# Patient Record
Sex: Female | Born: 1970 | Race: White | Hispanic: No
Health system: Midwestern US, Community
[De-identification: ages and names within clinical notes are randomized; demographics above are authoritative.]

## PROBLEM LIST (undated history)

## (undated) DIAGNOSIS — R51 Headache: Secondary | ICD-10-CM

## (undated) DIAGNOSIS — S335XXA Sprain of ligaments of lumbar spine, initial encounter: Secondary | ICD-10-CM

## (undated) DIAGNOSIS — G894 Chronic pain syndrome: Secondary | ICD-10-CM

## (undated) DIAGNOSIS — I10 Essential (primary) hypertension: Secondary | ICD-10-CM

## (undated) DIAGNOSIS — G43909 Migraine, unspecified, not intractable, without status migrainosus: Secondary | ICD-10-CM

## (undated) DIAGNOSIS — M7918 Myalgia, other site: Secondary | ICD-10-CM

## (undated) DIAGNOSIS — G8929 Other chronic pain: Secondary | ICD-10-CM

## (undated) DIAGNOSIS — M26629 Arthralgia of temporomandibular joint, unspecified side: Secondary | ICD-10-CM

## (undated) DIAGNOSIS — R519 Headache, unspecified: Secondary | ICD-10-CM

## (undated) DIAGNOSIS — M542 Cervicalgia: Secondary | ICD-10-CM

## (undated) DIAGNOSIS — M549 Dorsalgia, unspecified: Secondary | ICD-10-CM

## (undated) DIAGNOSIS — E785 Hyperlipidemia, unspecified: Secondary | ICD-10-CM

## (undated) DIAGNOSIS — M47816 Spondylosis without myelopathy or radiculopathy, lumbar region: Secondary | ICD-10-CM

## (undated) HISTORY — PX: KNEE ARTHROSCOPY: SUR90

## (undated) HISTORY — DX: Hyperlipidemia, unspecified: E78.5

## (undated) HISTORY — PX: ULNAR COLLATERAL LIGAMENT RECONSTRUCTION: SHX2593

## (undated) HISTORY — DX: Headache: R51

## (undated) HISTORY — DX: Chronic pain syndrome: G89.4

## (undated) HISTORY — DX: Arthralgia of temporomandibular joint, unspecified side: M26.629

## (undated) HISTORY — DX: Cervicalgia: M54.2

## (undated) HISTORY — DX: Essential (primary) hypertension: I10

## (undated) HISTORY — DX: Other chronic pain: G89.29

## (undated) HISTORY — PX: OTHER SURGICAL HISTORY: SHX169

## (undated) HISTORY — DX: Spondylosis without myelopathy or radiculopathy, lumbar region: M47.816

## (undated) HISTORY — DX: Dorsalgia, unspecified: M54.9

## (undated) HISTORY — DX: Myalgia, other site: M79.18

## (undated) HISTORY — DX: Headache, unspecified: R51.9

## (undated) HISTORY — DX: Sprain of ligaments of lumbar spine, initial encounter: S33.5XXA

## (undated) HISTORY — DX: Migraine, unspecified, not intractable, without status migrainosus: G43.909

---

## 1997-06-01 ENCOUNTER — Emergency Department (HOSPITAL_COMMUNITY): Admission: EM | Admit: 1997-06-01 | Discharge: 1997-06-01 | Payer: Self-pay | Admitting: Emergency Medicine

## 1997-06-02 ENCOUNTER — Emergency Department (HOSPITAL_COMMUNITY): Admission: EM | Admit: 1997-06-02 | Discharge: 1997-06-02 | Payer: Self-pay | Admitting: Emergency Medicine

## 1997-06-16 ENCOUNTER — Other Ambulatory Visit: Admission: RE | Admit: 1997-06-16 | Discharge: 1997-06-16 | Payer: Self-pay | Admitting: Obstetrics and Gynecology

## 1997-07-21 ENCOUNTER — Emergency Department (HOSPITAL_COMMUNITY): Admission: EM | Admit: 1997-07-21 | Discharge: 1997-07-21 | Payer: Self-pay | Admitting: Emergency Medicine

## 1997-09-11 ENCOUNTER — Emergency Department (HOSPITAL_COMMUNITY): Admission: EM | Admit: 1997-09-11 | Discharge: 1997-09-11 | Payer: Self-pay | Admitting: Internal Medicine

## 1997-10-15 ENCOUNTER — Emergency Department (HOSPITAL_COMMUNITY): Admission: EM | Admit: 1997-10-15 | Discharge: 1997-10-15 | Payer: Self-pay | Admitting: Emergency Medicine

## 1997-12-09 ENCOUNTER — Emergency Department (HOSPITAL_COMMUNITY): Admission: EM | Admit: 1997-12-09 | Discharge: 1997-12-09 | Payer: Self-pay | Admitting: Family Medicine

## 1998-01-04 ENCOUNTER — Emergency Department (HOSPITAL_COMMUNITY): Admission: EM | Admit: 1998-01-04 | Discharge: 1998-01-04 | Payer: Self-pay | Admitting: Emergency Medicine

## 1998-05-26 ENCOUNTER — Emergency Department (HOSPITAL_COMMUNITY): Admission: EM | Admit: 1998-05-26 | Discharge: 1998-05-26 | Payer: Self-pay | Admitting: Emergency Medicine

## 1999-06-04 ENCOUNTER — Ambulatory Visit (HOSPITAL_COMMUNITY): Admission: RE | Admit: 1999-06-04 | Discharge: 1999-06-04 | Payer: Self-pay | Admitting: Obstetrics and Gynecology

## 1999-06-14 ENCOUNTER — Encounter: Admission: RE | Admit: 1999-06-14 | Discharge: 1999-09-12 | Payer: Self-pay | Admitting: Anesthesiology

## 1999-08-27 ENCOUNTER — Emergency Department (HOSPITAL_COMMUNITY): Admission: EM | Admit: 1999-08-27 | Discharge: 1999-08-27 | Payer: Self-pay | Admitting: Emergency Medicine

## 1999-08-31 ENCOUNTER — Encounter: Payer: Self-pay | Admitting: Internal Medicine

## 1999-08-31 ENCOUNTER — Encounter: Payer: Self-pay | Admitting: Orthopedic Surgery

## 1999-08-31 ENCOUNTER — Emergency Department (HOSPITAL_COMMUNITY): Admission: EM | Admit: 1999-08-31 | Discharge: 1999-08-31 | Payer: Self-pay | Admitting: Internal Medicine

## 1999-09-04 ENCOUNTER — Emergency Department (HOSPITAL_COMMUNITY): Admission: EM | Admit: 1999-09-04 | Discharge: 1999-09-04 | Payer: Self-pay | Admitting: Emergency Medicine

## 1999-11-24 ENCOUNTER — Emergency Department (HOSPITAL_COMMUNITY): Admission: EM | Admit: 1999-11-24 | Discharge: 1999-11-24 | Payer: Self-pay

## 1999-12-04 ENCOUNTER — Emergency Department (HOSPITAL_COMMUNITY): Admission: EM | Admit: 1999-12-04 | Discharge: 1999-12-04 | Payer: Self-pay | Admitting: Emergency Medicine

## 1999-12-23 ENCOUNTER — Emergency Department (HOSPITAL_COMMUNITY): Admission: EM | Admit: 1999-12-23 | Discharge: 1999-12-23 | Payer: Self-pay | Admitting: Emergency Medicine

## 2000-01-10 ENCOUNTER — Emergency Department (HOSPITAL_COMMUNITY): Admission: EM | Admit: 2000-01-10 | Discharge: 2000-01-10 | Payer: Self-pay | Admitting: Emergency Medicine

## 2000-01-12 ENCOUNTER — Emergency Department (HOSPITAL_COMMUNITY): Admission: EM | Admit: 2000-01-12 | Discharge: 2000-01-12 | Payer: Self-pay | Admitting: Emergency Medicine

## 2000-01-12 ENCOUNTER — Encounter: Payer: Self-pay | Admitting: Emergency Medicine

## 2000-01-17 ENCOUNTER — Emergency Department (HOSPITAL_COMMUNITY): Admission: EM | Admit: 2000-01-17 | Discharge: 2000-01-17 | Payer: Self-pay | Admitting: Emergency Medicine

## 2000-01-22 ENCOUNTER — Observation Stay (HOSPITAL_COMMUNITY): Admission: AD | Admit: 2000-01-22 | Discharge: 2000-01-23 | Payer: Self-pay | Admitting: Obstetrics and Gynecology

## 2000-01-30 ENCOUNTER — Emergency Department (HOSPITAL_COMMUNITY): Admission: EM | Admit: 2000-01-30 | Discharge: 2000-01-30 | Payer: Self-pay | Admitting: Emergency Medicine

## 2000-02-24 ENCOUNTER — Emergency Department (HOSPITAL_COMMUNITY): Admission: EM | Admit: 2000-02-24 | Discharge: 2000-02-24 | Payer: Self-pay | Admitting: *Deleted

## 2000-03-06 ENCOUNTER — Emergency Department (HOSPITAL_COMMUNITY): Admission: EM | Admit: 2000-03-06 | Discharge: 2000-03-06 | Payer: Self-pay

## 2000-03-15 ENCOUNTER — Emergency Department (HOSPITAL_COMMUNITY): Admission: EM | Admit: 2000-03-15 | Discharge: 2000-03-15 | Payer: Self-pay | Admitting: Emergency Medicine

## 2000-03-16 ENCOUNTER — Emergency Department (HOSPITAL_COMMUNITY): Admission: EM | Admit: 2000-03-16 | Discharge: 2000-03-16 | Payer: Self-pay | Admitting: *Deleted

## 2000-04-27 ENCOUNTER — Emergency Department (HOSPITAL_COMMUNITY): Admission: EM | Admit: 2000-04-27 | Discharge: 2000-04-27 | Payer: Self-pay | Admitting: Emergency Medicine

## 2000-05-09 ENCOUNTER — Emergency Department (HOSPITAL_COMMUNITY): Admission: EM | Admit: 2000-05-09 | Discharge: 2000-05-09 | Payer: Self-pay | Admitting: Emergency Medicine

## 2000-05-28 ENCOUNTER — Emergency Department (HOSPITAL_COMMUNITY): Admission: EM | Admit: 2000-05-28 | Discharge: 2000-05-28 | Payer: Self-pay | Admitting: Emergency Medicine

## 2000-06-14 ENCOUNTER — Emergency Department (HOSPITAL_COMMUNITY): Admission: EM | Admit: 2000-06-14 | Discharge: 2000-06-15 | Payer: Self-pay | Admitting: Emergency Medicine

## 2000-12-05 ENCOUNTER — Emergency Department (HOSPITAL_COMMUNITY): Admission: EM | Admit: 2000-12-05 | Discharge: 2000-12-05 | Payer: Self-pay | Admitting: Emergency Medicine

## 2001-01-08 ENCOUNTER — Emergency Department (HOSPITAL_COMMUNITY): Admission: EM | Admit: 2001-01-08 | Discharge: 2001-01-08 | Payer: Self-pay | Admitting: Emergency Medicine

## 2001-01-18 ENCOUNTER — Other Ambulatory Visit: Admission: RE | Admit: 2001-01-18 | Discharge: 2001-01-18 | Payer: Self-pay | Admitting: Obstetrics and Gynecology

## 2002-04-15 ENCOUNTER — Other Ambulatory Visit: Admission: RE | Admit: 2002-04-15 | Discharge: 2002-04-15 | Payer: Self-pay | Admitting: Obstetrics and Gynecology

## 2002-07-28 ENCOUNTER — Emergency Department (HOSPITAL_COMMUNITY): Admission: EM | Admit: 2002-07-28 | Discharge: 2002-07-28 | Payer: Self-pay | Admitting: *Deleted

## 2002-08-27 ENCOUNTER — Emergency Department (HOSPITAL_COMMUNITY): Admission: EM | Admit: 2002-08-27 | Discharge: 2002-08-27 | Payer: Self-pay | Admitting: Emergency Medicine

## 2002-09-18 ENCOUNTER — Emergency Department (HOSPITAL_COMMUNITY): Admission: EM | Admit: 2002-09-18 | Discharge: 2002-09-18 | Payer: Self-pay | Admitting: Emergency Medicine

## 2002-10-31 ENCOUNTER — Encounter
Admission: RE | Admit: 2002-10-31 | Discharge: 2003-01-29 | Payer: Self-pay | Admitting: Physical Medicine & Rehabilitation

## 2002-11-06 ENCOUNTER — Encounter
Admission: RE | Admit: 2002-11-06 | Discharge: 2002-12-05 | Payer: Self-pay | Admitting: Physical Medicine & Rehabilitation

## 2002-12-16 ENCOUNTER — Encounter: Admission: RE | Admit: 2002-12-16 | Discharge: 2003-03-16 | Payer: Self-pay | Admitting: Orthopedic Surgery

## 2003-03-05 ENCOUNTER — Encounter
Admission: RE | Admit: 2003-03-05 | Discharge: 2003-06-03 | Payer: Self-pay | Admitting: Physical Medicine & Rehabilitation

## 2003-06-11 ENCOUNTER — Encounter
Admission: RE | Admit: 2003-06-11 | Discharge: 2003-09-09 | Payer: Self-pay | Admitting: Physical Medicine & Rehabilitation

## 2003-06-30 ENCOUNTER — Encounter
Admission: RE | Admit: 2003-06-30 | Discharge: 2003-09-28 | Payer: Self-pay | Admitting: Physical Medicine & Rehabilitation

## 2003-08-31 ENCOUNTER — Emergency Department (HOSPITAL_COMMUNITY): Admission: EM | Admit: 2003-08-31 | Discharge: 2003-08-31 | Payer: Self-pay | Admitting: Emergency Medicine

## 2003-09-17 ENCOUNTER — Other Ambulatory Visit: Admission: RE | Admit: 2003-09-17 | Discharge: 2003-09-17 | Payer: Self-pay | Admitting: Obstetrics and Gynecology

## 2003-09-29 ENCOUNTER — Encounter
Admission: RE | Admit: 2003-09-29 | Discharge: 2003-12-28 | Payer: Self-pay | Admitting: Physical Medicine & Rehabilitation

## 2003-10-08 ENCOUNTER — Encounter
Admission: RE | Admit: 2003-10-08 | Discharge: 2004-01-06 | Payer: Self-pay | Admitting: Physical Medicine & Rehabilitation

## 2003-10-12 ENCOUNTER — Ambulatory Visit: Payer: Self-pay | Admitting: Physical Medicine & Rehabilitation

## 2003-11-13 ENCOUNTER — Encounter: Admission: RE | Admit: 2003-11-13 | Discharge: 2004-02-11 | Payer: Self-pay | Admitting: Psychology

## 2003-12-20 ENCOUNTER — Emergency Department (HOSPITAL_COMMUNITY): Admission: EM | Admit: 2003-12-20 | Discharge: 2003-12-20 | Payer: Self-pay | Admitting: Emergency Medicine

## 2003-12-22 ENCOUNTER — Emergency Department (HOSPITAL_COMMUNITY): Admission: EM | Admit: 2003-12-22 | Discharge: 2003-12-23 | Payer: Self-pay | Admitting: Emergency Medicine

## 2004-01-12 ENCOUNTER — Encounter
Admission: RE | Admit: 2004-01-12 | Discharge: 2004-04-11 | Payer: Self-pay | Admitting: Physical Medicine & Rehabilitation

## 2004-01-13 ENCOUNTER — Ambulatory Visit: Payer: Self-pay | Admitting: Physical Medicine & Rehabilitation

## 2004-02-12 ENCOUNTER — Emergency Department (HOSPITAL_COMMUNITY): Admission: EM | Admit: 2004-02-12 | Discharge: 2004-02-12 | Payer: Self-pay | Admitting: Emergency Medicine

## 2004-02-23 ENCOUNTER — Emergency Department (HOSPITAL_COMMUNITY): Admission: EM | Admit: 2004-02-23 | Discharge: 2004-02-24 | Payer: Self-pay | Admitting: Emergency Medicine

## 2004-03-08 ENCOUNTER — Ambulatory Visit: Payer: Self-pay | Admitting: Physical Medicine & Rehabilitation

## 2004-04-10 ENCOUNTER — Emergency Department (HOSPITAL_COMMUNITY): Admission: EM | Admit: 2004-04-10 | Discharge: 2004-04-10 | Payer: Self-pay | Admitting: *Deleted

## 2004-05-05 ENCOUNTER — Encounter
Admission: RE | Admit: 2004-05-05 | Discharge: 2004-08-03 | Payer: Self-pay | Admitting: Physical Medicine & Rehabilitation

## 2004-05-10 ENCOUNTER — Ambulatory Visit: Payer: Self-pay | Admitting: Physical Medicine & Rehabilitation

## 2004-05-12 ENCOUNTER — Emergency Department (HOSPITAL_COMMUNITY): Admission: EM | Admit: 2004-05-12 | Discharge: 2004-05-12 | Payer: Self-pay | Admitting: Emergency Medicine

## 2004-05-16 ENCOUNTER — Emergency Department (HOSPITAL_COMMUNITY): Admission: EM | Admit: 2004-05-16 | Discharge: 2004-05-16 | Payer: Self-pay | Admitting: Emergency Medicine

## 2004-06-28 ENCOUNTER — Emergency Department (HOSPITAL_COMMUNITY): Admission: EM | Admit: 2004-06-28 | Discharge: 2004-06-28 | Payer: Self-pay | Admitting: Emergency Medicine

## 2004-07-14 ENCOUNTER — Emergency Department (HOSPITAL_COMMUNITY): Admission: EM | Admit: 2004-07-14 | Discharge: 2004-07-14 | Payer: Self-pay | Admitting: Emergency Medicine

## 2004-07-18 ENCOUNTER — Emergency Department (HOSPITAL_COMMUNITY): Admission: EM | Admit: 2004-07-18 | Discharge: 2004-07-18 | Payer: Self-pay | Admitting: Emergency Medicine

## 2004-08-11 ENCOUNTER — Encounter
Admission: RE | Admit: 2004-08-11 | Discharge: 2004-11-09 | Payer: Self-pay | Admitting: Physical Medicine & Rehabilitation

## 2004-08-11 ENCOUNTER — Ambulatory Visit: Payer: Self-pay | Admitting: Physical Medicine & Rehabilitation

## 2004-08-29 ENCOUNTER — Emergency Department (HOSPITAL_COMMUNITY): Admission: EM | Admit: 2004-08-29 | Discharge: 2004-08-29 | Payer: Self-pay | Admitting: Emergency Medicine

## 2004-09-15 ENCOUNTER — Emergency Department (HOSPITAL_COMMUNITY): Admission: EM | Admit: 2004-09-15 | Discharge: 2004-09-15 | Payer: Self-pay | Admitting: Emergency Medicine

## 2004-09-29 ENCOUNTER — Emergency Department (HOSPITAL_COMMUNITY): Admission: EM | Admit: 2004-09-29 | Discharge: 2004-09-30 | Payer: Self-pay | Admitting: Emergency Medicine

## 2004-10-08 ENCOUNTER — Emergency Department (HOSPITAL_COMMUNITY): Admission: EM | Admit: 2004-10-08 | Discharge: 2004-10-09 | Payer: Self-pay | Admitting: Emergency Medicine

## 2004-10-11 ENCOUNTER — Ambulatory Visit: Payer: Self-pay | Admitting: Physical Medicine & Rehabilitation

## 2005-01-13 ENCOUNTER — Encounter
Admission: RE | Admit: 2005-01-13 | Discharge: 2005-04-13 | Payer: Self-pay | Admitting: Physical Medicine & Rehabilitation

## 2005-01-13 ENCOUNTER — Ambulatory Visit: Payer: Self-pay | Admitting: Physical Medicine & Rehabilitation

## 2005-02-03 ENCOUNTER — Emergency Department (HOSPITAL_COMMUNITY): Admission: EM | Admit: 2005-02-03 | Discharge: 2005-02-04 | Payer: Self-pay | Admitting: Emergency Medicine

## 2005-03-02 ENCOUNTER — Ambulatory Visit: Payer: Self-pay | Admitting: Physical Medicine & Rehabilitation

## 2005-04-20 ENCOUNTER — Ambulatory Visit: Payer: Self-pay | Admitting: Physical Medicine & Rehabilitation

## 2005-04-20 ENCOUNTER — Encounter
Admission: RE | Admit: 2005-04-20 | Discharge: 2005-07-19 | Payer: Self-pay | Admitting: Physical Medicine & Rehabilitation

## 2005-05-23 ENCOUNTER — Ambulatory Visit: Payer: Self-pay | Admitting: Physical Medicine & Rehabilitation

## 2005-05-24 ENCOUNTER — Ambulatory Visit (HOSPITAL_COMMUNITY)
Admission: RE | Admit: 2005-05-24 | Discharge: 2005-05-24 | Payer: Self-pay | Admitting: Physical Medicine & Rehabilitation

## 2005-06-15 ENCOUNTER — Ambulatory Visit: Payer: Self-pay | Admitting: Physical Medicine & Rehabilitation

## 2005-07-18 ENCOUNTER — Ambulatory Visit: Payer: Self-pay | Admitting: Physical Medicine & Rehabilitation

## 2005-08-16 ENCOUNTER — Encounter
Admission: RE | Admit: 2005-08-16 | Discharge: 2005-11-14 | Payer: Self-pay | Admitting: Physical Medicine & Rehabilitation

## 2005-08-16 ENCOUNTER — Ambulatory Visit: Payer: Self-pay | Admitting: Physical Medicine & Rehabilitation

## 2005-08-24 ENCOUNTER — Ambulatory Visit (HOSPITAL_COMMUNITY)
Admission: RE | Admit: 2005-08-24 | Discharge: 2005-08-24 | Payer: Self-pay | Admitting: Physical Medicine & Rehabilitation

## 2005-10-03 ENCOUNTER — Ambulatory Visit: Payer: Self-pay | Admitting: Physical Medicine & Rehabilitation

## 2005-11-30 ENCOUNTER — Encounter
Admission: RE | Admit: 2005-11-30 | Discharge: 2006-02-28 | Payer: Self-pay | Admitting: Physical Medicine & Rehabilitation

## 2005-11-30 ENCOUNTER — Ambulatory Visit: Payer: Self-pay | Admitting: Physical Medicine & Rehabilitation

## 2005-12-09 ENCOUNTER — Emergency Department (HOSPITAL_COMMUNITY): Admission: EM | Admit: 2005-12-09 | Discharge: 2005-12-09 | Payer: Self-pay | Admitting: Emergency Medicine

## 2006-01-24 ENCOUNTER — Ambulatory Visit: Payer: Self-pay | Admitting: Physical Medicine & Rehabilitation

## 2006-02-23 ENCOUNTER — Encounter
Admission: RE | Admit: 2006-02-23 | Discharge: 2006-05-24 | Payer: Self-pay | Admitting: Physical Medicine & Rehabilitation

## 2006-03-27 ENCOUNTER — Ambulatory Visit: Payer: Self-pay | Admitting: Physical Medicine & Rehabilitation

## 2006-05-24 ENCOUNTER — Encounter
Admission: RE | Admit: 2006-05-24 | Discharge: 2006-08-22 | Payer: Self-pay | Admitting: Physical Medicine & Rehabilitation

## 2006-05-24 ENCOUNTER — Ambulatory Visit: Payer: Self-pay | Admitting: Physical Medicine & Rehabilitation

## 2006-07-12 ENCOUNTER — Ambulatory Visit: Payer: Self-pay | Admitting: Physical Medicine & Rehabilitation

## 2006-08-16 ENCOUNTER — Ambulatory Visit: Payer: Self-pay | Admitting: Physical Medicine & Rehabilitation

## 2006-09-12 ENCOUNTER — Encounter
Admission: RE | Admit: 2006-09-12 | Discharge: 2006-12-11 | Payer: Self-pay | Admitting: Physical Medicine & Rehabilitation

## 2006-09-12 ENCOUNTER — Ambulatory Visit: Payer: Self-pay | Admitting: Physical Medicine & Rehabilitation

## 2006-10-29 ENCOUNTER — Ambulatory Visit: Payer: Self-pay | Admitting: Physical Medicine & Rehabilitation

## 2006-12-06 ENCOUNTER — Ambulatory Visit: Payer: Self-pay | Admitting: Physical Medicine & Rehabilitation

## 2006-12-06 ENCOUNTER — Encounter
Admission: RE | Admit: 2006-12-06 | Discharge: 2007-03-06 | Payer: Self-pay | Admitting: Physical Medicine & Rehabilitation

## 2006-12-18 ENCOUNTER — Encounter: Admission: RE | Admit: 2006-12-18 | Discharge: 2006-12-18 | Payer: Self-pay | Admitting: Cardiology

## 2006-12-27 ENCOUNTER — Ambulatory Visit: Payer: Self-pay | Admitting: Physical Medicine & Rehabilitation

## 2007-02-14 ENCOUNTER — Ambulatory Visit: Payer: Self-pay | Admitting: Physical Medicine & Rehabilitation

## 2007-02-14 ENCOUNTER — Encounter
Admission: RE | Admit: 2007-02-14 | Discharge: 2007-05-15 | Payer: Self-pay | Admitting: Physical Medicine & Rehabilitation

## 2007-04-12 ENCOUNTER — Encounter
Admission: RE | Admit: 2007-04-12 | Discharge: 2007-07-11 | Payer: Self-pay | Admitting: Physical Medicine & Rehabilitation

## 2007-04-12 ENCOUNTER — Ambulatory Visit: Payer: Self-pay | Admitting: Physical Medicine & Rehabilitation

## 2007-05-17 ENCOUNTER — Ambulatory Visit: Payer: Self-pay | Admitting: Physical Medicine & Rehabilitation

## 2007-06-12 ENCOUNTER — Ambulatory Visit: Payer: Self-pay | Admitting: Physical Medicine & Rehabilitation

## 2007-07-10 ENCOUNTER — Ambulatory Visit: Payer: Self-pay | Admitting: Physical Medicine & Rehabilitation

## 2007-08-08 ENCOUNTER — Encounter
Admission: RE | Admit: 2007-08-08 | Discharge: 2007-09-27 | Payer: Self-pay | Admitting: Physical Medicine & Rehabilitation

## 2007-08-09 ENCOUNTER — Ambulatory Visit: Payer: Self-pay | Admitting: Physical Medicine & Rehabilitation

## 2007-09-04 ENCOUNTER — Ambulatory Visit: Payer: Self-pay | Admitting: Physical Medicine & Rehabilitation

## 2007-09-27 ENCOUNTER — Ambulatory Visit: Payer: Self-pay | Admitting: Physical Medicine & Rehabilitation

## 2007-10-22 ENCOUNTER — Ambulatory Visit: Payer: Self-pay | Admitting: Physical Medicine & Rehabilitation

## 2007-10-22 ENCOUNTER — Encounter
Admission: RE | Admit: 2007-10-22 | Discharge: 2008-01-20 | Payer: Self-pay | Admitting: Physical Medicine & Rehabilitation

## 2007-11-06 ENCOUNTER — Encounter
Admission: RE | Admit: 2007-11-06 | Discharge: 2007-11-06 | Payer: Self-pay | Admitting: Physical Medicine & Rehabilitation

## 2007-11-12 ENCOUNTER — Ambulatory Visit: Payer: Self-pay | Admitting: Physical Medicine & Rehabilitation

## 2007-12-10 ENCOUNTER — Ambulatory Visit: Payer: Self-pay | Admitting: Physical Medicine & Rehabilitation

## 2008-01-20 ENCOUNTER — Encounter
Admission: RE | Admit: 2008-01-20 | Discharge: 2008-04-19 | Payer: Self-pay | Admitting: Physical Medicine & Rehabilitation

## 2008-01-21 ENCOUNTER — Ambulatory Visit: Payer: Self-pay | Admitting: Physical Medicine & Rehabilitation

## 2008-02-14 ENCOUNTER — Ambulatory Visit: Payer: Self-pay | Admitting: Physical Medicine & Rehabilitation

## 2008-03-13 ENCOUNTER — Ambulatory Visit: Payer: Self-pay | Admitting: Physical Medicine & Rehabilitation

## 2008-04-14 ENCOUNTER — Ambulatory Visit: Payer: Self-pay | Admitting: Physical Medicine & Rehabilitation

## 2008-05-12 ENCOUNTER — Encounter
Admission: RE | Admit: 2008-05-12 | Discharge: 2008-08-10 | Payer: Self-pay | Admitting: Physical Medicine & Rehabilitation

## 2008-05-19 ENCOUNTER — Ambulatory Visit: Payer: Self-pay | Admitting: Physical Medicine & Rehabilitation

## 2008-06-16 ENCOUNTER — Ambulatory Visit: Payer: Self-pay | Admitting: Physical Medicine & Rehabilitation

## 2008-07-15 ENCOUNTER — Ambulatory Visit: Payer: Self-pay | Admitting: Physical Medicine & Rehabilitation

## 2008-08-06 ENCOUNTER — Encounter
Admission: RE | Admit: 2008-08-06 | Discharge: 2008-11-04 | Payer: Self-pay | Admitting: Physical Medicine & Rehabilitation

## 2008-08-10 ENCOUNTER — Ambulatory Visit: Payer: Self-pay | Admitting: Physical Medicine & Rehabilitation

## 2008-09-04 ENCOUNTER — Ambulatory Visit: Payer: Self-pay | Admitting: Physical Medicine & Rehabilitation

## 2008-10-02 ENCOUNTER — Ambulatory Visit: Payer: Self-pay | Admitting: Physical Medicine & Rehabilitation

## 2008-10-27 ENCOUNTER — Ambulatory Visit: Payer: Self-pay | Admitting: Physical Medicine & Rehabilitation

## 2008-12-23 ENCOUNTER — Encounter
Admission: RE | Admit: 2008-12-23 | Discharge: 2009-01-20 | Payer: Self-pay | Admitting: Physical Medicine & Rehabilitation

## 2008-12-23 ENCOUNTER — Ambulatory Visit: Payer: Self-pay | Admitting: Physical Medicine & Rehabilitation

## 2009-01-20 ENCOUNTER — Ambulatory Visit: Payer: Self-pay | Admitting: Physical Medicine & Rehabilitation

## 2009-02-18 ENCOUNTER — Encounter
Admission: RE | Admit: 2009-02-18 | Discharge: 2009-05-13 | Payer: Self-pay | Admitting: Physical Medicine & Rehabilitation

## 2009-02-19 ENCOUNTER — Ambulatory Visit: Payer: Self-pay | Admitting: Physical Medicine & Rehabilitation

## 2009-03-11 ENCOUNTER — Ambulatory Visit: Payer: Self-pay | Admitting: Physical Medicine & Rehabilitation

## 2009-04-21 ENCOUNTER — Ambulatory Visit: Payer: Self-pay | Admitting: Physical Medicine & Rehabilitation

## 2009-05-13 ENCOUNTER — Encounter
Admission: RE | Admit: 2009-05-13 | Discharge: 2009-05-20 | Payer: Self-pay | Source: Home / Self Care | Admitting: Physical Medicine & Rehabilitation

## 2009-05-20 ENCOUNTER — Ambulatory Visit: Payer: Self-pay | Admitting: Physical Medicine & Rehabilitation

## 2009-08-10 ENCOUNTER — Encounter
Admission: RE | Admit: 2009-08-10 | Discharge: 2009-11-08 | Payer: Self-pay | Admitting: Physical Medicine & Rehabilitation

## 2009-08-19 ENCOUNTER — Ambulatory Visit: Payer: Self-pay | Admitting: Physical Medicine & Rehabilitation

## 2009-09-29 ENCOUNTER — Ambulatory Visit: Payer: Self-pay | Admitting: Physical Medicine & Rehabilitation

## 2009-10-27 ENCOUNTER — Ambulatory Visit: Payer: Self-pay | Admitting: Physical Medicine & Rehabilitation

## 2009-12-06 ENCOUNTER — Encounter
Admission: RE | Admit: 2009-12-06 | Discharge: 2009-12-09 | Payer: Self-pay | Source: Home / Self Care | Attending: Physical Medicine & Rehabilitation | Admitting: Physical Medicine & Rehabilitation

## 2009-12-09 ENCOUNTER — Ambulatory Visit: Payer: Self-pay | Admitting: Physical Medicine & Rehabilitation

## 2010-02-11 ENCOUNTER — Encounter
Admission: RE | Admit: 2010-02-11 | Discharge: 2010-02-22 | Payer: Self-pay | Source: Home / Self Care | Attending: Physical Medicine & Rehabilitation | Admitting: Physical Medicine & Rehabilitation

## 2010-02-18 ENCOUNTER — Ambulatory Visit: Admit: 2010-02-18 | Payer: Self-pay | Admitting: Physical Medicine & Rehabilitation

## 2010-03-07 ENCOUNTER — Ambulatory Visit (HOSPITAL_BASED_OUTPATIENT_CLINIC_OR_DEPARTMENT_OTHER): Payer: 59 | Admitting: Physical Medicine & Rehabilitation

## 2010-03-07 ENCOUNTER — Encounter: Payer: 59 | Attending: Physical Medicine & Rehabilitation

## 2010-03-07 DIAGNOSIS — M51379 Other intervertebral disc degeneration, lumbosacral region without mention of lumbar back pain or lower extremity pain: Secondary | ICD-10-CM | POA: Insufficient documentation

## 2010-03-07 DIAGNOSIS — F329 Major depressive disorder, single episode, unspecified: Secondary | ICD-10-CM | POA: Insufficient documentation

## 2010-03-07 DIAGNOSIS — M549 Dorsalgia, unspecified: Secondary | ICD-10-CM | POA: Insufficient documentation

## 2010-03-07 DIAGNOSIS — M5380 Other specified dorsopathies, site unspecified: Secondary | ICD-10-CM

## 2010-03-07 DIAGNOSIS — M5137 Other intervertebral disc degeneration, lumbosacral region: Secondary | ICD-10-CM | POA: Insufficient documentation

## 2010-03-07 DIAGNOSIS — IMO0001 Reserved for inherently not codable concepts without codable children: Secondary | ICD-10-CM

## 2010-03-07 DIAGNOSIS — M26609 Unspecified temporomandibular joint disorder, unspecified side: Secondary | ICD-10-CM | POA: Insufficient documentation

## 2010-03-07 DIAGNOSIS — M25569 Pain in unspecified knee: Secondary | ICD-10-CM | POA: Insufficient documentation

## 2010-03-07 DIAGNOSIS — IMO0002 Reserved for concepts with insufficient information to code with codable children: Secondary | ICD-10-CM

## 2010-03-07 DIAGNOSIS — G43909 Migraine, unspecified, not intractable, without status migrainosus: Secondary | ICD-10-CM | POA: Insufficient documentation

## 2010-03-07 DIAGNOSIS — M1710 Unilateral primary osteoarthritis, unspecified knee: Secondary | ICD-10-CM

## 2010-03-07 DIAGNOSIS — M542 Cervicalgia: Secondary | ICD-10-CM | POA: Insufficient documentation

## 2010-03-07 DIAGNOSIS — F3289 Other specified depressive episodes: Secondary | ICD-10-CM | POA: Insufficient documentation

## 2010-04-15 NOTE — Assessment & Plan Note (Signed)
Shannon Meadows is back regarding her multiple pain complaints.  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  She is having pain in her left knee and pain in the back radiating into the left leg.  We had done some medial branch blocks in the past as well as an epidural injection and these have been helpful for her.  Left knee is bothering her when she walks as does the back.  Sleep has been a problem.  Her TMJs are bothering her as well.  REVIEW OF SYSTEMS:  Notable for multiple issues including spasms and some dizziness.  She has worked on some weight loss.  She reports nausea, some diarrhea, constipation, and abdominal pain.  Full 12-point review is in the written health history section and chart.  SOCIAL HISTORY:  As noted above.  She is single and living alone, currently working in [place].  PHYSICAL EXAMINATION:  VITAL SIGNS:  Blood pressure is 142/68, pulse 62, and respiratory rate 18.  She is saturating 98% on room air. GENERAL:  The patient is pleasant, alert, and oriented x3.  She has some pain in the low back with pressure along both PSIS areas.  She has more pain with extension and flexion.  Lateral bending and twisting cause some discomfort.  Straight leg testing was positive today on the left, negative on the right.  She does have some hamstring tightness. Patrick's test and Gaenslen's test were negative.  Left knee is painful with passive range of motion with some crepitus noted.  Difficult to assess the swelling today due to her obesity. HEART:  Regular. CHEST:  Clear. ABDOMEN:  Soft and nontender. NEURO:  The patient is alert and appropriate.  ASSESSMENT: 1. Chronic migraine headaches with cervicogenic disk component and     dystonia. 2. Temporomandibular joint dysfunction. 3. Cervical myofascial pain. 4. 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 5. Osteoarthritis of the knee, left greater than right. 6. Morbid obesity. 7. Depression.  PLAN: 1.  Refilled medications today including fentanyl, oxycodone, and     Dilaudid #10, 90, and 6 respectively.  She will be allowed to have     her father pick up prescriptions next month and I will see her back     in about 2 month's for refills. 2. We will find most recent MRI of her back.  We will look at     performing followup injections either medial branch blocks or     epidural steroid injection. 3. Continue weight loss efforts and exercise intolerance. 4. After informed consent, we injected the left knee via anterolateral     approach with 40 mg of Kenalog and 3 mL of 1% lidocaine.     Initially, we injected 2 trigger points in the cervical trapezius     region on the right.     Ranelle Oyster, M.D. Electronically Signed   ZTS/MedQ D:  03/07/2010 11:59:44  T:  03/07/2010 20:21:01  Job #:  [id number]

## 2010-05-05 ENCOUNTER — Encounter: Payer: 59 | Attending: Physical Medicine & Rehabilitation

## 2010-05-05 DIAGNOSIS — G43909 Migraine, unspecified, not intractable, without status migrainosus: Secondary | ICD-10-CM | POA: Insufficient documentation

## 2010-05-05 DIAGNOSIS — M5380 Other specified dorsopathies, site unspecified: Secondary | ICD-10-CM

## 2010-05-05 DIAGNOSIS — G43019 Migraine without aura, intractable, without status migrainosus: Secondary | ICD-10-CM

## 2010-05-05 DIAGNOSIS — F3289 Other specified depressive episodes: Secondary | ICD-10-CM | POA: Insufficient documentation

## 2010-05-05 DIAGNOSIS — M51379 Other intervertebral disc degeneration, lumbosacral region without mention of lumbar back pain or lower extremity pain: Secondary | ICD-10-CM | POA: Insufficient documentation

## 2010-05-05 DIAGNOSIS — M26609 Unspecified temporomandibular joint disorder, unspecified side: Secondary | ICD-10-CM | POA: Insufficient documentation

## 2010-05-05 DIAGNOSIS — M545 Low back pain, unspecified: Secondary | ICD-10-CM

## 2010-05-05 DIAGNOSIS — Q789 Osteochondrodysplasia, unspecified: Secondary | ICD-10-CM

## 2010-05-05 DIAGNOSIS — F329 Major depressive disorder, single episode, unspecified: Secondary | ICD-10-CM | POA: Insufficient documentation

## 2010-05-05 DIAGNOSIS — IMO0001 Reserved for inherently not codable concepts without codable children: Secondary | ICD-10-CM | POA: Insufficient documentation

## 2010-05-05 DIAGNOSIS — M5137 Other intervertebral disc degeneration, lumbosacral region: Secondary | ICD-10-CM | POA: Insufficient documentation

## 2010-05-05 DIAGNOSIS — M1710 Unilateral primary osteoarthritis, unspecified knee: Secondary | ICD-10-CM | POA: Insufficient documentation

## 2010-05-05 DIAGNOSIS — IMO0002 Reserved for concepts with insufficient information to code with codable children: Secondary | ICD-10-CM | POA: Insufficient documentation

## 2010-06-07 NOTE — Assessment & Plan Note (Signed)
Shannon Meadows is back regarding her multiple pain issues.  She has seen Dr.  Brynda Rim in [place] recently after she fell and dislocated her elbow.  She may have had some slight humeral fractures as well.  The care has  been conservative at this point and she feels as if she is improving.  Her left knee has done better since we injected it.  She was using the  brace for a while until she hurt her elbow.  Apparently, she fell after  getting up from the bed one morning.  Pain is an 8/10.  She described  the pain as sharp, stabbing, intermittent, and tingling.  Pain  interferes with general activity, in relations with others, and  enjoyment of life on a moderate-to-severe level.  Sleep is poor, but  varies.  She complains of increased headache recently, particularly  emanating from the right neck.   REVIEW OF SYSTEMS:  Notable for spasm, heavy bleeding, nausea, diarrhea,  constipation, poor appetite, and limb swelling.  Other pertinent  positives are above and full review is in the written health and history  section of the chart.   SOCIAL HISTORY:  The patient is single.  Living alone.  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   PHYSICAL EXAMINATION:  VITAL SIGNS:  Blood pressure 144/82, pulse 81,  respiratory rate 26, and she is sating 96% on room air.  GENERAL:  The patient is pleasant, alert, oriented x3.  She is less  antalgic to left knee, but seems to be walking better today.  Weight is  unchanged.  Left elbow is notable for multiple ecchymoses and bruises.  She has some flexion, but much less than full extension of her elbow  today.  I did not examine this in detail.  Sensory exam is grossly  intact.  Knee is still with mild effusion.  The patient has tightness  along the upper cervical paraspinal/trapezius muscles on the right side  when press reproduced her pain.  It seemed to be worse with flexion and  extension.  HEART:  Regular rate.  CHEST:  Clear.  ABDOMEN:  Soft and  nontender.  NEURO:  Cognitively, she is intact with good insight and awareness.   ASSESSMENT:  1. Chronic migraine headaches with cervicogenic component.  2. Chronic temporomandibular joint pain.  3. Cervical myofascial pain.  4. Lumbar facet disease with radiculopathy.  5. Left knee osteoarthritis and medial meniscal injury.  6. Plantar fasciitis with fibroma.  7. History of depression.  8. Bradycardia.   PLAN:  1. Follow up with Orthopedics regarding recent fall and also about      left knee issues.  I would expect that they will choose a      conservative course at this point until symptoms recur.  Encouraged      weight loss, knee brace wear as able.  2. Injected right cervical paraspinals and upper trapezius x2 with 2      mL of 1% lidocaine trigger point injection.  The patient tolerated      it well.  3. Refilled the patient's pain medications including Fentora lozenges,      #10, 200 mcg; oxycodone 5 mg 1 q.6 h. p.r.n., #90; and fentanyl      patch 100 mcg q.72 h., #10.  4. I released the patient to go back to work on December 27, 2007, if      okay by Orthopedic Surgery as well.  5. We will see her  back in 4-6 weeks in the Nursing Clinic and in 8-10      weeks with me.  UDS was collected today.      Ranelle Oyster, M.D.  Electronically Signed     ZTS/MedQ  D:  12/10/2007 12:50:26  T:  12/11/2007 03:35:32  Job #:  [id number]

## 2010-06-07 NOTE — Assessment & Plan Note (Signed)
HISTORY OF PRESENT ILLNESS:  Shannon Meadows is back regarding her multiple pain  complaints.  I last saw her in December.  Since I last saw her, her  cardiologist diagnosed her with hypoventilation syndrome after she  finally agreed to go for a sleep study.  She is using oxygen at  nighttime now.  She claims that oxygen is making her room form and she  has problems sleeping as a result.  But, in general, she feels that she  has a bit more energy.  Her left knee is giving her problems as well as  her right foot.  She lost her walking boot that she had been given last  year.  She remains on her other medications for pain including Fentanyl,  oxycodone, Fentora.  She is still having work done on her teeth and  plans to see a dentist for follow up in this regard over the next  several months.   REVIEW OF SYSTEMS:  Notable for tremor, tingling, spasms, pain in the  left knee, potential bowel and bladder symptoms, poor appetite,  coughing, breathing and sleeping issues as noted above.   SOCIAL HISTORY:  The patient is single, working full-time as a Engineer, civil (consulting) in  [employer].   PHYSICAL EXAMINATION:  VITAL SIGNS:  Blood pressure 120/75, pulse 80,  respiratory rate 15, saturating 97% on room air.  GENERAL:  The patient's color was noticeably better today.  She is much  pinker than in the past.  Affect is bright and appropriate.  She remains  overweight.  EXTREMITIES:  She is antalgic on the left knee.  There is a mild  effusion noted at the left knee, but nothing traumatic.  She has some  crepitus with some extension, flexion and meniscal maneuvers also.  Positive for medial aspect more than the lateral.  Right foot is notable  for some tenderness along with the metatarsals.  Once again, no swelling  or edema is noted.  NECK/SHOULDERS:  Notable for trigger points in the right upper cervical  paraspinal as well as the trapezius areas right and left.  Motor exam is  stable.  Sensory exam is intact.  Low back  is still somewhat painful  with flexion and extension.  HEART:  Regular.  CHEST:  Clear.   ASSESSMENT:  1. Chronic migraine headaches with cervicogenic component.  2. Chronic TMJ pain.  3. Cervical myofascial pain.  4. Lumbar facet disease with disk disease and radiculopathy as well.  5. History of third and fourth metatarsal stress fractures on the      right.  6. History of depression.  7. History of osteoarthritis bilaterally of the knees, left greater      than right.  8. Obesity.  9. Plantar fasciitis.  10.Bradycardia and hypertension.   PLAN:  1. Through informed consent, we injected the left knee with 40 mg of      Kenalog and 3 cc of 1% lidocaine.  The patient tolerated well.  2. I also injected the left trapezius and right splenius capitus with      2 cc of 1% lidocaine.  The patient tolerated well.  3. Continue oxygen and improved sleep hygiene as above.  4. Refill OxyContin, Fentora and Duragesic patches today.  5. Send patient back to [facility] for another walking boot which she      needs to use when her foot flares up.  6. Improve diet and exercise.  The patient did have a sugared soft  drink with her today which she needs to try to      avoid.  7. I will see her back in three months time with nurse clinic follow      up in one month.      Ranelle Oyster, M.D.  Electronically Signed     ZTS/MedQ  D:  05/17/2007 12:40:44  T:  05/17/2007 13:09:31  Job #:  [id number]   cc:   Dr. Idamae Schuller, M.D.  Fax: [phone]  Email: [email or web address]

## 2010-06-07 NOTE — Assessment & Plan Note (Signed)
Shannon Meadows is back regarding her multiple pain complaints.  She did well with  the medial branch blocks at L3, L4, and L5 on the right.  She is  complaining of some myofascial pain again in her familiar areas along  the neck and shoulder, right more than left.  She has been struggling  with a head cold recently.  She saw Dr. Donnie Aho regarding her bradycardia  and Holter monitor was placed, apparently, although she did not tolerate  the adhesives very well.  Still complains of poor sleep.  She asks why  she is getting sick and tired all the time.   The patient remains on her same pain medications, including:  1. The Fentanyl patch 100 mcg q.72h.  2. Oxycodone 5 mg 1 q.6h p.r.n.  3. Fentora 200 mcg 1 to 2 q. week p.r.n. breakthrough pain.  4. Lyrica twice daily.   REVIEW OF SYSTEMS:  Notable for numbness, tremor, tingling, trouble  walking, spasms.  She has some easy bleeding, diarrhea, vomiting,  nausea, poor appetite, coughing, respiratory symptoms as noted above.   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   PHYSICAL EXAM:  Blood pressure 133/65, pulse 87, respiratory rate 18,  she is satting 96% on room air.  The patient is pleasant, alert and oriented x3.  Affect is generally  bright and appropriate.  She remains overweight.  Coordination is fair.  She has familiar trigger points along the upper  trapezius at the occiput, right more than left.  She also has some pain  along the medial sternocleidomastoid and the medial lateral trapezius on  the right.  Motor sensory exam is normal in the upper extremities today.  Low back exam reveals some pain with flexion and extension, but better  movement overall today.  HEART:  Regular rate.  CHEST:  Clear.   ASSESSMENT:  1. Chronic migraine headaches with cervicogenic component.  2. Chronic temporomandibular joint pain.  3. Cervical myofascial pain.  4. Lumbar facet disease with a history of lumbar  disk disease and      radiculopathy.  5. History of third and fourth metatarsal stress fractures.  6. History of depression.  7. History of osteoarthritis of the left knee and possibly the right.  8. Morbid obesity.  9. Plantar fasciitis.  10.Bradycardia and hypertension.   PLAN:  1. After informed consent, we injected the right trapezius, right      sternocleidomastoid, and right suboccipital areas using 2 mL 1%      lidocaine.  2. I urged the patient to follow through with her sleep study.  I will      not see her back again unless she does so.  3. Refilled OxyContin, Fentora, and Duragesic patches today.  4. Encouraged her to exercise and strengthening, as well as      appropriate diet.  5. I will see her back in about 3 months with nurse clinic followup in      1 month's time.      Ranelle Oyster, M.D.  Electronically Signed     ZTS/MedQ  D:  01/22/2007 12:53:23  T:  01/22/2007 13:11:20  Job #:  [id number]   cc:   Derrill Center, M.D.

## 2010-06-07 NOTE — Assessment & Plan Note (Signed)
Shannon Meadows is back regarding her multiple pain complaints.  She notes her  knee has been acting up a bit recently.  Her main complaints are  occasional headaches and pain in the shoulders.  She has been diagnosed  as a diabetic and has lost weight down to help accommodate for that.  She has also been exercising more.  She is working on [employer].  She has missed her pool since she has been in [place] and looking  for local place to swim as she is taking this up now.  The patient rates  her pain 8-9/10 and described as sharp, dull, stabbing, and aching at  times.  Pain interferes with general activity, relations with others,  enjoyment of life on a moderate level.  Sleep is fair.   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  She is single, but living with her cat at home.   REVIEW OF SYSTEMS:  Notable for weakness, tingling, dizziness, spasms,  depression, high sugars, nausea, vomiting, diarrhea, abdominal pain, and  limb swelling.  Other pertinent positives are above.  Full 14-point  review is in the written health and history section of the chart.   PHYSICAL EXAMINATION:  VITAL SIGNS:  Blood pressure 135/52, pulse is 87,  and respiratory rate 18.  She is sating 95% on room air.  GENERAL:  Weight appears stable.  She is minimally antalgic on the left  knee with gait today.  She continues to have pain in the neck  particularly with flexion, extension, and notable trigger points in  bilateral mid trapezius as well as the right upper trapezius and  splenius capitis region.  She has clicking in bilateral jaws and TMJs.  NEUROLOGIC:  Cognition was good as well as insight awareness, etc.  HEART:  Regular.  CHEST:  Clear.  ABDOMEN:  Soft, nontender.   ASSESSMENT:  1. Chronic migraine headaches with cervicogenic component.  2. Temporomandibular joint dysfunction.  3. Cervical myofascial pain.  4. Lumbar facet disease with radiculopathy.  5. Osteoarthritis of bilateral knees.  6. Plantar fasciitis with fibroma on the feet.  7. History of depression.   PLAN:  1. After informed consent, we injected 3 trigger points in the right      splenius capitis and bilateral trapezius muscles each with 2 mL of      1% lidocaine.  The patient tolerated well.  2. It was too really to repeat any injections on her left knee.  I      really would refrain from another 3-6 months before doing any      further steroid injection.  She needs to work on weight loss,      strengthening, and exercise activity.  I tried to impress that upon      her today.  3. I refilled Fentora patches, #10, Oxycodone 5, #90, and Fentanyl      patch 100 mcg, #10.      Ranelle Oyster, M.D.  Electronically Signed     ZTS/MedQ  D:  08/10/2008 13:42:47  T:  08/11/2008 03:10:04  Job #:  [id number]

## 2010-06-07 NOTE — Assessment & Plan Note (Signed)
Shannon Meadows is back regarding her knee pain in particular.  She rates it 9/10.  She has had no relief with rest.  She was waiting for her brace and  apparently it is not ready.  She has been out of work.  She showed no  improvement, therefore we ordered an MRI of her left knee, which showed  moderate medial compartment degenerative changes with tear of the  posterior horn of the medial meniscus at the meniscal root, minor  meniscal exclusion, and MCL degeneration.  Other knee ligaments were  intact.  She has small joint effusion.  A referral to Dr. Darrelyn Meadows was  made per the patient's request.  The patient states that she is sleeping  poor.  She has had return of her neck symptoms, and she has been sitting  a lot and not up on her feet.  She describes the patient is  intermittent, dull, stabbing, aching, and constant.  Pain interferes  with general activity, in relationship with others, and enjoyment of  life on a moderate level.   REVIEW OF SYSTEMS:  Notable for the above as well as numbness, tingling,  dizziness, spasms, trouble walking, fever, easy bleeding, gout,  diarrhea, constipation, poor appetite, respiratory infection, and limb  swelling.  Other pertinent positives are above.  Full review is in the  written health and history section of the chart.   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   PHYSICAL EXAMINATION:  VITAL SIGNS:  Blood pressure is 124/82, pulse is  88, respiratory rate 16, and she is sating 98% on room air.  GENERAL:  The patient is pleasant, alert, and oriented x3.  She remains  antalgic on the left knee.  She seems to be morbidly obese.  NECK:  Tender, particularly in the cervical paraspinals and mid-  trapezius regions.  Strength is stable except for inhibition to left  knee.  She had positive meniscal sign to left knee and pain with valgus  stress to the left knee.  Sensory exam is intact.  She has mild joint  effusion on the left  knee.  HEART:  Regular.  CHEST:  Clear.  ABDOMEN:  Soft and nontender.   ASSESSMENT:  1. Chronic migraine headaches with cervicogenic component.  2. Chronic temporomandibular joint pain.  3. Cervical myofascial pain.  4. Lumbar facet disease with radiculopathy.  5. Left knee osteoarthritis and medial meniscal injury.  6. Plantar fasciitis and fibroma.  7. History of depression.  8. History of bradycardia.   PLAN:  1. Continue Mobic for now.  2. After informed consent, we injected via medial approach 40 mg of      Kenalog and 3 cc of 1% lidocaine into the knee.  The patient      tolerated it well.  3. Await fitting of brace and Orthopedic followup.  4. Refilled oxycodone, fentanyl patch, and Fentora, post dated November 23, 2007.  5. I will see her back in about a month's time.  Check UDS at that      visit.      Ranelle Oyster, M.D.  Electronically Signed     ZTS/MedQ  D:  11/12/2007 12:39:01  T:  11/13/2007 01:13:41  Job #:  [id number]   cc:   Windy Fast A. Shannon Meadows, M.D.  Fax: [phone]

## 2010-06-07 NOTE — Assessment & Plan Note (Signed)
FOLLOWUP OFFICE VISIT   Kimble is back for follow-up of her pain.  She has had low back pain  recently after sleeping poorly on a soft bed about a week to a week and  a half ago.  She rates her pain an 8-9/10.  She describes it as sharp,  intermittent, stabbing at times.  Pain interferes with general activity,  relationships with others and enjoyment of life on a moderate level.  Feet have been stable.  Left knee has been bit better since we injected  it.  She has had some more TMJ pain on the left as well as some shoulder  discomfort.   SOME MEDICATIONS:  1. Duragesic 100 mcg q 72 hours,  #10.  2. Oxycodone 5 mg 1 q6 hours, #90.  3. Fentora 200 mg 1-2 q week p.r.n.  Patient states that she likes Lyrica as it has helped her migraine  substantially.   SOCIAL HISTORY:  Patient is single, living alone.  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   REVIEW OF SYSTEMS:  Notable for trouble walking, spasms, easy bleeding,  no appetite, nausea, fever.   PHYSICAL EXAMINATION:  VITAL SIGNS:  Blood pressure 123/67, pulse 75,  respirations 18.  She is saturating 96% on room air.  GENERAL:  Patient is pleasant, alert and oriented x3.  Affect is bright.  Gait is generally stable.  Left knee is minimally tender and swollen  today.  She had some pain over the left TMJ which was notable for  clicking as well.  She had notable tender points over the right  trapezius in multiple spots.  Cognition was appropriate.  Weight stable.  HEART:  Regular.  CHEST:  Clear.  ABDOMEN:  Soft, nontender.  LOW BACK:  Tender, somewhat, along the lumbar paraspinals particularly  at the L5-S1 segment.   ASSESSMENT:  1. Chronic migraines with cervicogenic component.  2. TMJ pain.  3. Cervical myofascial pain.  4. Lumbar facets disease.  5. History of third and fourth metatarsal fractures due to stress      reaction.  6. Depression.  7. Left knee osteoarthritis.  8. Plantar  fasciitis bilaterally.   PLAN:  1. Inject the left TMJ with 30 mg of Kenalog and 2cc 1% Lidocaine.  2. Inject multiple trigger points along the right trapezius using 1%      Lidocaine in 1-2cc increments.  3. Refilled Fentora, Duragesic, and Oxycodone today.  4. Patient needs to have a 3D boot fitted for p.r.n. use on the left      leg.  5. Encourage appropriate weight, shoe wear and diet measures.  6. I will see her back in 3 months with nurse clinic follow-up in 1      month.      Ranelle Oyster, M.D.  Electronically Signed     ZTS/MedQ  D:  08/17/2006 16:32:07  T:  08/18/2006 13:51:54  Job #:  [id number]

## 2010-06-07 NOTE — Procedures (Signed)
Shannon Meadows, Shannon Meadows                  ACCOUNT NO.:  [id number]   MEDICAL RECORD NO.:  [id number]          PATIENT TYPE:  REC   LOCATION:  [facility]                          FACILITY:  [facility]   PHYSICIAN:  Erick Colace, M.D.DATE OF BIRTH:  Jul 05, 1970   DATE OF PROCEDURE:  12/07/2006  DATE OF DISCHARGE:                               OPERATIVE REPORT   A 40 year old female with lumbar facet mediated pain.  She has had  prolonged relief from prior lumbar facet medial branch blocks in 2005.  She has had recurrence of right sided low back pain rated at 8/10  interfering with walking and bending and prolonged standing.  She  continues to work 40 hours a week.   PROCEDURE:  Right L5 dorsal ramus injection.  Right L4 medial branch  block.  Right L3 medial branch block.  Right L2 medial branch block  under fluoroscopic guidance.   INDICATIONS:  See above.  Informed consent was obtained after describing  the risks and benefits of the procedure to the patient include bleeding,  bruising, infection, temporary permanent paralysis.  She elects to  proceed and has given written consent.  The patient was placed prone on  the fluoroscopy table.  Betadine prepped and sterile draped.  A 25-gauge  inch and half needle was used to anesthetize skin and subcutaneous  tissue with 1% lidocaine x2 ccs and then a 22 gauge 5 inch spinal needle  was inserted under fluoroscopic guidance starting the right S1 SAP  sacral ala junction.  Bone contact made confirmed with lateral imaging.  Omnipaque 180 x 0.5 cc demonstrated no intravascular uptake of 0.75 cc  of solution containing 1 cc of 4 mg/cc dexamethasone and 2 cc of 2% MPF  lidocaine were injected.  The right L5 SAP transverse process junction  targeted.  Bone contact made confirmed with lateral imaging.  Omnipaque  180 x 0.5 cc demonstrated no intravascular uptake and 0.75 cc of  dexamethasone lidocaine solution injected.  Then the right L4 SAP  transverse  process junction targeted.  Bone contact made confirmed with  lateral imaging.  Omnipaque 180 x 0.5 cc demonstrated no intravascular  uptake and 0.75 cc of dexamethasone lidocaine solution injected.  Then  the right L3 SAP transverse process junction targeted.  Bone contact  made confirmed with lateral imaging.  Omnipaque 180 x 0.5 cc  demonstrated no intravascular uptake and 0.75 cc of dexamethasone  lidocaine solution was injected.  The patient tolerated the procedure  well.  Pre and post injection vitals stable.  Post injection  instructions given.  She will follow up with Dr. Riley Kill.      Erick Colace, M.D.  Electronically Signed     AEK/MEDQ  D:  12/07/2006 14:51:36  T:  12/08/2006 19:59:40  Job:  [id number]

## 2010-06-07 NOTE — Assessment & Plan Note (Signed)
Shannon Meadows is back regarding her pain.  She saw her PCP ultimately regarding  her bradycardia and hypertension and was referred to cardiology.  She  was not happy with her workup there.  Apparently she wore a halter  monitor for a period of time.  She was placed on lisinopril and HCTZ for  her blood pressure and no other plan was given except for recommendation  of sleep apnea study.  Her right knee also began to swell.  She saw  orthopedic surgery locally and x-rays performed which showed some  arthritis left greater than right.  MRIs were ordered and done recently  with results pending.  Apparently some fluid was drained from her right  knee.  She continues to have pain with both knees.  She has familiar  myofascial neck pain as well.  Headaches have been up and down.  Back  continues to both her in particular standing and walking.  She has some  pain radiating to the legs but the back has the most prominent symptom.  We had injected her back 2 or 3 years ago here at the office.  I believe  she had facet blocks at that time.  She remains on Duragesic patch as  well as oxycodone and Fentora for breakthrough pain.  She has some  dizziness still while she is up and moving at work.   The patient rates her pain 8-9 out of 10 and describes as intermittent,  constant, dull, stabbing, sharp.  Pain interferes with her general  activity, relationship with others, enjoyment of life on moderate level.  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   REVIEW OF SYSTEMS:  Notable for confusion, some depression and spasms,  numbness, pain in her knee, right greater than left.  Poor appetite,  diarrhea, nausea, vomiting, easy bleeding.   SOCIAL HISTORY:  The patient is single and working as noted above.  She  does not smoke.   PHYSICAL EXAMINATION:  VITALS:  Blood pressure is 173/94, pulse is 73,  respiratory rate 18. She is satting 100% on room air.  GENERAL:  The patient is generally pleasant,  no acute distress.  EXTREMITIES:  She walks with antalgic gait favoring her right side.  There is some mild peripatellar swelling in the right knee.  Less  swelling is seen on the left side.  She has trigger points in the left  trapezius and right trapezius muscles as well as the right splenius  capitus areas.  Back painful for facet maneuvers and forced extension.  She had less pain with flexion.  Strength was generally 5/5 in both legs  with intact reflexes and century function.  CHEST:  Clear.  HEART:  Regular.  ABDOMEN:  Soft, nontender.  She remains overweight.   ASSESSMENT:  1. Chronic migraine headaches with cervicogenic component.  2. Chronic TMJ pain.  3. Cervical myofascial pain.  4. Lumbar facet disease with history of lumbar disk disease and      radiculopathy.  I believe her lumbar facet disease is more of a      cause of her acute back pain at this point.  5. History of third and fourth metatarsal stress fractures.  6. History of depression.  7. History of osteoarthritis of left knee as well as potentially right      knee.  8. Morbid obesity.  9. Plantar fasciatus.  10.Bradycardia and hypertension.   PLAN:  1. After informed consent we injected bilateral trapezius  muscles in      the right splenius capitus using 2 cc's 1% lidocaine.  The patient      tolerated well.  2. Will make a referral to Dr. Viann Fish who the patient is      familiar with for an evaluation of the patient's bradycardia and      hypertension.  She very well may need a sleep study although apnea      wouldn't account for bradycardia she has experienced during the      day.  I did recommend that she go back on lisinopril and HCTZ for      now as these will not effect her heart rate.  She can discuss      further medication with Dr. Donnie Aho at which point she sees him.  3. Continue Fentora, oxycodone, Duragesic for pain control.  4. Orthopedic follow-up per local team in [place].  5. I will  see her back in 1-2 months time.  Will first send her to Dr.      Wynn Banker for medial branch blocks at L3/L4, L4/L5, L5/S1 on the      right side.      Ranelle Oyster, M.D.  Electronically Signed     ZTS/MedQ  D:  11/27/2006 13:39:32  T:  11/28/2006 07:57:47  Job #:  [id number]   cc:   Derrill Center, MD

## 2010-06-07 NOTE — Assessment & Plan Note (Signed)
Shannon Meadows is back regarding her multiple pain issues.  After I saw her in  July, she has had problems with the left knee with it giving out.  She  has seen an orthopedist locally several times, who states that she has  arthritis in her knee.  They recommended anti-inflammatory.  She has had  a couple of injections apparently without any benefit.  Surgical  treatment was recommended.  She has been out of work for a few weeks now  for rest and has not noticed appreciable difference.  She is not using a  crutch or brace for the leg.  Pain seems to be worse when she bears  weight and is active when she bends the leg.  Pain is diffuse around the  knee, both inferior, medial and laterally and even in the popliteal  fossa.  She continues to have some problems with headache and neck pain.  Headaches are under the fair control, but the neck seems to be flaring  again in her usual areas.  She rates her pain 9/10.  She described as  sharp, stabbing, constant, and aching.  Sleep is poor.   REVIEW OF SYSTEMS:  Notable for numbness, trouble walking, tremor,  tingling, depression, anxiety, easy bleeding, poor appetite, diarrhea,  nausea, and limb swelling.  Full review is in the written health and  history section.   SOCIAL HISTORY:  The patient has been on FMLA recently due to the knee  pain, which does not allow her to tolerate working.   PHYSICAL EXAMINATION:  VITAL SIGNS:  Blood pressure is 153/99, pulse is  101, respiratory rate 18, and she is sating 97% on room air.  GENERAL:  The patient is pleasant, alert, and oriented x3.  Her mood is  good.  She is very antalgic in the left knee and has significant pain  with range of motion and pressure.  She had pain with medial and lateral  stresses on the knee.  No frank instability was seen.  She had some mild  edema medially in appearance.  Motor exam was stable except for pain  inhibition at the knee and shoulders perhaps.  The sensory exam is  normal.   She had familiar trigger points in the cervical paraspinals as  well as the trapezius regions bilaterally.  HEART:  Regular.  CHEST:  Clear.  ABDOMEN:  Soft and nontender.  She remains morbidly overweight.   ASSESSMENT:  1. Chronic migraine headaches with cervicogenic component.  2. Chronic temporomandibular joint pain.  3. Cervical myofascial pain.  4. Lumbar facet disease radiculopathy.  5. History of right third and fourth metatarsal fractures.  6. Depression.  7. Bilateral osteoarthritis of the knees.  8. Morbid obesity.  9. Plantar fasciitis and fibroma.  10.Bradycardia.   PLAN:  1. We will begin the patient on Mobic 15 mg daily for knee pain.  We      will put her in a knee brace.  Observe after 2 weeks of rest and      then if she has no significant improvement, we will schedule an      MRI.  2. Continue fentanyl patch 100 mcg as well as the Fentora for more      severe breakthrough migraine headaches and oxycodone 5 mg every 6      hours for musculoskeletal pain.  3. After informed consent, we injected the trapezius and splenius      capitus muscles each with 2 mL of 1%  lidocaine.  The patient tolerated it well.  4. I will see her back in about 3 weeks' time.      Ranelle Oyster, M.D.  Electronically Signed     ZTS/MedQ  D:  10/22/2007 13:14:04  T:  10/23/2007 02:47:55  Job #:  [id number]

## 2010-06-07 NOTE — Assessment & Plan Note (Signed)
Shannon Meadows is back regarding her multiple pain complaints.  Her knee has been  acting up again over the last few weeks.  Both are bothering her  generally equally.  We injected the right knee in December and the left  knee in October.  He is having some shoulder pain once again as well.  She is working full-time now as an Nutritional therapist and trying to adjust to the  demands of her job.  She is buying her Donzetta Sprung on her own as insurance  will not cover it, and she feels that it is the only thing that rescues  her from migraine-related headaches and keeps her out of the ER.  The  patient rates her pain 8/10, described as sharp, intermittent, stabbing,  constant, aching.  Pain interferes with general activity, relations with  others, and enjoyment of life on a moderate-to-severe level.   REVIEW OF SYSTEMS:  Notable for dizziness, nausea, poor appetite,  otherwise she still gains weight.  I think her overall dietary habits  are poor.  Her Oswestry score is 28% today.   SOCIAL HISTORY:  The patient lives alone, is working as stated above.   PHYSICAL EXAMINATION:  VITAL SIGNS:  Blood pressure is 153/81, pulse 77,  respiratory rate 18.  She is sating 95% on room air.  GENERAL:  The patient is generally pleasant.  She appears to have gained  more weight since I saw her last.  Her color is fair.  MUSCULOSKELETAL:  She has antalgia both legs with range of motion and  crepitus with flexion and extension.  She has notable trigger points in  both trapezius muscles, right more than left, with palpation today over  the mid belly.  Sensory exam is fair.  Posture was fair.  NEUROLOGIC:  Cognitively she is intact.  Normal cranial nerve exam.  HEART:  Regular.  CHEST:  Clear.  ABDOMEN:  Soft, nontender.   ASSESSMENT:  1. Chronic migraines with cervicogenic component.  2. Temporomandibular joint dysfunction.  3. Cervical myofascial pain.  4. Lumbar facet disease with radiculopathy.  5. Bilateral osteoarthritis  of the knees.  6. Plantar fasciitis with a fibroma.  7. History of depression.   PLAN:  1. Discuss appropriate diet and exercise.  We want her to make a      dietary referral.  We keep on talking about the same issues over      and over.  Her knee pain will continue to be a problem unless she      loses pertinent weight in my opinion.  She is really quite young to      be having the amount of injections and interventional treatment at      this point that she has had.  2. I gave her bilateral knee injections via lateral approach today      using 40 mg of Kenalog and 3 mL of 1% lidocaine.  She may not have      another injection in her knees for at least 6 months.  3. I refilled Fentora patches #10, oxycodone 5 mg #90, fentanyl patch      100 mcg #10.  4. After informed consent, I injected the left and right trapezius      muscles with 2 mL of 1% lidocaine.  The patient      tolerated well.  5. We will see her back in 1 month with nursing and in 3 months with      me.  Ranelle Oyster, M.D.  Electronically Signed     ZTS/MedQ  D:  04/14/2008 12:02:53  T:  04/15/2008 16:10:96  Job #:  [id number]

## 2010-06-07 NOTE — Assessment & Plan Note (Signed)
Shannon Meadows is here regarding her multiple pain issues.  She complains most  specifically today of foot pain over her left arch.  She also complains  of some numbness in her right hand on the medial fingers and wrist,  which is transient.  She has noted this for the last week or two.  She  also complaints of her usual tightness and pain in her neck area.  She  is overall liking the Lyrica for her generalized  fibromyalgia/centralized pain.  Remains on Fentora and fentanyl patch  for headaches and generalized pain control as well.   The patient rates her pain as 7-8/10, described as sharp stabbing,  intermittent, constant, and aching.  Pain appears with her general  activity, relations with others, and enjoyment of life on a moderate-to-  severe level.  Sleep is fair.   REVIEW OF SYSTEMS:  Notable for weakness, numbness, tingling, trouble  walking, spasms, fever, nausea, diarrhea, occasional constipation, poor  appetite.  Other pertinent positives are above and full review is in her  14-point review of systems section in the chart.   SOCIAL HISTORY:  The patient is single and working as a Engineer, civil (consulting).   PHYSICAL EXAMINATION:  VITAL SIGNS:  Blood pressure is 162/94, pulse is  78, and respiratory rate 22.  She is saturating 97% on room air.  GENERAL:  The patient is pleasant, alert, and oriented x3.  Color was  good.  She seemed to be more energetic as a whole.  She is antalgic in  the left foot.  She has a nodule over the left plantar arch along the  medial aspect.  The nodule was about 1 cm in diameter.  Area was painful  to touch.  No changes in the skin color or temperature were noted.  She  has some crepitus still in the knees, more so on the left side.  Neck  and shoulder area was notable for myofascial trigger points in the upper  trapezius and mid trapezius areas left more than right today.  NEUROLOGIC:  Motor and sensory exam were within normal limits for both  upper and lower extremities  today.  BACK:  Much painful and tight with lumbar flexion worse so than  extension.  HEART:  Regular.  CHEST:  Clear.  She remains morbidly overweight.   ASSESSMENT:  1. Chronic migraine headaches with cervicogenic.  2. Chronic temporomandibular joint pain.  3. Cervical myofascial pain.  4. Lumbar facet disease and radiculopathy.  5. History of right third and fourth metatarsal fractures.  6. History of depression.  7. History of bilateral osteoarthritis of the knees.  8. Obesity.  9. History of plantar fasciitis, now with plantar fibroma on the left      side.  10.Bradycardia with hypertension.   PLAN:  1. After informed consent, we injected 3 trigger points in the      trapezius muscles, each with 2 mL, 1% lidocaine.  The patient      tolerated well.  2. Regarding her fibroma, we recommended ice and antiinflammatory for      now.  Can consider injections depending on course.  This should      improve somewhat on its own with time and rest.  3. Refilled oxycodone, Duragesic, and fentanyl today #90, #10, and #10      respectively.  4. Regarding her right arm, I think this is likely due to transient      ulnar nerve compression at the elbow.  She needs to  work on better      positioning, straightening of the arm, and then elbow padded.  She      is persistently on the right      elbow at work.  She will call me if symptoms progress.  5. I will see her back in about 3 months with nurse clinic followup in      1 month's time.      Ranelle Oyster, M.D.  Electronically Signed     ZTS/MedQ  D:  08/09/2007 13:46:58  T:  08/10/2007 06:46:31  Job #:  [id number]   cc:   Dr. Derrill Center

## 2010-06-07 NOTE — Assessment & Plan Note (Signed)
Shannon Meadows is back regarding her pain issues.  Dr. Brynda Rim  allowed her to  return to work.  He stated that she had some meniscal issues which they  will follow conservatively for now.  She is complaining of right knee  pain over the last week which has been responsive with conservative  measures including rest.  She rates her pain as 6/10.  Left knee is  actually doing better at this point.  Foot is feeling better as well.  She uses her boot on a p.r.n. basis.  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  She  rates her pain as 6/10.  Describes as sharp and stabbing, sometimes  constant, intermittent, and aching.  Pain interferes with her general  activity, relations with others, enjoyment of life on a moderate-to-  severe level.  Sleep is fair to good.   REVIEW OF SYSTEMS:  Notable for tingling, spasm, thrush, depression,  anxiety, fever, nausea, diarrhea, reflux, and poor appetite.  Other  pertinent positives are above and full review is in the written health  and history section.   SOCIAL HISTORY:  As noted above.  The patient lives alone.   PHYSICAL EXAMINATION:  VITAL SIGNS:  Blood pressure is 149/71, pulse 87,  respiratory rate 18, and she is sating 100% on room air.  GENERAL:  The patient is pleasant, alert, oriented x3.  I thought she  looked fairly bright and appropriate.  She is actually walking much  better than did back in November, but slightly favoring the right knee.  She had some mild pain with flexion and extension and crepitus in either  knee, left more than right felt today.  Sensory exam is grossly intact.  Posture was fair.  HEART:  Rate is regular.  CHEST:  Clear.  ABDOMEN:  Soft and nontender.  EXTREMITIES:  Cognitively, she is intact.   ASSESSMENT:  1. Chronic migraines with cervicogenic component.  2. Chronic temporomandibular joint pain.  3. Cervical myofascial pain.  4. Lumbar facet disease with  radiculopathy.  5. Left knee osteoarthritis, medial meniscal injury.  The patient now      with right knee pain, likely similar nature.  6. Plantar fascitis with fibroma.  7. History of depression.  8. History of bradycardia and hypertension.   PLAN:  1. After informed consent, we injected the right knee via medial      approach with 40 mg of Kenalog and 3 mL of 1% lidocaine.  The area      was prepped with Betadine prior to injection.  The patient      tolerated well.  2. I refilled the patient's Fentora lozenges, #10, 200 mcg; her      oxycodone 5 mg 1 q. 6h., # 90; and fentanyl patch 100 mcg q. 74h.,      #10.  3. Continue working as she is doing.  We again discussed weight loss      with appropriate diet, et Karie Soda.  4. I will see her back in 3 months with nurse clinic followup in 1      month.      Ranelle Oyster, M.D.  Electronically Signed    ZTS/MedQ  D:  01/21/2008 10:09:03  T:  01/22/2008 01:18:19  Job #:  [id number]

## 2010-06-10 NOTE — Op Note (Signed)
[facility]  Patient:    Shannon Meadows, Shannon Meadows                         MRN: [id number] Proc. Date: 06/04/99 Adm. Date:  [id number] Disc. Date: [id number] Attending:  Cordelia Pen Ii CC:         Candy Sledge, M.D.                           Operative Report  PREOPERATIVE DIAGNOSIS:       Endometriosis.  Pelvic pain.  POSTOPERATIVE DIAGNOSIS:      Endometriosis.  Pelvic pain. Pelvic abdominal adhesions.  OPERATION:                    Laparoscopy with ablation of endometriosis and lysis of adhesions.  SURGEON:                      Guy Sandifer. Arleta Creek, M.D.  ASSISTANT:  ANESTHESIA:                   General with endotracheal intubation by Raul Del, M.D.  ESTIMATED BLOOD LOSS:         Drops.  INDICATIONS:                  This patient is a 40 year old single white female, gravida 1, para 0, abortus 1, on birth control pill, and multiple pain medications with a long history of endometriosis and migraine headache who has increasing pelvic pain and dysmenorrhea.  Details have been dictated in the history and physical.  Laparoscopy is discussed.  Possible risks and complications are discussed including, but not limited to, infection, bowel, bladder, or ureteral  damage, bleeding requiring transfusion of blood products with possible transfusion reaction, HIV, and hepatitis acquisition, DVT, PE, and pneumonia, and possible laparotomy.  All questions are answered and consent is signed and on the chart.   FINDINGS:                     Upper abdomen appears grossly normal.  The uterus is normal in size and contour.  Anterior cul-de-sac is clean.  Tubes and ovaries are normal bilaterally.  The right pelvic side wall contains scarification from ablation of endometriosis in the past, but no evidence of active disease.  The patient is status post LUNA and there is a pseudowindow formation lateral to the right uterosacral ligament which  could represent scarification, status post LUNA or endometriosis.  At the medial apex of this finding is some light red punctate lesions possibly consistent with endometriosis.  This well clear of the course f the ureter which is clearly identified.  The left pelvic side wall is clean. The distal cecum is adherent to the right abdominal side wall with filmy adhesions.  The epiploica is adherent to the ileocecal junction and the appendix is presumably retrocecal in that it cannot be visualized under careful inspection.  There is  0.5 cm dark blue implant of presumably endometriosis on the proximal portion of the cecum.  Careful inspection, this extends below the level of the serosa and the bowel.  It is possible this could represent a small hematoma after manipulation of the bowel as well.  DESCRIPTION OF PROCEDURE:     The patient is taken to the operating room and placed in the dorsal supine position.  Her arms are carefully padded with the patient awake to avoid any strain on the arms.  All pressure points are padded.  She then has general anesthesia placed under endotracheal intubation.  She is placed in he dorsal lithotomy position with care being taken to avoid strain on the lower back and the lower extremities.  She is then prepped abdominally and vaginally and straight catheterized.  Hulka tenaculum was placed in the uterus as a manipulator and she was draped in a sterile fashion.  A small infraumbilical incision is made. Towel clips are then used on either side of the umbilicus for elevation and a disposable Veress needle is placed in the peritoneal cavity on the first attempt. The syringe and drop test are both consistent with proper placement.  The pneumoperitoneum under low flow is then used to insufflate 3 liters of gas. The Veress needle is removed and the 12 mm disposable long trocar is then placed on the first attempt without difficulty.  Placement is verified  with the laparoscope and no damage to the surrounding structures is noted.  A small suprapubic and later a right lower quadrant incision is made and the 5 mm nondisposable trocar sleeves are placed under direct visualization without difficulty.  The above findings are noted.  It should be noted that the findings of the appendix are difficult due o the large amount of adipose tissue, but nevertheless proper exposure was obtained to ascertain the above described adhesions.  The questionable area of endometriosis lateral to the right uterosacral ligament close to its cervical insertion is ablated with bipolar cautery.  Again, this is well clear of the course of the ureter.  The adhesions to the right abdominal side wall are taken down sharply.  Hemostasis is obtained with bipolar cautery.  Careful inspection of the questionable implant of endometriosis on the cecum is carried out and it is felt that this is too deep within the wall of the bowel to safely remove without risking bowel perforation.  It is therefore left alone.  The inferior trocar sleeves are removed.  The pneumoperitoneum is reduced, and no bleeding was noted from any site. The pneumoperitoneum is completely reduced and the incisions were closed with a 3-0 Vicryl suture.  One suture is placed in the deeper underlying layers of the umbilicus with care being taken not to pick up any underlying structures.  The kin is then closed with a subcuticular 3-0 Vicryl suture.  Incisions are injected with 0.5% plain Marcaine.  Dressings are applied.  Hulka tenaculum is removed and no  bleeding is noted.  The patient is catheterized a second time for a small amount of clear urine.  The patient is then awakened and taken to the recovery room in stable condition.  All sponge, needle, and instrument counts were correct. DD:  06/04/99 TD:  06/06/99 Job: [id number]

## 2010-06-10 NOTE — Discharge Summary (Signed)
Shannon Meadows, Shannon Meadows                  ACCOUNT NO.:  [id number]   MEDICAL RECORD NO.:  [id number]           PATIENT TYPE:   LOCATION:                               FACILITY:  [facility]   PHYSICIAN:  Geoffry Paradise, M.D.  DATE OF BIRTH:  02-17-1970   DATE OF ADMISSION:  12/22/2003  DATE OF DISCHARGE:                                 DISCHARGE SUMMARY   CHIEF COMPLAINT:  Migraine headaches and chronic pain.   HISTORY OF PRESENT ILLNESS:  Rolla is seen this morning, now only  approximately 6 hours after our practice was called at 2 a.m. to admit her  with intractable headaches and pain after 2 emergency room visits requiring  narcotics.  She has been taken care by a myriad of specialties to include  [facility] in the past, [facility] and most recently, Dr.  Ranelle Oyster with rehab and pain.  He has her under a current regimen,  but her instructions are to come to the emergency room if she is having a  problem and she relates that she is unable to contact him in a timely  fashion with acute problems, resulting in over-utilization of the emergency  room.   EXAMINATION:  At the time of my assessment, she has been in the emergency  room receiving IV fluids, has had a CT of the brain which was normal and  remains with a normal neurologic exam.  She is alert, awake, completely non-  lateralization on exam.  She is in absolutely no distress.  Blood pressure  is stable, she is non-tachycardic with a pulse in the 60s.   ASSESSMENT AND RECOMMENDATIONS:  Clearly, hospitalization meet-needs are not  evident at this time and the chronic pain must be handled as an outpatient.  I had a lengthy discussion with her and offered her several options.  I  offered [facility] to see her before I discharged her, as if indeed  she felt unable to go home with the pain and the need for inpatient  hospitalization; I certainly had no expertise to offer her and narcotics and  IV fluids alone  were not enough.  Options such as D.H.E. infusions were  possible, but she needed neurologic expertise for this.  She opted against  this option, as she felt as though the physicians at [facility] did  not appropriately sympathize with her pain and felt as though she did not  have the pain she related.  She preferred to follow up with Dr. Riley Kill.  I  explained that given the fact that he does not have an inpatient service,  that option is outpatient alone.  She understood this and understands that  she will be released from the emergency room at this time with a neurologic  exam that is normal and truly no evidence of intractable pain objectively at  this time.  While she clearly has problems with intractable headaches and a  real syndrome, I stressed the fact that she needs to continue to work on  this as an outpatient and not over-utilize the emergency room.  I told her  that [facility] do not take care of migraine headaches and  will not be in a position to admit her for this or see her in the emergency  room for this.  Her options will remain Dr. Riley Kill and/or [facility].  I will expedite a visit with Dr. Riley Kill this morning as  promised and she will return home to resume her headache regimen as  prescribed by him.        ___________________________________________  Geoffry Paradise, M.D.   RA/MEDQ  D:  12/23/2003  T:  12/23/2003  Job:  [id number]

## 2010-06-10 NOTE — H&P (Signed)
Shannon Meadows, BRUMMET                  ACCOUNT NO.:  [id number]   MEDICAL RECORD NO.:  [id number]          PATIENT TYPE:  INP   LOCATION:  1845                         FACILITY:  [facility]   PHYSICIAN:  Larina Earthly, M.D.        DATE OF BIRTH:  08-Feb-1970   DATE OF ADMISSION:  12/22/2003  DATE OF DISCHARGE:                                HISTORY & PHYSICAL   REASON FOR HOSPITALIZATION:  Refractory migraine headaches.   HISTORY OF THE PRESENT ILLNESS:  This is a 40 year old obese Caucasian  female who has a history of chronic headaches with TMJ syndrome, cervical  myofascial pain, degenerative joint disease, depression, endometriosis,  questionable irritable bowel syndrome, who is followed by Dr. Ranelle Oyster on a regular basis for her pain management, but also has a history of  seeing Dr. Candy Sledge, who has since left [facility] from a  neurological perspective.  She also follows with Dr. Geoffry Paradise from a  primary care perspective.  She states that she has been out of work and  previously worked as a traveling Orthoptist, however, given her chronic  migraine headaches, she has been unable to return, but had high hopes of  returning in early 2006.  In late November, 2005, she has noted increasing  headaches and has been to the emergency room on 1 or 2 different occasions,  requiring Stadol and Dilaudid for control, however, despite these  interventions, she has continued to have refractory migraine headaches  ranging from as little as 6/10 to 8-9/10 in intensity, despite heavy use of  her usual medications and emergency room narcotics.  She does not note any  increased stressors in her life, nor intake of unusual foods.  She states  that her last menstrual period was on December 04, 2003, on daily oral  contraceptive pills in the setting of her endometriosis.  She also states  that she has been refractory to steroid Dosepaks in the past.  Keppra has  been weaned from  her regimen over the last several months by Dr. Riley Kill.  She does note that she had a similar occurrence of refractory migraine  headaches 1-2 years ago under the care of Dr. Noreene Filbert and she received  intramuscular phenobarbital as well as intravenous D.H.E. with relief,  however, she does not follow up with neurology on a regular basis at this  time.   REVIEW OF SYSTEMS:  Review of systems negative for fevers/chills, positive  for nausea, but no vomiting, negative for shortness of breath, chest pain,  abdominal discomfort, new neurological deficits or musculoskeletal deficits,  negative for change in bowel habits, negative for dysuria.   PROBLEM LIST:  As above.   SOCIAL HISTORY:  The patient is single, previously worked as a traveling  Orthoptist, denies any alcohol or tobacco abuse.   MEDICATIONS:  Medications currently include:  1.  Trazodone 100 mg p.o. nightly p.r.n.  2.  Topamax 200 mg p.o. b.i.d.  3.  Prevacid 30 mg p.o. daily.  4.  Singulair 10 mg p.o. daily.  5.  Zyrtec 10 mg p.o. daily.  6.  OxyIR 5 mg p.o. q.6 h. p.r.n.  7.  Duragesic patch 100 mcg/hr, change every 3 day.  8.  Ortho-Cyclen 1 p.o. daily.  9.  Cymbalta 120 mg p.o. daily.  10. Actiq 400 mcg q.wk p.r.n.  11. Imitrex p.r.n.  12. Phenergan p.r.n.   LABORATORIES:  Labs pending including CMET, CBC and TSH.   PHYSICAL EXAM:  GENERAL:  In general, we have an obese Caucasian female who  is alert and oriented, appropriate and answering all questions, in no  apparent distress, but clearly sensitive to the bright light in the room.  VITAL SIGNS:  Blood pressure 128/70, pulse 56, respirations 18, temperature  97.6 degrees Fahrenheit, 97% oxygen saturation on room air.  HEENT:  Sclerae anicteric.  Extraocular movements are intact.  There are no  oropharyngeal lesions.  NECK:  Neck is supple.  There is no cervical lymphadenopathy.  No carotid  bruits are appreciated.  LUNGS:  Lungs are clear to auscultation  bilaterally.  CARDIOVASCULAR:  Exam reveals a regular rate and rhythm.  ABDOMEN:  Abdominal exam reveals a soft, nontender and non-distended  abdomen.  Bowel sounds are present.  EXTREMITIES:  Extremity exam revealed no edema.  Pedal pulses are intact.  The patient can move all 4 extremities.  NEUROLOGICAL:  Exam is grossly nonfocal.  Face is symmetric.   ASSESSMENT AND PLAN:  1.  Refractory migraine headaches in setting of temporomandibular joint      syndrome and history of injections for the same.  We will continue      Dilaudid, given that there is a history of no help from steroids; we      will supplement with morphine as needed.  We will question neurological      consult with possibly Dr. Marlan Palau per Dr. Lanell Matar discretion      to possibly include the use of phenobarbital and D.H.E. per patient      request.  We will check electrolytes and rule out thyroid abnormality.      We will continue oral contraceptive pills, given that there has not been      any recent change in her dose in the setting of endometriosis and we      will continue her usual home medications and intravenous fluids, given      her mild nausea.  Again, no inciting factors have been elicited from the      patient for probable cause on her current migraine headaches.  Again,      may need consult from Dr.      Riley Kill as well.  2.  Seasonal allergic rhinitis.  We will continue usual home medications      which have resulted in stability of her allergies.      Daleen Bo   RA/MEDQ  D:  12/23/2003  T:  12/23/2003  Job:  [id number]   cc:   Geoffry Paradise, M.D.  [address]  [place]  [place] [address]  Fax: [phone]   Ranelle Oyster, M.D.  [address] [place]  [place] [address]  Fax: [phone]
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
## 2010-06-10 NOTE — Consult Note (Signed)
[facility]  Patient:    Shannon Meadows, Shannon Meadows                         MRN: [id number] Adm. Date:  [id number] Disc. Date: [id number] Attending:  Benny Lennert                          Consultation Report  HISTORY OF PRESENT ILLNESS:  I had the pleasure to see Shannon Meadows in the emergency room today.  Shannon Meadows is a pleasant 40 year old white female who fell and sustained a left elbow dislocation, reduced by Dr. Despina Hick (four days ago).  The patient has had a significant amount of pain since that time, culminating in the inability to withstand the pain.  I have discussed this with her over the phone and asked her to come to the emergency room for further evaluation.  She states she has felt the elbow move in the splint, the splint is very uncomfortable and difficult to manage at the present time.  She notes some decreased refill in her fingers subjectively.  She is a Designer, jewellery.  I have discussed her neck and other complaints.  She notes that she has chronic neck pain and migraine headaches.  She has been on Stadol for this in the past by Dr. Fransisca Connors.  The patient notes that the pain medicine currently is not helping her.  ALLERGIES:  THORAZINE and KEFLEX.  MEDICATIONS:  Prevacid, Topamax, Zyrtec, Celexa, Vioxx, Ortho-Cyclen, verapamil, Sonata.  PAST SURGICAL HISTORY:  Left knee arthroscopy in 1987, TMJ surgery x 4, laparoscopy x 4.  PAST MEDICAL HISTORY:  Migraines, endometriosis, inflammatory bowel syndrome, allergies which are seasonal, and gastroesophageal reflux disease.  SOCIAL HISTORY:  She denies tobacco or ethanol use.  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  PHYSICAL EXAMINATION:  GENERAL:  She is a very pleasant white female, alert and oriented, in no acute distress.  MUSCULOSKELETAL:    The patient has normal capillary refill, normal two-point discrimination, normal FDP, FDS, and extensor function about the fingers,  and normal EPL and FPL function to the thumb.  Her shoulder is nontender.   Her neck has a full range of motion without obvious stepoff or deformity.  The right upper extremity is atraumatic and neurovascularly intact.  LABORATORY DATA:  I have x-rayed the left upper extremity given her complaints of subjective loosening and bone slipping.  X-rays show excellent reduction of the elbow without problems.  I do not see any re-dislocation.  IMPRESSION:  Status post left elbow dislocation, treated with relocation four days ago.  PLAN:  I have discussed the patient and findings.  She is still reduced. Given her splint and the pain she is having associated with it, I have changed the splint today sterilely.  I kept her elbow at 90 and her forearm supinated. She has ecchymosis over her skin but no signs of infection, as you know.  She had a well-padded plaster splint placed without difficulty and was neurovascularly intact after application.  She tolerated the procedure well without difficulty.  I have dispensed Mepergan Fortis to her and asked her to return to see [place] as scheduled on Tuesday to see Dr. Otelia Sergeant.  If there are any problems, she will notify me.  I should note that she was given Demerol 100 mg IM and watched for 15 minutes prior to discharge.  There were no immediate  complications.  I have discussed with her all precautions, etc. DD:  09/04/99 TD:  09/05/99 Job: [id number]

## 2010-07-01 ENCOUNTER — Encounter
Payer: BC Managed Care – PPO | Attending: Physical Medicine & Rehabilitation | Admitting: Physical Medicine & Rehabilitation

## 2010-07-01 ENCOUNTER — Ambulatory Visit: Payer: 59 | Admitting: Physical Medicine & Rehabilitation

## 2010-07-01 DIAGNOSIS — M545 Low back pain, unspecified: Secondary | ICD-10-CM

## 2010-07-01 DIAGNOSIS — M1710 Unilateral primary osteoarthritis, unspecified knee: Secondary | ICD-10-CM

## 2010-07-01 DIAGNOSIS — F3289 Other specified depressive episodes: Secondary | ICD-10-CM | POA: Insufficient documentation

## 2010-07-01 DIAGNOSIS — M5380 Other specified dorsopathies, site unspecified: Secondary | ICD-10-CM

## 2010-07-01 DIAGNOSIS — M26609 Unspecified temporomandibular joint disorder, unspecified side: Secondary | ICD-10-CM | POA: Insufficient documentation

## 2010-07-01 DIAGNOSIS — M542 Cervicalgia: Secondary | ICD-10-CM | POA: Insufficient documentation

## 2010-07-01 DIAGNOSIS — R259 Unspecified abnormal involuntary movements: Secondary | ICD-10-CM | POA: Insufficient documentation

## 2010-07-01 DIAGNOSIS — M5137 Other intervertebral disc degeneration, lumbosacral region: Secondary | ICD-10-CM | POA: Insufficient documentation

## 2010-07-01 DIAGNOSIS — IMO0001 Reserved for inherently not codable concepts without codable children: Secondary | ICD-10-CM

## 2010-07-01 DIAGNOSIS — M51379 Other intervertebral disc degeneration, lumbosacral region without mention of lumbar back pain or lower extremity pain: Secondary | ICD-10-CM | POA: Insufficient documentation

## 2010-07-01 DIAGNOSIS — M25569 Pain in unspecified knee: Secondary | ICD-10-CM | POA: Insufficient documentation

## 2010-07-01 DIAGNOSIS — G43909 Migraine, unspecified, not intractable, without status migrainosus: Secondary | ICD-10-CM | POA: Insufficient documentation

## 2010-07-01 DIAGNOSIS — F329 Major depressive disorder, single episode, unspecified: Secondary | ICD-10-CM | POA: Insufficient documentation

## 2010-07-02 NOTE — Assessment & Plan Note (Signed)
Shannon Meadows is back regarding her multiple pain issues.  Left knee continues to give her problems and has been worsening as of late.  She had good results with the knee injection back in February, although the results of which have since worn off.  Her pain is about 6-8/10 on average.  Low back also remains an ongoing problem.  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  REVIEW OF SYSTEMS:  Notable for the above.  She does report some intermittent diarrhea and constipation.  Limb swelling.  She was hospitalized for respiratory infection or pneumonia back in March or April apparently.  Full 12-point review is in the written health and history section of the chart.  SOCIAL HISTORY:  Unchanged.  The patient lives alone, is working as above.  PHYSICAL EXAMINATION:  VITAL SIGNS:  Blood pressure is 140/80, pulse 52, respiratory rate 18, and she is saturating 97% on room air. GENERAL:  The patient is generally pleasant and alert.  She remains obese. MUSCULOSKELETAL:  She is antalgic on the left side.  She has pain in the lumbar spine with flexion more than extension.  Left knee is notable for crepitus and pain with McMurray testing in the medial compartment.  Mild effusion is noted.  She has trigger points over the bilateral splenius capitis muscles as well as the left trap. HEART:  Regular. CHEST:  Clear. ABDOMEN:  Soft and nontender. NEUROLOGIC:  Cognitively, she is appropriate.  ASSESSMENT: 1. Chronic migraine headaches with cervicogenic disk component and     dystonia. 2. Temporomandibular joint dysfunction. 3. Cervical myofascial pain. 4. 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 5. Osteoarthritis of left knee greater than right with meniscal injury     in the past. 6. Morbid obesity. 7. Depression.  PLAN: 1. After informed consent, I injected the left knee via  medial     approach using 60 mg of Kenalog and 4 mL of 1% lidocaine.  The     patient tolerated it well.  Sterile preparation was used and area     was cleaned and bandaged upon completion. 2. I injected 3 trigger points along the bilateral splenius capitis     and left trapezius.  Each trigger point with 2 mL. 3. Continue weight loss, efforts in diet.  The patient and I had a     heart-to-heart talk about where her priorities are.  This remains a     problem for her. 4. Refilled medications which will get her through the next 2 months.     We will order an MRI of her low back as follow up prior to her     visit.     Ranelle Oyster, M.D. Electronically Signed   ZTS/MedQ D:  07/01/2010 14:04:07  T:  07/02/2010 00:07:58  Job #:  [id number]

## 2010-07-29 ENCOUNTER — Other Ambulatory Visit: Payer: Self-pay | Admitting: Physical Medicine & Rehabilitation

## 2010-07-29 DIAGNOSIS — M545 Low back pain, unspecified: Secondary | ICD-10-CM

## 2010-08-25 ENCOUNTER — Other Ambulatory Visit: Payer: 59

## 2010-08-26 ENCOUNTER — Ambulatory Visit: Payer: 59 | Admitting: Neurosurgery

## 2010-08-26 ENCOUNTER — Ambulatory Visit: Payer: 59 | Admitting: Physical Medicine & Rehabilitation

## 2010-09-07 ENCOUNTER — Ambulatory Visit: Payer: 59 | Admitting: Neurosurgery

## 2010-09-19 ENCOUNTER — Ambulatory Visit
Admission: RE | Admit: 2010-09-19 | Discharge: 2010-09-19 | Disposition: A | Discharge status: Home / Self Care | Payer: BC Managed Care – PPO | Source: Ambulatory Visit | Attending: Physical Medicine & Rehabilitation | Admitting: Physical Medicine & Rehabilitation

## 2010-09-19 ENCOUNTER — Ambulatory Visit: Payer: 59 | Admitting: Neurosurgery

## 2010-09-19 DIAGNOSIS — M545 Low back pain, unspecified: Secondary | ICD-10-CM

## 2010-09-20 ENCOUNTER — Encounter: Payer: BC Managed Care – PPO | Attending: Neurosurgery | Admitting: Neurosurgery

## 2010-09-20 DIAGNOSIS — M26609 Unspecified temporomandibular joint disorder, unspecified side: Secondary | ICD-10-CM | POA: Insufficient documentation

## 2010-09-20 DIAGNOSIS — G8929 Other chronic pain: Secondary | ICD-10-CM | POA: Insufficient documentation

## 2010-09-20 DIAGNOSIS — M542 Cervicalgia: Secondary | ICD-10-CM | POA: Insufficient documentation

## 2010-09-20 DIAGNOSIS — M1710 Unilateral primary osteoarthritis, unspecified knee: Secondary | ICD-10-CM | POA: Insufficient documentation

## 2010-09-20 DIAGNOSIS — IMO0002 Reserved for concepts with insufficient information to code with codable children: Secondary | ICD-10-CM

## 2010-09-20 DIAGNOSIS — G43909 Migraine, unspecified, not intractable, without status migrainosus: Secondary | ICD-10-CM | POA: Insufficient documentation

## 2010-09-20 DIAGNOSIS — M47817 Spondylosis without myelopathy or radiculopathy, lumbosacral region: Secondary | ICD-10-CM | POA: Insufficient documentation

## 2010-09-20 DIAGNOSIS — F329 Major depressive disorder, single episode, unspecified: Secondary | ICD-10-CM

## 2010-09-20 DIAGNOSIS — F3289 Other specified depressive episodes: Secondary | ICD-10-CM

## 2010-09-20 NOTE — Assessment & Plan Note (Signed)
ACCOUNT:  [id number].  This is a patient of Dr. Riley Kill, seen for back issues as well as left knee problems.  She states her back had been worse over time.  She does have some neck issues from time-to-time.  She is traveling Engineer, civil (consulting).  Dr. Riley Kill ordered an MRI at her last appointment, which shows some mild left eccentric disc bulge at 4-5 with annual tear, mild facet arthropathy, and some articular lateral recess stenosis.  She does state that is new and we may be able to work him with Dr. Wynn Banker for an injection there.  She states she was told by him that he can do an ablation at some point if needed.  She rates her average pain at 8-9 which is sharp to dull stabbing pain, it is constant.  General activity level is 7-8.  Pain is worse in the morning.  Sleep patterns are fair. All activities aggravate.  Rest, heat, medication, TENS unit injections tend to help.  She walks without assistance.  She can climb steps and drive.  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  REVIEW OF SYSTEMS:  Notable for those difficulties as well as some weight gain, skin breakdown, easy bleeding, high blood sugar fluctuations, nausea, constipation, limb swelling, spasms, and paresthesias.  No suicidal thoughts or aberrant behaviors.  Her Oswestry score is 48.  PAST MEDICAL HISTORY:  Unchanged.  SOCIAL HISTORY:  Single.  Lives alone.  FAMILY HISTORY:  Unchanged.  PHYSICAL EXAMINATION:  VITAL SIGNS:  Blood pressure is 151/88, pulse 55, respirations 18, O2 sats 97 on room air. NEUROLOGIC:  Her motor strength and sensations were within normal limits in lower extremities.  She is morbidly obese.  She is alert and oriented x3.  She has normal gait.  ASSESSMENT: 1. History of chronic migraines. 2. Cervicalgia. 3. Temporomandibular joint dysfunction. 4. 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 5. Osteoarthritis of left knee, greater than right. 6. Morbid obesity. 7.  Depression.  PLAN: 1. Refill Dilaudid 4 mg one to two p.o. daily p.r.n., #12 with no     refill. 2. Oxycodone 5 mg one p.o. q.8 h, #90 with no refill. 3. Scheduled her to follow up with Dr. Wynn Banker in about 2 months for     possible L4-5 left epidural steroid injection versus ablation. 4. She will follow up for refills after that she states she will be in     [place] and working for the next 3 months.  Otherwise, her     questions were encouraged and answered.  We will see her back in     the clinic as scheduled.     Fradel Baldonado L. Blima Dessert Electronically Signed    RLW/MedQ D:  09/20/2010 10:33:14  T:  09/20/2010 11:15:27  Job #:  [id number]

## 2010-11-17 ENCOUNTER — Encounter: Payer: 59 | Admitting: Physical Medicine & Rehabilitation

## 2010-11-17 ENCOUNTER — Encounter: Payer: 59 | Attending: Neurosurgery

## 2010-11-17 DIAGNOSIS — M26609 Unspecified temporomandibular joint disorder, unspecified side: Secondary | ICD-10-CM | POA: Insufficient documentation

## 2010-11-17 DIAGNOSIS — M47817 Spondylosis without myelopathy or radiculopathy, lumbosacral region: Secondary | ICD-10-CM | POA: Insufficient documentation

## 2010-11-17 DIAGNOSIS — M542 Cervicalgia: Secondary | ICD-10-CM | POA: Insufficient documentation

## 2010-11-17 DIAGNOSIS — M1710 Unilateral primary osteoarthritis, unspecified knee: Secondary | ICD-10-CM | POA: Insufficient documentation

## 2010-11-17 DIAGNOSIS — G43909 Migraine, unspecified, not intractable, without status migrainosus: Secondary | ICD-10-CM | POA: Insufficient documentation

## 2010-11-17 DIAGNOSIS — G8929 Other chronic pain: Secondary | ICD-10-CM | POA: Insufficient documentation

## 2010-11-17 NOTE — Procedures (Signed)
Shannon Meadows, Shannon Meadows                  ACCOUNT NO.:  [id number]  MEDICAL RECORD NO.:  [id number]           PATIENT TYPE:  O  LOCATION:  [facility]PC                          FACILITY:  [facility]  PHYSICIAN:  Erick Colace, M.D.DATE OF BIRTH:  1970-11-14  DATE OF PROCEDURE:  11/17/2010 DATE OF DISCHARGE:                              OPERATIVE REPORT  PROCEDURE:  Right L3 and right L4 medial branch block, right L5 dorsal ramus injection under fluoroscopic guidance.  INDICATIONS:  Lumbar pain with lumbar spondylosis evident on recent MRI on September 19, 2010.  Pain is primarily right-sided.  Informed consent was obtained after describing risks and benefits of the procedure with the patient.  These include bleeding, bruising, and infection.  Elects to proceed and has given written consent.  The patient was placed prone on fluoroscopy table.  Betadine prep, sterile drape, 25-gauge inch and half needle was used to anesthetize skin, subcu tissue 1% lidocaine x2 mL at each of 3 spots.  Then, a 22-gauge, 5 inch spinal needle was inserted under fluoroscopic guidance starting the right S1 SAP sacral ala junction bone contact made.  Omnipaque 180 x 0.5 mL demonstrated no intravascular uptake.  Then, 0.5 mL solution 1 mL of 4 mg/mL dexamethasone 2 mL 2% MPF lidocaine was injected.  Then, the right L5 SAP transverse process junction targeted, bone contact made. Omnipaque 180 x 0.5 mL demonstrated no intravascular uptake.  Then, 0.5 mL of the dexamethasone-lidocaine solution was injected.  Then, the right L4 SAP transverse process junction targeted, bone contact made. Omnipaque 180 x 0.5 mL demonstrated no intravascular uptake.  Then, 0.5 mL of dexamethasone-lidocaine solution injected.  The patient tolerated the procedure well.  Pre and post injection vitals stable.  Follow up with Dr. Riley Kill in 1 month, pain diary given.     Erick Colace, M.D. Electronically Signed    AEK/MEDQ  D:  11/17/2010  10:54:56  T:  11/17/2010 12:03:30  Job:  [id number]

## 2010-12-28 ENCOUNTER — Encounter: Payer: 59 | Attending: Physical Medicine & Rehabilitation | Admitting: Physical Medicine & Rehabilitation

## 2010-12-28 DIAGNOSIS — M542 Cervicalgia: Secondary | ICD-10-CM | POA: Insufficient documentation

## 2010-12-28 DIAGNOSIS — M5380 Other specified dorsopathies, site unspecified: Secondary | ICD-10-CM

## 2010-12-28 DIAGNOSIS — M26609 Unspecified temporomandibular joint disorder, unspecified side: Secondary | ICD-10-CM | POA: Insufficient documentation

## 2010-12-28 DIAGNOSIS — M1710 Unilateral primary osteoarthritis, unspecified knee: Secondary | ICD-10-CM

## 2010-12-28 DIAGNOSIS — D2370 Other benign neoplasm of skin of unspecified lower limb, including hip: Secondary | ICD-10-CM | POA: Insufficient documentation

## 2010-12-28 DIAGNOSIS — M519 Unspecified thoracic, thoracolumbar and lumbosacral intervertebral disc disorder: Secondary | ICD-10-CM | POA: Insufficient documentation

## 2010-12-28 DIAGNOSIS — IMO0001 Reserved for inherently not codable concepts without codable children: Secondary | ICD-10-CM

## 2010-12-28 DIAGNOSIS — G43019 Migraine without aura, intractable, without status migrainosus: Secondary | ICD-10-CM

## 2010-12-28 DIAGNOSIS — G43909 Migraine, unspecified, not intractable, without status migrainosus: Secondary | ICD-10-CM | POA: Insufficient documentation

## 2010-12-28 NOTE — Assessment & Plan Note (Signed)
HISTORY:  Shannon Meadows is back regarding multiple pain complaints.  She had a right medial branch blocks in October and has had good results.  She states that her pain on the right side is really nearly gone, although she is noticing some left-sided symptoms now which are still more mild than the prior right-sided symptoms.  Pain is about 6 -7/10.  Her fibroids over the arches of the feet have become a bit more tender and she has questions about those.  I have filled out some paperwork for her and was not sure how she will complete it.  She brought in fresh paperwork for me to adjust and send to her employer.  She is having increasing trigger points again, they to respond nicely to injections.  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  REVIEW OF SYSTEMS:  Notable for the above.  Full 12-point review is in the written health and history section of the chart.  She does report ongoing left knee pain as well.  SOCIAL HISTORY:  Noted above.  She still lives alone.  PHYSICAL EXAMINATION:  VITAL SIGNS:  Blood pressure 139/68, pulse 66, respiratory rate 16, and she is satting 96% on room air. GENERAL:  The patient is pleasant and alert.  She has a limp on the left side.  We looked at her arches and there are these fibroids and they are along the medial aspect.  DTRs are somewhat tender.  The left is more pronounced than the right.  Back is somewhat tender to the facet maneuvers, but has less irritable today.  She has multiple trigger points again primarily along the trapezius muscles of either side, which reproduce pain with palpation. HEART:  Regular. CHEST:  Clear. ABDOMEN:  Soft, nontender.  ASSESSMENT: 1. History of chronic migraines and cervicalgia with myofascial pain. 2. Temporomandibular joint. 3. 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 4. Osteoarthritis of the left knee greater than right with meniscal     changes. 5. Plantar fibroids.  PLAN: 1. Recommended orthopedic assessment of her fibroids as well as her     left knee for further recommendations. I'm not anxious to inject     her feet. 2. After informed consent, I injected all 4 trigger points along both     trapezius muscles each with 2 mL of 1% lidocaine.  The patient     tolerated well. 3. I refilled the patient's Dilaudid, oxycodone, and fentanyl patches     today.  The oxycodone and fentanyl prescriptions that I do not fill     date of January 11, 2011. 4. Discussed with health hygiene, exercise, etc. 5. We will see her back here in about 2 months.  She may pick up     scripts before her appointment or schedule her appointment sooner     if she wants to come back at that date for the refill.    Shannon Meadows, M.D. Electronically Signed   ZTS/MedQ D:  12/28/2010 10:02:36  T:  12/28/2010 10:52:21  Job #:  [id number]

## 2011-02-28 ENCOUNTER — Encounter: Payer: PRIVATE HEALTH INSURANCE | Attending: Neurosurgery | Admitting: Neurosurgery

## 2011-02-28 DIAGNOSIS — IMO0001 Reserved for inherently not codable concepts without codable children: Secondary | ICD-10-CM

## 2011-02-28 DIAGNOSIS — M5380 Other specified dorsopathies, site unspecified: Secondary | ICD-10-CM | POA: Insufficient documentation

## 2011-02-28 DIAGNOSIS — G43909 Migraine, unspecified, not intractable, without status migrainosus: Secondary | ICD-10-CM | POA: Insufficient documentation

## 2011-02-28 DIAGNOSIS — M542 Cervicalgia: Secondary | ICD-10-CM

## 2011-02-28 DIAGNOSIS — M1710 Unilateral primary osteoarthritis, unspecified knee: Secondary | ICD-10-CM | POA: Insufficient documentation

## 2011-03-01 NOTE — Assessment & Plan Note (Signed)
This is a patient of Dr. Riley Kill with multiple pain complaints.  She rates her average pain as 7.  General activity level 6 or 7.  Pain is same 24 hours a day.  Sleep patterns are fair.  All activities aggravate.  Rest, heat, ice therapy, pacing, medication, injections help.  She walks without assistance, climb steps and drives. Functionally she works 36 hours a week.  REVIEW OF SYSTEMS:  Notable for difficulties as described above as well as some skin breakdown, easy bleeding, poor appetite, nausea, limb swelling, spasms, depression, no suicidal thoughts or aberrant behaviors.  Last pill count, UDS consistent.  Past medical history, social history, and family history are unchanged.  PHYSICAL EXAMINATION:  VITAL SIGNS:  Blood pressure is 146/78, pulse 95, respirations 16, O2 sats 97 on room air. NEUROLOGIC:  Constitutionally, she is morbidly obese.  She is alert and oriented x3.  She has a slight limp.  ASSESSMENT: 1. History of chronic migraines. 2. Lumbar facet disease. 3. Osteoarthritis, left knee.  PLAN: 1. Refill fentanyl 100 mcg 1 transdermally every 72 hours 10 with no     refill. 2. Oxycodone 5 mg 1 p.o. q.8 hours p.r.n. 90 with no refill. 3. Dilaudid 4 mg 1-2 every day as needed, 12 with no refill, and she     was also given a note that she has no active work restrictions.  Her questions were otherwise encouraged and answered.  She will follow up with Dr. Riley Kill in a month.     Icarus Partch L. Blima Dessert Electronically Signed    RLW/MedQ D:  02/28/2011 11:19:33  T:  03/01/2011 03:31:50  Job #:  [id number]

## 2011-04-03 ENCOUNTER — Encounter: Payer: Self-pay | Admitting: Physical Medicine & Rehabilitation

## 2011-04-03 ENCOUNTER — Encounter
Payer: No Typology Code available for payment source | Attending: Physical Medicine & Rehabilitation | Admitting: Physical Medicine & Rehabilitation

## 2011-04-03 DIAGNOSIS — M47816 Spondylosis without myelopathy or radiculopathy, lumbar region: Secondary | ICD-10-CM | POA: Insufficient documentation

## 2011-04-03 DIAGNOSIS — M179 Osteoarthritis of knee, unspecified: Secondary | ICD-10-CM

## 2011-04-03 DIAGNOSIS — M47817 Spondylosis without myelopathy or radiculopathy, lumbosacral region: Secondary | ICD-10-CM

## 2011-04-03 DIAGNOSIS — M170 Bilateral primary osteoarthritis of knee: Secondary | ICD-10-CM | POA: Insufficient documentation

## 2011-04-03 DIAGNOSIS — IMO0002 Reserved for concepts with insufficient information to code with codable children: Secondary | ICD-10-CM

## 2011-04-03 DIAGNOSIS — M797 Fibromyalgia: Secondary | ICD-10-CM | POA: Insufficient documentation

## 2011-04-03 DIAGNOSIS — IMO0001 Reserved for inherently not codable concepts without codable children: Secondary | ICD-10-CM

## 2011-04-03 DIAGNOSIS — M1710 Unilateral primary osteoarthritis, unspecified knee: Secondary | ICD-10-CM

## 2011-04-03 DIAGNOSIS — M26609 Unspecified temporomandibular joint disorder, unspecified side: Secondary | ICD-10-CM

## 2011-04-03 MED ORDER — FENTANYL 50 MCG/HR TD PT72: 1.0000 | MEDICATED_PATCH | TRANSDERMAL | Status: DC

## 2011-04-03 MED ORDER — HYDROMORPHONE HCL 4 MG PO TABS: 4.0000 mg | ORAL_TABLET | Freq: Every day | ORAL | Status: DC | PRN

## 2011-04-03 MED ORDER — OXYCODONE HCL 5 MG PO TABA: 1.0000 | ORAL_TABLET | Freq: Three times a day (TID) | ORAL | Status: DC | PRN

## 2011-04-03 MED ORDER — FENTANYL 50 MCG/HR TD PT72: 1.0000 | MEDICATED_PATCH | TRANSDERMAL | Status: AC

## 2011-04-03 NOTE — Patient Instructions (Signed)
Continue activity and exercise to tolerance.

## 2011-04-03 NOTE — Progress Notes (Signed)
Subjective:    Patient ID: Shannon Meadows, female    DOB: 03-Nov-1970, 41 y.o.   MRN: [id number]  HPI  Shannon Meadows is back regarding her pain.  She tells me she's not changing her patch as often as prescribed, sometimes up to 7 days.  Her job opportunity did not work out because of her work restricitions and pain medications. Knee pain has remained stable and sometimes is inhibiting. She did not follow up with orthopedics in this regard. Her plantar fibroids over the left heart has been stable and not really causing her any pain. She has generalized tenderness in the neck and shoulder girdle musculature. She would like to have some triggerpoints today but is not sure that they're covered by her current insurance.  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 She currently lives in [place].  Pain Inventory Average Pain 6 Pain Right Now 7 My pain is intermittent, constant, sharp, stabbing, tingling and aching  In the last 24 hours, has pain interfered with the following? General activity 5 Relation with others 5 Enjoyment of life 5 What TIME of day is your pain at its worst? daytime Sleep (in general) Fair  Pain is worse with: walking, bending, sitting, inactivity, standing and some activites Pain improves with: rest, heat/ice, pacing activities, medication and injections Relief from Meds: 7  Mobility walk without assistance ability to climb steps?  yes do you drive?  yes Do you have any goals in this area?  yes  Function employed # of hrs/week 36 hours  Neuro/Psych No problems in this area  Prior Studies Any changes since last visit?  no  Physicians involved in your care Any changes since last visit?  no  Review of Systems  Constitutional: Positive for appetite change.  HENT: Negative.   Eyes: Negative.   Respiratory: Negative.   Cardiovascular: Negative.   Gastrointestinal: Negative.   Genitourinary: Negative.   Musculoskeletal: Negative.   Skin: Negative.     Neurological: Negative.   Hematological: Negative.   Psychiatric/Behavioral: Negative.        Objective:   Physical Exam  Constitutional: She is oriented to person, place, and time. She appears well-developed and well-nourished.       obese  HENT:  Head: Normocephalic.  Eyes: EOM are normal. Pupils are equal, round, and reactive to light.  Neck: Normal range of motion.  Cardiovascular: Normal rate and regular rhythm.   Pulmonary/Chest: Effort normal.  Abdominal: Soft.  Musculoskeletal:       Generalized tenderness over the shoulder and cervical paraspinals. She has some limitation of forward flexion and extension. Knees reveal some crepitus and pain with meniscal maneuvers. No instability is seen.  She has some limitation lumbar flexion. She can bend to 60-70 without pain however. Posture is fair.  Left plantar fibroids palpable and about the size of a quarter. It is nontender however.  Neurological: She is alert and oriented to person, place, and time. She has normal strength. No sensory deficit. She displays a negative Romberg sign.  Skin: Skin is warm and dry.  Psychiatric: She has a normal mood and affect. Her speech is normal and behavior is normal. Judgment and thought content normal. Cognition and memory are normal.          Assessment & Plan:  ASSESSMENT:  1. History of chronic migraines and cervicalgia with myofascial pain.  2. Temporomandibular joint dysfunction. 3. 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  4. Osteoarthritis of  the left knee greater than right with meniscal  changes.  5. Plantar fibroids.  PLAN:  1. Consider ortho follow up for her knee issues and left plantar fibroids.  2. Consider f/u TPI's depending on insurance coverage.  3. I will decrease patch to because if she were going to withdrawal, she would have done it on day's 5-7 of the fentanyl . additionally I refilled her Dilaudid 4 mg #12 and oxycodone 5 mg  #90 today. She was given prescriptions for all these medications for an additional month. 4. Discussed with health hygiene, exercise, etc.  5. Nurse will see her back here in about 2 months. I'll see her back in 4 months.  6. I also reviewed her work capacity. 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 At this point it's probably best that she did not seek any employment in the [facility] in [place] given her experience.

## 2011-04-12 ENCOUNTER — Encounter: Payer: Self-pay | Admitting: Physical Medicine & Rehabilitation

## 2011-04-27 ENCOUNTER — Encounter: Payer: Self-pay | Admitting: Physical Medicine & Rehabilitation

## 2011-05-30 ENCOUNTER — Ambulatory Visit: Payer: PRIVATE HEALTH INSURANCE | Admitting: Physical Medicine & Rehabilitation

## 2011-06-13 ENCOUNTER — Telehealth: Payer: Self-pay

## 2011-06-13 ENCOUNTER — Encounter: Payer: 59 | Attending: Physical Medicine & Rehabilitation | Admitting: Physical Medicine & Rehabilitation

## 2011-06-13 ENCOUNTER — Encounter: Payer: Self-pay | Admitting: Physical Medicine & Rehabilitation

## 2011-06-13 VITALS — BP 152/96 | HR 68 | Resp 18 | Ht 69.0 in | Wt 305.0 lb

## 2011-06-13 DIAGNOSIS — IMO0002 Reserved for concepts with insufficient information to code with codable children: Secondary | ICD-10-CM

## 2011-06-13 DIAGNOSIS — M47816 Spondylosis without myelopathy or radiculopathy, lumbar region: Secondary | ICD-10-CM

## 2011-06-13 DIAGNOSIS — M47817 Spondylosis without myelopathy or radiculopathy, lumbosacral region: Secondary | ICD-10-CM | POA: Insufficient documentation

## 2011-06-13 DIAGNOSIS — M1710 Unilateral primary osteoarthritis, unspecified knee: Secondary | ICD-10-CM

## 2011-06-13 DIAGNOSIS — M797 Fibromyalgia: Secondary | ICD-10-CM

## 2011-06-13 DIAGNOSIS — M179 Osteoarthritis of knee, unspecified: Secondary | ICD-10-CM

## 2011-06-13 DIAGNOSIS — M26609 Unspecified temporomandibular joint disorder, unspecified side: Secondary | ICD-10-CM

## 2011-06-13 DIAGNOSIS — IMO0001 Reserved for inherently not codable concepts without codable children: Secondary | ICD-10-CM

## 2011-06-13 MED ORDER — HYDROMORPHONE HCL 4 MG PO TABS: 4.0000 mg | ORAL_TABLET | Freq: Every day | ORAL | Status: DC | PRN

## 2011-06-13 MED ORDER — OXYCODONE HCL 5 MG PO TABA: 1.0000 | ORAL_TABLET | Freq: Three times a day (TID) | ORAL | Status: DC | PRN

## 2011-06-13 MED ORDER — PREGABALIN 300 MG PO CAPS: 300.0000 mg | ORAL_CAPSULE | Freq: Two times a day (BID) | ORAL | Status: DC

## 2011-06-13 MED ORDER — FENTANYL 25 MCG/HR TD PT72: 1.0000 | MEDICATED_PATCH | TRANSDERMAL | Status: DC

## 2011-06-13 MED ORDER — FENTANYL 25 MCG/HR TD PT72: 1.0000 | MEDICATED_PATCH | TRANSDERMAL | Status: AC

## 2011-06-13 NOTE — Progress Notes (Signed)
Subjective:    Patient ID: Shannon Meadows, female    DOB: July 09, 1970, 41 y.o.   MRN: [id number]  HPI  Shannon Meadows is back regarding her pain.  She states things have been going fairly well.  She is using the fentanyl patch only q 4 days. She can tell the difference when the patch has worn off, but not as much as she's had in the past.  She doesn't use her oxycodone every day. She usually uses them after work or when she has off. Her pain is usually worse at the end of her shift or the following morning. She has been picking up more shifts lately. Her most signficant pain today is her shoulder girdle and cerivcal pain.  Her low back bothers her as well. It improved after the [facility] but is beginning to bother her again.   She is currently working in [employer] [place].  She works 4, 12 hour shifts per week.    Pain Inventory Average Pain 5 Pain Right Now 6 My pain is intermittent, constant, sharp, dull, stabbing and aching  In the last 24 hours, has pain interfered with the following? General activity 4 Relation with others 4 Enjoyment of life 4 What TIME of day is your pain at its worst? varies Sleep (in general) Fair  Pain is worse with: walking, sitting, inactivity, standing and some activites Pain improves with: rest, heat/ice, medication, TENS and injections Relief from Meds: 7  Mobility walk without assistance ability to climb steps?  yes do you drive?  yes  Function employed # of hrs/week 48  Neuro/Psych No problems in this area  Prior Studies Any changes since last visit?  no  Physicians involved in your care Any changes since last visit?  no     Review of Systems  Cardiovascular: Positive for leg swelling.  Gastrointestinal: Positive for nausea, diarrhea and constipation.  All other systems reviewed and are negative.       Objective:   Physical Exam  Constitutional: She is oriented to person, place, and time. She appears well-developed and well-nourished.   Morbidly obese  HENT:  Head: Normocephalic and atraumatic.  Eyes: EOM are normal. Pupils are equal, round, and reactive to light.  Cardiovascular: Normal rate.   Pulmonary/Chest: Effort normal and breath sounds normal.  Abdominal: Soft.  Musculoskeletal:       Cervical back: She exhibits decreased range of motion, tenderness, bony tenderness and spasm.       Lumbar back: She exhibits decreased range of motion, tenderness, bony tenderness and pain.       Back:  Neurological: She is alert and oriented to person, place, and time.          Assessment & Plan:  ASSESSMENT:  1. History of chronic migraines and cervicalgia with myofascial pain.  2. Temporomandibular joint dysfunction.  3. 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  4. Osteoarthritis of the left knee greater than right with meniscal  changes.  5. Plantar fibroids.  PLAN:  1. After informed consent , i injected both upper cervical paraspinals and there right trap and left levator scapulae, each with 2cc 1%lidocaine.  Again stressed posture and stretching 2. Left knee brace and weight loss,dietary changes were encouraged..  3. I decreased her patch further to . additionally I refilled her Dilaudid 4 mg #12 and oxycodone 5 mg #90 today. She was given prescriptions for all these medications for an additional month. Would like ulimately for her oxycodone  use to decrease also.  4. Discussed with health hygiene, exercise, etc.  5. Our PA will see her back here in about 2 months. I'll see her back in 4 months.

## 2011-06-13 NOTE — Telephone Encounter (Signed)
Pt did not get lyrica script at her appt.  Please call into pharmacy.

## 2011-06-14 ENCOUNTER — Telehealth: Payer: Self-pay | Admitting: *Deleted

## 2011-06-14 NOTE — Telephone Encounter (Signed)
Verified with Shannon Meadows that she received her Lyrica after visit with Dr Riley Kill yesterday.  The Lyrica was called in to the pharmacy by [facility] CMA.

## 2011-08-11 ENCOUNTER — Encounter: Payer: 59 | Attending: Physical Medicine & Rehabilitation | Admitting: Physical Medicine and Rehabilitation

## 2011-08-11 ENCOUNTER — Encounter: Payer: Self-pay | Admitting: Physical Medicine and Rehabilitation

## 2011-08-11 VITALS — BP 141/86 | HR 67 | Resp 16 | Ht 69.0 in | Wt 314.0 lb

## 2011-08-11 DIAGNOSIS — M1710 Unilateral primary osteoarthritis, unspecified knee: Secondary | ICD-10-CM | POA: Insufficient documentation

## 2011-08-11 DIAGNOSIS — R51 Headache: Secondary | ICD-10-CM

## 2011-08-11 DIAGNOSIS — M797 Fibromyalgia: Secondary | ICD-10-CM

## 2011-08-11 DIAGNOSIS — M47816 Spondylosis without myelopathy or radiculopathy, lumbar region: Secondary | ICD-10-CM

## 2011-08-11 DIAGNOSIS — M179 Osteoarthritis of knee, unspecified: Secondary | ICD-10-CM

## 2011-08-11 DIAGNOSIS — M47817 Spondylosis without myelopathy or radiculopathy, lumbosacral region: Secondary | ICD-10-CM

## 2011-08-11 DIAGNOSIS — IMO0002 Reserved for concepts with insufficient information to code with codable children: Secondary | ICD-10-CM

## 2011-08-11 DIAGNOSIS — M2580 Other specified joint disorders, unspecified joint: Secondary | ICD-10-CM | POA: Insufficient documentation

## 2011-08-11 DIAGNOSIS — G43909 Migraine, unspecified, not intractable, without status migrainosus: Secondary | ICD-10-CM | POA: Insufficient documentation

## 2011-08-11 DIAGNOSIS — M545 Low back pain, unspecified: Secondary | ICD-10-CM

## 2011-08-11 DIAGNOSIS — M519 Unspecified thoracic, thoracolumbar and lumbosacral intervertebral disc disorder: Secondary | ICD-10-CM | POA: Insufficient documentation

## 2011-08-11 DIAGNOSIS — IMO0001 Reserved for inherently not codable concepts without codable children: Secondary | ICD-10-CM

## 2011-08-11 DIAGNOSIS — D2370 Other benign neoplasm of skin of unspecified lower limb, including hip: Secondary | ICD-10-CM | POA: Insufficient documentation

## 2011-08-11 DIAGNOSIS — M26609 Unspecified temporomandibular joint disorder, unspecified side: Secondary | ICD-10-CM | POA: Insufficient documentation

## 2011-08-11 DIAGNOSIS — M542 Cervicalgia: Secondary | ICD-10-CM | POA: Insufficient documentation

## 2011-08-11 MED ORDER — OXYCODONE HCL 5 MG PO TABA: 1.0000 | ORAL_TABLET | Freq: Three times a day (TID) | ORAL | Status: DC | PRN

## 2011-08-11 MED ORDER — PREGABALIN 300 MG PO CAPS: 300.0000 mg | ORAL_CAPSULE | Freq: Two times a day (BID) | ORAL | Status: DC

## 2011-08-11 MED ORDER — FENTANYL 25 MCG/HR TD PT72: 1.0000 | MEDICATED_PATCH | TRANSDERMAL | Status: DC

## 2011-08-11 MED ORDER — HYDROMORPHONE HCL 4 MG PO TABS: 4.0000 mg | ORAL_TABLET | Freq: Every day | ORAL | Status: DC | PRN

## 2011-08-11 NOTE — Progress Notes (Signed)
Subjective:    Patient ID: Shannon Meadows, female    DOB: [birth year], 41 y.o.   MRN: [id number]  HPI Shannon Meadows is back regarding her pain. She states things have been going fairly well. She is using the fentanyl patch only q 4 days. She can tell the difference when the patch has worn off, but not as much as she's had in the past. She doesn't use her oxycodone every day. She usually uses them after work or when she has off. Her pain is usually worse at the end of her shift or the following morning. The patient states that she would like to have a trigger point injection into her neck and a steroid injection into her left knee which has helped in the past.  Pain Inventory Average Pain 5 Pain Right Now 5 My pain is constant, sharp, burning, dull, stabbing, tingling and aching  In the last 24 hours, has pain interfered with the following? General activity 4 Relation with others 5 Enjoyment of life 5 What TIME of day is your pain at its worst? morning Sleep (in general) Fair  Pain is worse with: walking, sitting, inactivity, standing and some activites Pain improves with: rest, heat/ice, medication and injections Relief from Meds: 3  Mobility walk without assistance ability to climb steps?  yes do you drive?  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  yes  Neuro/Psych numbness tingling spasms  Prior Studies Any changes since last visit?  no  Physicians involved in your care Any changes since last visit?  no   Family History  Problem Relation Age of Onset  . Heart disease    . Lung disease    . Diabetes    . Hypertension    . Hyperlipidemia Father   . Hypertension Father   . Arthritis Father   . Heart disease Father   . Asthma Mother   . Diabetes Mother   . Arthritis Mother   . Heart disease Mother    History   Social History  . Marital Status: Single    Spouse Name: N/A    Number of Children: N/A  . Years of Education:  N/A   Social History Main Topics  . Smoking status: Never Smoker   . Smokeless tobacco: Never Used  . Alcohol Use: None  . Drug Use: None  . Sexually Active: None   Other Topics Concern  . None   Social History Narrative  . None   Past Surgical History  Procedure Date  . Ulnar collateral ligament reconstruction   . Laporoscopic surgery     UTEREINE  . Knee arthroscopy    Past Medical History  Diagnosis Date  . Chronic headaches   . TMJ syndrome   . Back pain   . Lumbar sprain   . Migraines   . Myofascial pain   . Chronic pain syndrome   . Neck pain   . Endometriosis   . Cervicalgia   . Facet syndrome, lumbar    BP 141/86  Pulse 67  Resp 16  Ht 5\' 9"  (1.753 m)  Wt 314 lb (142.429 kg)  BMI 46.37 kg/m2  SpO2 91%     Review of Systems  Constitutional: Positive for unexpected weight change.  Gastrointestinal: Positive for nausea and vomiting.  Neurological: Positive for numbness.  All other systems reviewed and are negative.       Objective:   Physical Exam Constitutional: She  is oriented to person, place, and time. She appears well-developed and well-nourished.  Morbidly obese  HENT:  Head: Normocephalic and atraumatic.  Eyes: EOM are normal. Pupils are equal, round, and reactive to light.  Cardiovascular: Normal rate.  Pulmonary/Chest: Effort normal and breath sounds normal.  Abdominal: Soft.  Musculoskeletal:  Cervical back: She exhibits decreased range of motion, tenderness, bony tenderness and spasm.  Lumbar back: She exhibits decreased range of motion, tenderness, bony tenderness and pain.        Assessment & Plan:  1. History of chronic migraines and cervicalgia with myofascial pain.  2. Temporomandibular joint dysfunction.  3. 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  4. Osteoarthritis of the left knee greater than right with meniscal  changes.  5. Plantar fibroids.  PLAN:  1. After informed consent , i  injected both upper  trap,  with 2cc 1%lidocaine, and left knee joint with steroid (1) and lidocaine(4) .  Again stressed posture and stretching  2. Left knee brace and weight loss,dietary changes were encouraged..  3. Refilled Fentanyl patch . additionally I refilled her Dilaudid 4 mg #12 and oxycodone 5 mg #90 today. She was given prescriptions for all these medications for an additional month. Would like ulimately for her oxycodone use to decrease also.  4. Discussed with health hygiene, exercise, etc.

## 2011-08-11 NOTE — Patient Instructions (Signed)
Advised patient to look into exercising in a pool,if possible. Try to loose more weight.

## 2011-10-09 ENCOUNTER — Encounter: Payer: Self-pay | Admitting: Physical Medicine & Rehabilitation

## 2011-10-09 ENCOUNTER — Encounter: Payer: 59 | Attending: Physical Medicine & Rehabilitation | Admitting: Physical Medicine & Rehabilitation

## 2011-10-09 VITALS — BP 157/89 | HR 65 | Resp 14 | Ht 69.0 in | Wt 311.8 lb

## 2011-10-09 DIAGNOSIS — IMO0002 Reserved for concepts with insufficient information to code with codable children: Secondary | ICD-10-CM | POA: Insufficient documentation

## 2011-10-09 DIAGNOSIS — M797 Fibromyalgia: Secondary | ICD-10-CM

## 2011-10-09 DIAGNOSIS — M179 Osteoarthritis of knee, unspecified: Secondary | ICD-10-CM

## 2011-10-09 DIAGNOSIS — G43519 Persistent migraine aura without cerebral infarction, intractable, without status migrainosus: Secondary | ICD-10-CM | POA: Insufficient documentation

## 2011-10-09 DIAGNOSIS — IMO0001 Reserved for inherently not codable concepts without codable children: Secondary | ICD-10-CM | POA: Insufficient documentation

## 2011-10-09 DIAGNOSIS — M47817 Spondylosis without myelopathy or radiculopathy, lumbosacral region: Secondary | ICD-10-CM | POA: Insufficient documentation

## 2011-10-09 DIAGNOSIS — M47816 Spondylosis without myelopathy or radiculopathy, lumbar region: Secondary | ICD-10-CM

## 2011-10-09 DIAGNOSIS — M1710 Unilateral primary osteoarthritis, unspecified knee: Secondary | ICD-10-CM

## 2011-10-09 DIAGNOSIS — M26609 Unspecified temporomandibular joint disorder, unspecified side: Secondary | ICD-10-CM

## 2011-10-09 MED ORDER — FROVATRIPTAN SUCCINATE 2.5 MG PO TABS: 2.5000 mg | ORAL_TABLET | ORAL | Status: DC | PRN

## 2011-10-09 MED ORDER — HYDROMORPHONE HCL 4 MG PO TABS: 4.0000 mg | ORAL_TABLET | Freq: Every day | ORAL | Status: DC | PRN

## 2011-10-09 MED ORDER — OXYCODONE HCL 5 MG PO TABA: 1.0000 | ORAL_TABLET | Freq: Three times a day (TID) | ORAL | Status: DC | PRN

## 2011-10-09 MED ORDER — PREGABALIN 300 MG PO CAPS: 300.0000 mg | ORAL_CAPSULE | Freq: Two times a day (BID) | ORAL | Status: DC

## 2011-10-09 NOTE — Progress Notes (Signed)
Subjective:    Patient ID: Shannon Meadows, female    DOB: [birth year], 41 y.o.   MRN: [id number]  HPI  Shannon Meadows is back regarding her multiple complaints. At our last visit we reduced her patch. She's not chnaging it on schedule and usually only changing it q4 days. She notices a difference only in her knees. Her head and low back is about the same.    Migraines are taking place about once per week. They seem to be manageable with her OXY IR and dilaudid. She has been through a Chief Operating Officer of tryptans but we she has not tried Frova before.  As far as the left knee is concerned, it has been bothering her more, usually when she's worked four days in a row. She has been using more motrin to get her through work. She uses her braces when she is up working to help control pain. The knee will frequently swell and it frequently "clicks".     Pain Inventory Average Pain 7 Pain Right Now 5 My pain is sharp, burning, dull, stabbing and aching  In the last 24 hours, has pain interfered with the following? General activity 5 Relation with others 5 Enjoyment of life 5 What TIME of day is your pain at its worst? daytime Sleep (in general) Poor  Pain is worse with: sitting, inactivity and some activites Pain improves with: rest, heat/ice, medication, TENS and injections Relief from Meds: 5  Mobility walk without assistance ability to climb steps?  yes do you drive?  yes  Function employed # of hrs/week 36-48  Neuro/Psych numbness tingling spasms  Prior Studies Any changes since last visit?  no  Physicians involved in your care Any changes since last visit?  no   Family History  Problem Relation Age of Onset  . Heart disease    . Lung disease    . Diabetes    . Hypertension    . Hyperlipidemia Father   . Hypertension Father   . Arthritis Father   . Heart disease Father   . Asthma Mother   . Diabetes Mother   . Arthritis Mother   . Heart disease Mother    History    Social History  . Marital Status: Single    Spouse Name: N/A    Number of Children: N/A  . Years of Education: N/A   Social History Main Topics  . Smoking status: Never Smoker   . Smokeless tobacco: Never Used  . Alcohol Use: None  . Drug Use: None  . Sexually Active: None   Other Topics Concern  . None   Social History Narrative  . None   Past Surgical History  Procedure Date  . Ulnar collateral ligament reconstruction   . Laporoscopic surgery     UTEREINE  . Knee arthroscopy    Past Medical History  Diagnosis Date  . Chronic headaches   . TMJ syndrome   . Back pain   . Lumbar sprain   . Migraines   . Myofascial pain   . Chronic pain syndrome   . Neck pain   . Endometriosis   . Cervicalgia   . Facet syndrome, lumbar    BP 157/89  Pulse 65  Resp 14  Wt 311 lb 12.8 oz (141.432 kg)  SpO2 99%    Review of Systems  Cardiovascular: Positive for leg swelling.  Gastrointestinal: Positive for nausea, diarrhea and constipation.  Musculoskeletal: Positive for back pain.  Skin: Positive for rash.  Neurological:  Positive for numbness.       Tingling  All other systems reviewed and are negative.       Objective:   Physical Exam  Constitutional: She is oriented to person, place, and time. She appears well-developed and well-nourished.  Morbidly obese  HENT:  Head: Normocephalic and atraumatic.  Eyes: EOM are normal. Pupils are equal, round, and reactive to light.  Cardiovascular: Normal rate.  Pulmonary/Chest: Effort normal and breath sounds normal.  Abdominal: Soft.  Musculoskeletal:  Cervical back: She exhibits decreased range of motion, tenderness, bony tenderness and spasm.  Lumbar back: She exhibits decreased range of motion, tenderness, bony tenderness and pain.  Back:  Neurological: She is alert and oriented to person, place, and time.    Assessment & Plan:   ASSESSMENT:  1. History of chronic migraines and cervicalgia with myofascial  pain.  2. Temporomandibular joint dysfunction.  3. 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  4. Osteoarthritis of the left knee greater than right with meniscal. Chondromalacia patellae changes. Needs ortho follow 5. Plantar fibroids.   PLAN:  1. After informed consent , i injected both upper cervical paraspinals and there right and left trap  Again stressed posture and stretching  2. Left knee brace and weight loss,dietary changes were encouraged..  3. Dc fentanyl patch. I refilled her Dilaudid 4 mg #12 and oxycodone 5 mg #90 today. She was given prescriptions for all these medications for an additional month.  4. Will trial frova for rescue migraine rx. 2.5 qday prn as opposed to narcs. 5. After informed consent i injected the left knee via lateral approach with 40mg  methylprednisolone and 3 cc 1 percent lidocaine. 5. Our PA will see her back here in about 2 months. I'll see her back in 4 months.

## 2011-10-09 NOTE — Patient Instructions (Signed)
Call me with problems and questions

## 2011-10-20 ENCOUNTER — Telehealth: Payer: Self-pay | Admitting: *Deleted

## 2011-10-20 ENCOUNTER — Telehealth: Payer: Self-pay | Admitting: Physical Medicine & Rehabilitation

## 2011-10-20 NOTE — Telephone Encounter (Signed)
Needs a 14 day supply of Lyrica sent to her pharmacy. She is waiting on her mail order to come in.  This was called in to a [employer].

## 2011-10-20 NOTE — Telephone Encounter (Signed)
Both of these refills have been taken care of.

## 2011-10-20 NOTE — Telephone Encounter (Signed)
14 day rx of Lyrica to [facility] in [place].  Refill to [facility] [phone], fax [phone].  EXB#[id number] group # [id number]

## 2011-10-30 ENCOUNTER — Other Ambulatory Visit: Payer: Self-pay | Admitting: *Deleted

## 2011-10-30 MED ORDER — PREGABALIN 300 MG PO CAPS: 300.0000 mg | ORAL_CAPSULE | Freq: Two times a day (BID) | ORAL | Status: DC

## 2011-10-30 NOTE — Telephone Encounter (Signed)
Patient needs Lyrica refill.  Has this been taken care of?

## 2011-12-05 ENCOUNTER — Telehealth: Payer: Self-pay

## 2011-12-05 DIAGNOSIS — M797 Fibromyalgia: Secondary | ICD-10-CM

## 2011-12-05 DIAGNOSIS — M179 Osteoarthritis of knee, unspecified: Secondary | ICD-10-CM

## 2011-12-05 DIAGNOSIS — G43519 Persistent migraine aura without cerebral infarction, intractable, without status migrainosus: Secondary | ICD-10-CM

## 2011-12-05 DIAGNOSIS — M1710 Unilateral primary osteoarthritis, unspecified knee: Secondary | ICD-10-CM

## 2011-12-05 DIAGNOSIS — M26609 Unspecified temporomandibular joint disorder, unspecified side: Secondary | ICD-10-CM

## 2011-12-05 DIAGNOSIS — M47816 Spondylosis without myelopathy or radiculopathy, lumbar region: Secondary | ICD-10-CM

## 2011-12-05 MED ORDER — HYDROMORPHONE HCL 4 MG PO TABS: 4.0000 mg | ORAL_TABLET | Freq: Every day | ORAL | Status: DC | PRN

## 2011-12-05 MED ORDER — FROVATRIPTAN SUCCINATE 2.5 MG PO TABS: 2.5000 mg | ORAL_TABLET | ORAL | Status: DC | PRN

## 2011-12-05 MED ORDER — OXYCODONE HCL 5 MG PO TABA: 1.0000 | ORAL_TABLET | Freq: Three times a day (TID) | ORAL | Status: DC | PRN

## 2011-12-05 NOTE — Telephone Encounter (Signed)
Patient had to reschedule appt until Dec, she would like to have her father come pick up her oxycodone and dilaudid RX.

## 2011-12-05 NOTE — Telephone Encounter (Signed)
Rx printed for Clydie Braun to sign. Notified Harriett Sine.

## 2011-12-08 ENCOUNTER — Encounter: Payer: 59 | Admitting: Physical Medicine and Rehabilitation

## 2011-12-28 ENCOUNTER — Telehealth: Payer: Self-pay

## 2011-12-28 NOTE — Telephone Encounter (Signed)
Patient called to let us know that she is sending a medical release form for a new job.  She needs to make sure it says she has NO restrictions for this job.  Please call patient when ready.

## 2012-01-02 ENCOUNTER — Telehealth: Payer: Self-pay

## 2012-01-02 NOTE — Telephone Encounter (Signed)
Alinda Money with employee health called regarding forms that were sent for employment for this patient.  She would like a call to clarify some things.  [phone]

## 2012-01-03 NOTE — Telephone Encounter (Signed)
Left message for Shannon Meadows to call office.

## 2012-01-05 ENCOUNTER — Encounter: Payer: 59 | Attending: Physical Medicine and Rehabilitation | Admitting: Physical Medicine and Rehabilitation

## 2012-01-05 ENCOUNTER — Encounter: Payer: Self-pay | Admitting: Physical Medicine and Rehabilitation

## 2012-01-05 VITALS — BP 150/77 | HR 79 | Resp 14 | Ht 69.0 in | Wt 313.0 lb

## 2012-01-05 DIAGNOSIS — M1710 Unilateral primary osteoarthritis, unspecified knee: Secondary | ICD-10-CM | POA: Insufficient documentation

## 2012-01-05 DIAGNOSIS — M179 Osteoarthritis of knee, unspecified: Secondary | ICD-10-CM

## 2012-01-05 DIAGNOSIS — D2370 Other benign neoplasm of skin of unspecified lower limb, including hip: Secondary | ICD-10-CM | POA: Insufficient documentation

## 2012-01-05 DIAGNOSIS — M26609 Unspecified temporomandibular joint disorder, unspecified side: Secondary | ICD-10-CM

## 2012-01-05 DIAGNOSIS — Z5181 Encounter for therapeutic drug level monitoring: Secondary | ICD-10-CM

## 2012-01-05 DIAGNOSIS — M549 Dorsalgia, unspecified: Secondary | ICD-10-CM

## 2012-01-05 DIAGNOSIS — M47817 Spondylosis without myelopathy or radiculopathy, lumbosacral region: Secondary | ICD-10-CM

## 2012-01-05 DIAGNOSIS — R51 Headache: Secondary | ICD-10-CM | POA: Insufficient documentation

## 2012-01-05 DIAGNOSIS — M797 Fibromyalgia: Secondary | ICD-10-CM

## 2012-01-05 DIAGNOSIS — IMO0002 Reserved for concepts with insufficient information to code with codable children: Secondary | ICD-10-CM

## 2012-01-05 DIAGNOSIS — M47816 Spondylosis without myelopathy or radiculopathy, lumbar region: Secondary | ICD-10-CM

## 2012-01-05 DIAGNOSIS — IMO0001 Reserved for inherently not codable concepts without codable children: Secondary | ICD-10-CM | POA: Insufficient documentation

## 2012-01-05 MED ORDER — OXYCODONE HCL 5 MG PO TABA: 1.0000 | ORAL_TABLET | Freq: Three times a day (TID) | ORAL | Status: DC | PRN

## 2012-01-05 MED ORDER — HYDROMORPHONE HCL 4 MG PO TABS: 4.0000 mg | ORAL_TABLET | Freq: Every day | ORAL | Status: DC | PRN

## 2012-01-05 NOTE — Patient Instructions (Signed)
Continue with staying active, continue with stretching exercises.

## 2012-01-05 NOTE — Telephone Encounter (Signed)
Tried to contact Shannon Meadows again but no answer.

## 2012-01-05 NOTE — Progress Notes (Signed)
Subjective:    Patient ID: Shannon Meadows, female    DOB: 1970/03/01, 41 y.o.   MRN: [id number]  HPI The patient complains about headaches, low back pain and some knee pain.  The patient denies any radiation.  The problem has been stable . She states things have been going fairly well. She is using the fentanyl patch only q 4 days.  She doesn't use her oxycodone every day. She usually uses them after work or when she has off. Her pain is usually worse at the end of her shift or the following morning.  Pain Inventory Average Pain 6 Pain Right Now 5 My pain is intermittent, constant, sharp, dull, stabbing and aching  In the last 24 hours, has pain interfered with the following? General activity 5 Relation with others 5 Enjoyment of life 5 What TIME of day is your pain at its worst? morning Sleep (in general) Fair  Pain is worse with: walking, bending, sitting, inactivity, standing and some activites Pain improves with: rest, heat/ice, medication, TENS and injections Relief from Meds: 6  Mobility walk without assistance ability to climb steps?  no do you drive?  yes Do you have any goals in this area?  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  yes  Neuro/Psych spasms  Prior Studies Any changes since last visit?  no  Physicians involved in your care Any changes since last visit?  no   Family History  Problem Relation Age of Onset  . Heart disease    . Lung disease    . Diabetes    . Hypertension    . Hyperlipidemia Father   . Hypertension Father   . Arthritis Father   . Heart disease Father   . Asthma Mother   . Diabetes Mother   . Arthritis Mother   . Heart disease Mother    History   Social History  . Marital Status: Single    Spouse Name: N/A    Number of Children: N/A  . Years of Education: N/A   Social History Main Topics  . Smoking status: Never Smoker   . Smokeless tobacco: Never Used  . Alcohol Use: None   . Drug Use: None  . Sexually Active: None   Other Topics Concern  . None   Social History Narrative  . None   Past Surgical History  Procedure Date  . Ulnar collateral ligament reconstruction   . Laporoscopic surgery     UTEREINE  . Knee arthroscopy    Past Medical History  Diagnosis Date  . Chronic headaches   . TMJ syndrome   . Back pain   . Lumbar sprain   . Migraines   . Myofascial pain   . Chronic pain syndrome   . Neck pain   . Endometriosis   . Cervicalgia   . Facet syndrome, lumbar   . Hypertension    BP 150/77  Pulse 79  Resp 14  Ht 5\' 9"  (1.753 m)  Wt 313 lb (141.976 kg)  BMI 46.22 kg/m2  SpO2 99%  LMP 12/21/2011     Review of Systems  Constitutional: Positive for appetite change.  Gastrointestinal: Positive for nausea, diarrhea and constipation.  Musculoskeletal: Positive for back pain.       Objective:   Physical Exam  Constitutional: She is oriented to person, place, and time. She appears well-developed and well-nourished.       Morbidly obese  HENT:  Head: Normocephalic.  Neck: Neck supple.  Neurological: She is alert and oriented to person, place, and time.  Skin: Skin is warm and dry.  Psychiatric: She has a normal mood and affect.    Symmetric normal motor tone is noted throughout. Normal muscle bulk. Muscle testing reveals 5/5 muscle strength of the upper extremity, and 5/5 of the lower extremity. Full range of motion in upper and lower extremities. ROM of spine is restricted. Fine motor movements are normal in both hands. Sensory is intact and symmetric to light touch, pinprick and proprioception. DTR in the upper and lower extremity are present and symmetric 2+. No clonus is noted.  Patient arises from chair with mild difficulty. Wide based gait with normal arm swing bilateral , able to walk on heels and toes . Tandem walk is stable. No pronator drift. Rhomberg negative.        Assessment & Plan:  1. History of chronic  migraines and cervicalgia with myofascial pain.  2. Temporomandibular joint dysfunction.  3. 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  4. Osteoarthritis of the left knee greater than right with meniscal  changes.  5. Plantar fibroids.  PLAN:  1. Weight loss,dietary changes and exercising were encouraged..  2. I refilled her Dilaudid 4 mg #12 and oxycodone 5 mg #90 today. She was given prescriptions for all these medications for an additional month. Would like ulimately for her oxycodone use to decrease also.  3. Follow up in 2 month.

## 2012-03-04 ENCOUNTER — Encounter: Payer: Medicare HMO | Attending: Physical Medicine and Rehabilitation | Admitting: Physical Medicine & Rehabilitation

## 2012-03-04 ENCOUNTER — Encounter: Payer: Self-pay | Admitting: Physical Medicine & Rehabilitation

## 2012-03-04 VITALS — BP 149/87 | HR 74 | Resp 14 | Ht 69.0 in | Wt 324.0 lb

## 2012-03-04 DIAGNOSIS — IMO0002 Reserved for concepts with insufficient information to code with codable children: Secondary | ICD-10-CM | POA: Insufficient documentation

## 2012-03-04 DIAGNOSIS — M797 Fibromyalgia: Secondary | ICD-10-CM

## 2012-03-04 DIAGNOSIS — M47816 Spondylosis without myelopathy or radiculopathy, lumbar region: Secondary | ICD-10-CM

## 2012-03-04 DIAGNOSIS — M47817 Spondylosis without myelopathy or radiculopathy, lumbosacral region: Secondary | ICD-10-CM | POA: Insufficient documentation

## 2012-03-04 DIAGNOSIS — IMO0001 Reserved for inherently not codable concepts without codable children: Secondary | ICD-10-CM | POA: Insufficient documentation

## 2012-03-04 DIAGNOSIS — M179 Osteoarthritis of knee, unspecified: Secondary | ICD-10-CM

## 2012-03-04 DIAGNOSIS — M26609 Unspecified temporomandibular joint disorder, unspecified side: Secondary | ICD-10-CM | POA: Insufficient documentation

## 2012-03-04 DIAGNOSIS — G43109 Migraine with aura, not intractable, without status migrainosus: Secondary | ICD-10-CM | POA: Insufficient documentation

## 2012-03-04 DIAGNOSIS — M1710 Unilateral primary osteoarthritis, unspecified knee: Secondary | ICD-10-CM

## 2012-03-04 MED ORDER — OXYCODONE HCL 5 MG PO TABA: 1.0000 | ORAL_TABLET | Freq: Three times a day (TID) | ORAL | Status: DC | PRN

## 2012-03-04 MED ORDER — HYDROMORPHONE HCL 4 MG PO TABS: 4.0000 mg | ORAL_TABLET | Freq: Every day | ORAL | Status: DC | PRN

## 2012-03-04 MED ORDER — FROVATRIPTAN SUCCINATE 2.5 MG PO TABS: 2.5000 mg | ORAL_TABLET | ORAL | Status: DC | PRN

## 2012-03-04 NOTE — Progress Notes (Signed)
Subjective:    Patient ID: Shannon Meadows, female    DOB: 01-17-1971, 42 y.o.   MRN: [id number]  HPI  Shannon Meadows is here in follow up of her chronic pain. She reports recurrent cervicalgia. She also has had a few more migraines than usual. She had to leave work one time with nausea and vomitting. Typically she has 10-12 a month. For the most part she is able to funciton and work through them.  Her knee has held up fairly well. Sometimes she wears her brace. She hasn't had any problems off the fentanyl patch.   She needs refills on her dilaudid and oxycodone.   She is now in a permanent spot in [id number] [facility] [place]. She is on her feet a lot. She typically works four 12 hour shifts per week. She likes her current role.   Pain Inventory Average Pain 5 Pain Right Now 4 My pain is intermittent, sharp, burning, stabbing, tingling and aching  In the last 24 hours, has pain interfered with the following? General activity 4 Relation with others 4 Enjoyment of life 4 What TIME of day is your pain at its worst? varies Sleep (in general) Poor  Pain is worse with: walking, bending, sitting, inactivity, standing and some activites Pain improves with: rest, heat/ice, pacing activities, medication, TENS and injections Relief from Meds: 7  Mobility walk without assistance ability to climb steps?  yes do you drive?  yes Do you have any goals in this area?  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  yes  Neuro/Psych No problems in this area  Prior Studies Any changes since last visit?  no  Physicians involved in your care Any changes since last visit?  no   Family History  Problem Relation Age of Onset  . Heart disease    . Lung disease    . Diabetes    . Hypertension    . Hyperlipidemia Father   . Hypertension Father   . Arthritis Father   . Heart disease Father   . Asthma Mother   . Diabetes Mother   . Arthritis Mother   . Heart disease Mother     History   Social History  . Marital Status: Single    Spouse Name: N/A    Number of Children: N/A  . Years of Education: N/A   Social History Main Topics  . Smoking status: Never Smoker   . Smokeless tobacco: Never Used  . Alcohol Use: None  . Drug Use: None  . Sexually Active: None   Other Topics Concern  . None   Social History Narrative  . None   Past Surgical History  Procedure Laterality Date  . Ulnar collateral ligament reconstruction    . Laporoscopic surgery      UTEREINE  . Knee arthroscopy     Past Medical History  Diagnosis Date  . Chronic headaches   . TMJ syndrome   . Back pain   . Lumbar sprain   . Migraines   . Myofascial pain   . Chronic pain syndrome   . Neck pain   . Endometriosis   . Cervicalgia   . Facet syndrome, lumbar   . Hypertension    BP 149/87  Pulse 74  Resp 14  Ht 5\' 9"  (1.753 m)  Wt 324 lb (146.965 kg)  BMI 47.82 kg/m2  SpO2 99%  LMP 02/16/2012     Review of Systems  HENT: Positive for neck pain.   Gastrointestinal: Positive for nausea, vomiting, diarrhea and constipation.  Musculoskeletal: Positive for back pain.  All other systems reviewed and are negative.       Objective:   Physical Exam  Constitutional: She is oriented to person, place, and time. She appears well-developed and well-nourished.  Morbidly obese  HENT:  Head: Normocephalic and atraumatic.  Eyes: EOM are normal. Pupils are equal, round, and reactive to light.  Cardiovascular: Normal rate.  Pulmonary/Chest: Effort normal and breath sounds normal.  Abdominal: Soft.  Musculoskeletal:  Cervical back: She exhibits decreased range of motion, tenderness, bony tenderness and spasm.  Lumbar back: She exhibits decreased range of motion, tenderness, bony tenderness and pain. Particular spasms and Tp's are seen in the mid trap and mid spenius capitis.  Back:  Neurological: She is alert and oriented to person, place, and time. Gait is improved. Less  antalgia seen.  Assessment & Plan:   ASSESSMENT:  1. History of chronic migraines with aura and cervicalgia with myofascial pain.  2. Temporomandibular joint dysfunction.  3. 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  4. Osteoarthritis of the left knee greater than right with meniscal. Chondromalacia patellae  changes. Needs ortho follow  5. Plantar fibroids.  PLAN:  1. After informed consent , i injected both splenius capitis muscles and her right and left trap with 2cc of 1% lidocaine. Again stressed posture and stretching especially during her long shifts at work. 2. Left knee brace and weight loss,dietary changes were encouraged once again. 3.  . I refilled her Dilaudid 4 mg #12 and oxycodone 5 mg #90 today. She was given prescriptions for all these medications for an additional month.  4. Will again rx frova for rescue migraine rx. 2.5 qday prn---she states she has insurance covg now.  5. Our PA will see her back here in about 2 months. I'll see her back in 4 months.

## 2012-03-04 NOTE — Patient Instructions (Signed)
CALL ME WITH QUESTIONS 

## 2012-05-03 ENCOUNTER — Encounter: Payer: Medicare HMO | Attending: Physical Medicine and Rehabilitation | Admitting: Physical Medicine and Rehabilitation

## 2012-05-03 ENCOUNTER — Encounter: Payer: Self-pay | Admitting: Physical Medicine and Rehabilitation

## 2012-05-03 VITALS — BP 142/88 | HR 55 | Resp 14 | Ht 69.0 in | Wt 330.0 lb

## 2012-05-03 DIAGNOSIS — M47817 Spondylosis without myelopathy or radiculopathy, lumbosacral region: Secondary | ICD-10-CM | POA: Insufficient documentation

## 2012-05-03 DIAGNOSIS — M26609 Unspecified temporomandibular joint disorder, unspecified side: Secondary | ICD-10-CM | POA: Insufficient documentation

## 2012-05-03 DIAGNOSIS — M1711 Unilateral primary osteoarthritis, right knee: Secondary | ICD-10-CM

## 2012-05-03 DIAGNOSIS — M179 Osteoarthritis of knee, unspecified: Secondary | ICD-10-CM

## 2012-05-03 DIAGNOSIS — M1712 Unilateral primary osteoarthritis, left knee: Secondary | ICD-10-CM

## 2012-05-03 DIAGNOSIS — M797 Fibromyalgia: Secondary | ICD-10-CM

## 2012-05-03 DIAGNOSIS — Z79899 Other long term (current) drug therapy: Secondary | ICD-10-CM

## 2012-05-03 DIAGNOSIS — IMO0002 Reserved for concepts with insufficient information to code with codable children: Secondary | ICD-10-CM | POA: Insufficient documentation

## 2012-05-03 DIAGNOSIS — Z5181 Encounter for therapeutic drug level monitoring: Secondary | ICD-10-CM | POA: Insufficient documentation

## 2012-05-03 DIAGNOSIS — M1710 Unilateral primary osteoarthritis, unspecified knee: Secondary | ICD-10-CM

## 2012-05-03 DIAGNOSIS — M47816 Spondylosis without myelopathy or radiculopathy, lumbar region: Secondary | ICD-10-CM

## 2012-05-03 DIAGNOSIS — IMO0001 Reserved for inherently not codable concepts without codable children: Secondary | ICD-10-CM | POA: Insufficient documentation

## 2012-05-03 MED ORDER — HYDROMORPHONE HCL 4 MG PO TABS: 4.0000 mg | ORAL_TABLET | Freq: Every day | ORAL | Status: DC | PRN

## 2012-05-03 MED ORDER — PREGABALIN 300 MG PO CAPS: 300.0000 mg | ORAL_CAPSULE | Freq: Two times a day (BID) | ORAL | Status: DC

## 2012-05-03 MED ORDER — OXYCODONE HCL 5 MG PO TABA: 1.0000 | ORAL_TABLET | Freq: Three times a day (TID) | ORAL | Status: DC | PRN

## 2012-05-03 NOTE — Patient Instructions (Signed)
Try to do some exercises or stretches as tolerated

## 2012-05-03 NOTE — Progress Notes (Signed)
Subjective:    Patient ID: Shannon Meadows, female    DOB: 1970-11-25, 42 y.o.   MRN: [id number]  HPI The patient complains about headaches, low back pain and knee pain.She states, that her left knee is hurting more today. The patient denies any radiation.  The problem has been stable otherwise . She states things have been going fairly well.  She doesn't use her oxycodone every day. She usually uses them after work or when she has off. Her pain is usually worse at the end of her shift or the following morning.  Pain Inventory Average Pain 5 Pain Right Now 6 My pain is intermittent, constant, sharp, burning, dull and stabbing  In the last 24 hours, has pain interfered with the following? General activity 5 Relation with others 5 Enjoyment of life 5 What TIME of day is your pain at its worst? morning Sleep (in general) Poor  Pain is worse with: some activites Pain improves with: rest, heat/ice, medication and injections Relief from Meds: 5  Mobility walk without assistance ability to climb steps?  no do you drive?  yes Do you have any goals in this area?  yes  Function employed # of hrs/week 48 Do you have any goals in this area?  yes  Neuro/Psych spasms  Prior Studies Any changes since last visit?  no  Physicians involved in your care cardiologist   Family History  Problem Relation Age of Onset  . Heart disease    . Lung disease    . Diabetes    . Hypertension    . Hyperlipidemia Father   . Hypertension Father   . Arthritis Father   . Heart disease Father   . Asthma Mother   . Diabetes Mother   . Arthritis Mother   . Heart disease Mother    History   Social History  . Marital Status: Single    Spouse Name: N/A    Number of Children: N/A  . Years of Education: N/A   Social History Main Topics  . Smoking status: Never Smoker   . Smokeless tobacco: Never Used  . Alcohol Use: None  . Drug Use: None  . Sexually Active: None   Other Topics Concern  .  None   Social History Narrative  . None   Past Surgical History  Procedure Laterality Date  . Ulnar collateral ligament reconstruction    . Laporoscopic surgery      UTEREINE  . Knee arthroscopy     Past Medical History  Diagnosis Date  . Chronic headaches   . TMJ syndrome   . Back pain   . Lumbar sprain   . Migraines   . Myofascial pain   . Chronic pain syndrome   . Neck pain   . Endometriosis   . Cervicalgia   . Facet syndrome, lumbar   . Hypertension    BP 142/88  Pulse 55  Resp 14  Ht 5\' 9"  (1.753 m)  Wt 330 lb (149.687 kg)  BMI 48.71 kg/m2  SpO2 99%     Review of Systems  Gastrointestinal: Positive for nausea, diarrhea and constipation.  All other systems reviewed and are negative.       Objective:   Physical Exam Constitutional: She is oriented to person, place, and time. She appears well-developed and well-nourished.  Morbidly obese  HENT:  Head: Normocephalic.  Neck: Neck supple.  Neurological: She is alert and oriented to person, place, and time.  Skin: Skin is warm and  dry.  Psychiatric: She has a normal mood and affect.  Symmetric normal motor tone is noted throughout. Normal muscle bulk. Muscle testing reveals 5/5 muscle strength of the upper extremity, and 5/5 of the lower extremity. Full range of motion in upper and lower extremities. ROM of spine is restricted. Fine motor movements are normal in both hands.  Sensory is intact and symmetric to light touch, pinprick and proprioception.  DTR in the upper and lower extremity are present and symmetric 2+. No clonus is noted.  Patient arises from chair with mild difficulty. Wide based gait with normal arm swing bilateral , able to walk on heels and toes . Tandem walk is stable. No pronator drift. Rhomberg negative.        Assessment & Plan:  1. History of chronic migraines and cervicalgia with myofascial pain.  2. Temporomandibular joint dysfunction.  3. Lumbar was facet disease with history  of disk disease as well.  4. Osteoarthritis of the left knee, increased today, steroid injection given after patient signed consent. Patient tolerated the injection well, post injection instructions were given. 5. Plantar fibroids.  PLAN:  1. Weight loss,dietary changes and exercising were encouraged again, patient got a little unfriendly, anoyed by my efforts to help her in a healthy way, and not just provide her with her narcotic prescriptions. She was not very proactive in managing her health issues. She is not really willing at this point to develop some self initiative to treat her issues.  2. I refilled her Dilaudid 4 mg #12 and oxycodone 5 mg #90 today. She was given prescriptions for all these medications for an additional month. Would like ulimately for her oxycodone use to decrease also.  3. Follow up in 2 month.

## 2012-06-28 ENCOUNTER — Encounter: Payer: Self-pay | Admitting: Physical Medicine & Rehabilitation

## 2012-06-28 ENCOUNTER — Encounter: Payer: Medicare HMO | Attending: Physical Medicine and Rehabilitation | Admitting: Physical Medicine & Rehabilitation

## 2012-06-28 VITALS — BP 133/81 | HR 73 | Resp 14 | Ht 69.0 in | Wt 328.0 lb

## 2012-06-28 DIAGNOSIS — M797 Fibromyalgia: Secondary | ICD-10-CM

## 2012-06-28 DIAGNOSIS — G43109 Migraine with aura, not intractable, without status migrainosus: Secondary | ICD-10-CM

## 2012-06-28 DIAGNOSIS — M179 Osteoarthritis of knee, unspecified: Secondary | ICD-10-CM

## 2012-06-28 DIAGNOSIS — M1710 Unilateral primary osteoarthritis, unspecified knee: Secondary | ICD-10-CM

## 2012-06-28 DIAGNOSIS — IMO0002 Reserved for concepts with insufficient information to code with codable children: Secondary | ICD-10-CM

## 2012-06-28 DIAGNOSIS — M26609 Unspecified temporomandibular joint disorder, unspecified side: Secondary | ICD-10-CM | POA: Insufficient documentation

## 2012-06-28 DIAGNOSIS — M47816 Spondylosis without myelopathy or radiculopathy, lumbar region: Secondary | ICD-10-CM

## 2012-06-28 DIAGNOSIS — IMO0001 Reserved for inherently not codable concepts without codable children: Secondary | ICD-10-CM

## 2012-06-28 DIAGNOSIS — M47817 Spondylosis without myelopathy or radiculopathy, lumbosacral region: Secondary | ICD-10-CM | POA: Insufficient documentation

## 2012-06-28 MED ORDER — OXYCODONE HCL 5 MG PO TABA: 1.0000 | ORAL_TABLET | Freq: Three times a day (TID) | ORAL | Status: DC | PRN

## 2012-06-28 MED ORDER — HYDROMORPHONE HCL 4 MG PO TABS: 4.0000 mg | ORAL_TABLET | Freq: Every day | ORAL | Status: DC | PRN

## 2012-06-28 NOTE — Patient Instructions (Signed)
CALL ME WITH ANY PROBLEMS OR QUESTIONS (#[phone]).  HAVE A GOOD DAY  

## 2012-06-28 NOTE — Progress Notes (Signed)
Subjective:    Patient ID: Shannon Meadows, female    DOB: 05-23-1970, 42 y.o.   MRN: [id number]  HPI  Shrinika is back regarding her chronic pain. Her meds continue to work for the most part. Her neck tends to flare up still.  She stretches and takes breaks to help. TPI's really help her.   She's on two bp meds for htn. Bowels and bladder seem to be functioning well.  From a dental standpoint she is still struggling---she plans to get them all taken out.   Pain Inventory Average Pain 6 Pain Right Now 6 My pain is intermittent, constant, sharp, dull, stabbing, tingling and aching  In the last 24 hours, has pain interfered with the following? General activity 5 Relation with others 5 Enjoyment of life 5 What TIME of day is your pain at its worst? morning Sleep (in general) Poor  Pain is worse with: walking, bending, sitting, inactivity, standing and some activites Pain improves with: rest, heat/ice, pacing activities, medication and injections Relief from Meds: 6  Mobility walk without assistance ability to climb steps?  yes do you drive?  yes Do you have any goals in this area?  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  yes  Neuro/Psych numbness tingling spasms depression  Prior Studies Any changes since last visit?  no  Physicians involved in your care Any changes since last visit?  yes Primary care .   Family History  Problem Relation Age of Onset  . Heart disease    . Lung disease    . Diabetes    . Hypertension    . Hyperlipidemia Father   . Hypertension Father   . Arthritis Father   . Heart disease Father   . Asthma Mother   . Diabetes Mother   . Arthritis Mother   . Heart disease Mother    History   Social History  . Marital Status: Single    Spouse Name: N/A    Number of Children: N/A  . Years of Education: N/A   Social History Main Topics  . Smoking status: Never Smoker   . Smokeless tobacco: Never  Used  . Alcohol Use: None  . Drug Use: None  . Sexually Active: None   Other Topics Concern  . None   Social History Narrative  . None   Past Surgical History  Procedure Laterality Date  . Ulnar collateral ligament reconstruction    . Laporoscopic surgery      UTEREINE  . Knee arthroscopy     Past Medical History  Diagnosis Date  . Chronic headaches   . TMJ syndrome   . Back pain   . Lumbar sprain   . Migraines   . Myofascial pain   . Chronic pain syndrome   . Neck pain   . Endometriosis   . Cervicalgia   . Facet syndrome, lumbar   . Hypertension    BP 133/81  Pulse 73  Resp 14  Ht 5\' 9"  (1.753 m)  Wt 328 lb (148.78 kg)  BMI 48.42 kg/m2  SpO2 100%  LMP 06/09/2012     Review of Systems  Constitutional: Positive for unexpected weight change.  Respiratory: Positive for cough.   Gastrointestinal: Positive for nausea and constipation.  Neurological: Positive for numbness.  All other systems reviewed and are negative.       Objective:   Physical Exam  Constitutional: She is oriented to person,  place, and time. She appears well-developed and well-nourished.  Morbidly obese  HENT: dentition is poor. Head: Normocephalic and atraumatic.  Eyes: EOM are normal. Pupils are equal, round, and reactive to light.  Cardiovascular: Normal rate.  Pulmonary/Chest: Effort normal and breath sounds normal.  Abdominal: Soft.  Musculoskeletal:  Cervical back: She exhibits decreased range of motion, tenderness, bony tenderness and spasm.  Lumbar back: She exhibits decreased range of motion, tenderness, bony tenderness and pain. Particular spasms and Tp's are seen in the mid trap and mid spenius capitis in her typical areas. Left trap and right [place] more so today. Back:  Neurological: She is alert and oriented to person, place, and time. Gait is improved. Less antalgia seen.   Assessment & Plan:   ASSESSMENT:  1. History of chronic migraines with aura and cervicalgia with  myofascial pain.  2. Temporomandibular joint dysfunction.  3. 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  4. Osteoarthritis of the left knee greater than right with meniscal. Chondromalacia patellae  changes. Needs ortho follow  5. Plantar fibroids.   PLAN:  1. After informed consent , i injected right splenius capitis muscles and her right and left trap (medial and lateral) with 2cc of 1% lidocaine--for a total of 4 TPI's. Again stressed posture and stretching especially during her long shifts at work.  2. Continue with efforts at weight loss,dietary changes were encouraged once again.  3. I refilled her Dilaudid 4 mg #12 and oxycodone 5 mg #90 today. She was given prescriptions for all these medications for an additional month.  4.No other refills were needed. 5. Our PA will see her back here in about 2 months. I'll see her back in 4 months.

## 2012-08-23 ENCOUNTER — Encounter: Payer: Medicare HMO | Attending: Physical Medicine and Rehabilitation | Admitting: Physical Medicine and Rehabilitation

## 2012-08-23 ENCOUNTER — Encounter: Payer: Self-pay | Admitting: Physical Medicine and Rehabilitation

## 2012-08-23 VITALS — BP 138/82 | HR 66 | Resp 16 | Ht 69.0 in | Wt 330.8 lb

## 2012-08-23 DIAGNOSIS — M797 Fibromyalgia: Secondary | ICD-10-CM

## 2012-08-23 DIAGNOSIS — M545 Low back pain, unspecified: Secondary | ICD-10-CM | POA: Insufficient documentation

## 2012-08-23 DIAGNOSIS — G43909 Migraine, unspecified, not intractable, without status migrainosus: Secondary | ICD-10-CM | POA: Insufficient documentation

## 2012-08-23 DIAGNOSIS — M722 Plantar fascial fibromatosis: Secondary | ICD-10-CM | POA: Insufficient documentation

## 2012-08-23 DIAGNOSIS — M51379 Other intervertebral disc degeneration, lumbosacral region without mention of lumbar back pain or lower extremity pain: Secondary | ICD-10-CM | POA: Insufficient documentation

## 2012-08-23 DIAGNOSIS — IMO0002 Reserved for concepts with insufficient information to code with codable children: Secondary | ICD-10-CM

## 2012-08-23 DIAGNOSIS — M5137 Other intervertebral disc degeneration, lumbosacral region: Secondary | ICD-10-CM | POA: Insufficient documentation

## 2012-08-23 DIAGNOSIS — M47816 Spondylosis without myelopathy or radiculopathy, lumbar region: Secondary | ICD-10-CM

## 2012-08-23 DIAGNOSIS — M26609 Unspecified temporomandibular joint disorder, unspecified side: Secondary | ICD-10-CM | POA: Insufficient documentation

## 2012-08-23 DIAGNOSIS — IMO0001 Reserved for inherently not codable concepts without codable children: Secondary | ICD-10-CM

## 2012-08-23 DIAGNOSIS — M179 Osteoarthritis of knee, unspecified: Secondary | ICD-10-CM

## 2012-08-23 DIAGNOSIS — M47817 Spondylosis without myelopathy or radiculopathy, lumbosacral region: Secondary | ICD-10-CM

## 2012-08-23 DIAGNOSIS — M170 Bilateral primary osteoarthritis of knee: Secondary | ICD-10-CM

## 2012-08-23 DIAGNOSIS — M1710 Unilateral primary osteoarthritis, unspecified knee: Secondary | ICD-10-CM | POA: Insufficient documentation

## 2012-08-23 MED ORDER — OXYCODONE HCL 5 MG PO TABA: 1.0000 | ORAL_TABLET | Freq: Three times a day (TID) | ORAL | Status: DC | PRN

## 2012-08-23 MED ORDER — HYDROMORPHONE HCL 4 MG PO TABS: 4.0000 mg | ORAL_TABLET | Freq: Every day | ORAL | Status: DC | PRN

## 2012-08-23 NOTE — Patient Instructions (Addendum)
Please ask your cardiologist, whether you could take a migraine medication, like a triptan for example, which would not interfere with your HTN

## 2012-08-23 NOTE — Progress Notes (Signed)
Subjective:    Patient ID: Shannon Meadows, female    DOB: 10/11/1970, 42 y.o.   MRN: [id number]  HPI The patient complains about headaches, low back pain and knee pain.She states, that her left knee is hurting more today. The patient denies any radiation.  The problem has been stable otherwise . She states things have been going fairly well. She doesn't use her oxycodone every day. She usually uses them after work or when she has off. Her pain is usually worse at the end of her shift or the following morning. She is also asking for a migraine medication, like a triptan, but she also states that she has HTN.  Pain Inventory Average Pain 5 Pain Right Now 5 My pain is sharp, burning, dull, stabbing and aching  In the last 24 hours, has pain interfered with the following? General activity 4 Relation with others 4 Enjoyment of life 4 What TIME of day is your pain at its worst? daytime Sleep (in general) Poor  Pain is worse with: walking, bending, sitting and some activites Pain improves with: rest, heat/ice, pacing activities and medication Relief from Meds: 6  Mobility walk without assistance ability to climb steps?  yes do you drive?  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  no  Physicians involved in your care Any changes since last visit?  no   Family History  Problem Relation Age of Onset  . Heart disease    . Lung disease    . Diabetes    . Hypertension    . Hyperlipidemia Father   . Hypertension Father   . Arthritis Father   . Heart disease Father   . Asthma Mother   . Diabetes Mother   . Arthritis Mother   . Heart disease Mother    History   Social History  . Marital Status: Single    Spouse Name: N/A    Number of Children: N/A  . Years of Education: N/A   Social History Main Topics  . Smoking status: Never Smoker   . Smokeless tobacco: Never  Used  . Alcohol Use: None  . Drug Use: None  . Sexually Active: None   Other Topics Concern  . None   Social History Narrative  . None   Past Surgical History  Procedure Laterality Date  . Ulnar collateral ligament reconstruction    . Laporoscopic surgery      UTEREINE  . Knee arthroscopy     Past Medical History  Diagnosis Date  . Chronic headaches   . TMJ syndrome   . Back pain   . Lumbar sprain   . Migraines   . Myofascial pain   . Chronic pain syndrome   . Neck pain   . Endometriosis   . Cervicalgia   . Facet syndrome, lumbar   . Hypertension    BP 138/82  Pulse 66  Resp 16  Ht 5\' 9"  (1.753 m)  Wt 330 lb 12.8 oz (150.05 kg)  BMI 48.83 kg/m2  SpO2 98%    Review of Systems  Gastrointestinal: Positive for vomiting.  Musculoskeletal: Positive for myalgias.  Neurological: Positive for numbness.       Tingling  All other systems reviewed and are negative.       Objective:   Physical Exam Constitutional: She is oriented to person, place, and time. She appears well-developed and well-nourished.  Morbidly obese  HENT:  Head: Normocephalic.  Neck: Neck supple.  Neurological: She is alert and oriented to person, place, and time.  Skin: Skin is warm and dry.  Psychiatric: She has a normal mood and affect.  Symmetric normal motor tone is noted throughout. Normal muscle bulk. Muscle testing reveals 5/5 muscle strength of the upper extremity, and 5/5 of the lower extremity. Full range of motion in upper and lower extremities. ROM of spine is restricted. Fine motor movements are normal in both hands.  Sensory is intact and symmetric to light touch, pinprick and proprioception.  DTR in the upper and lower extremity are present and symmetric 2+. No clonus is noted.  Patient arises from chair with mild difficulty. Wide based gait with normal arm swing bilateral , able to walk on heels and toes . Tandem walk is stable. No pronator drift. Rhomberg negative. Left knee  mildly swollen       Assessment & Plan:  1. History of chronic migraines and cervicalgia with myofascial pain. Advised patient to ask her cardiologist, whether she could take a migraine medication from the Triptan family, and which one would increase her BP the least. 2. Temporomandibular joint dysfunction.  3. 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  4. Osteoarthritis of the left knee, increased today, steroid injection given after patient signed consent. Patient tolerated the injection well, post injection instructions were given.  5. Plantar fibroids.  PLAN:  1. Weight loss,dietary changes and exercising were encouraged again, to not just provide her with her narcotic prescriptions. She was not very proactive in managing her health issues. She is not really willing at this point to develop some self initiative to treat her issues.  2. I refilled her Dilaudid 4 mg #12 and oxycodone 5 mg #90 today. She was given prescriptions for all these medications for an additional month. Would like ulimately for her oxycodone use to decrease also.  3. Follow up in 2 month with Dr. Riley Kill.

## 2012-10-10 ENCOUNTER — Other Ambulatory Visit: Payer: Self-pay | Admitting: Physical Medicine and Rehabilitation

## 2012-10-18 ENCOUNTER — Encounter: Payer: Self-pay | Admitting: Physical Medicine & Rehabilitation

## 2012-10-18 ENCOUNTER — Encounter: Payer: Medicare HMO | Attending: Physical Medicine and Rehabilitation | Admitting: Physical Medicine & Rehabilitation

## 2012-10-18 VITALS — BP 137/74 | HR 66 | Resp 16 | Ht 69.0 in | Wt 338.2 lb

## 2012-10-18 DIAGNOSIS — M1710 Unilateral primary osteoarthritis, unspecified knee: Secondary | ICD-10-CM

## 2012-10-18 DIAGNOSIS — M47817 Spondylosis without myelopathy or radiculopathy, lumbosacral region: Secondary | ICD-10-CM

## 2012-10-18 DIAGNOSIS — G43109 Migraine with aura, not intractable, without status migrainosus: Secondary | ICD-10-CM

## 2012-10-18 DIAGNOSIS — M797 Fibromyalgia: Secondary | ICD-10-CM

## 2012-10-18 DIAGNOSIS — M26609 Unspecified temporomandibular joint disorder, unspecified side: Secondary | ICD-10-CM

## 2012-10-18 DIAGNOSIS — Z5181 Encounter for therapeutic drug level monitoring: Secondary | ICD-10-CM

## 2012-10-18 DIAGNOSIS — M179 Osteoarthritis of knee, unspecified: Secondary | ICD-10-CM

## 2012-10-18 DIAGNOSIS — IMO0002 Reserved for concepts with insufficient information to code with codable children: Secondary | ICD-10-CM

## 2012-10-18 DIAGNOSIS — M542 Cervicalgia: Secondary | ICD-10-CM

## 2012-10-18 DIAGNOSIS — IMO0001 Reserved for inherently not codable concepts without codable children: Secondary | ICD-10-CM | POA: Insufficient documentation

## 2012-10-18 DIAGNOSIS — Z79899 Other long term (current) drug therapy: Secondary | ICD-10-CM

## 2012-10-18 DIAGNOSIS — M47816 Spondylosis without myelopathy or radiculopathy, lumbar region: Secondary | ICD-10-CM

## 2012-10-18 MED ORDER — HYDROMORPHONE HCL 4 MG PO TABS: 4.0000 mg | ORAL_TABLET | Freq: Every day | ORAL | Status: DC | PRN

## 2012-10-18 MED ORDER — OXYCODONE HCL 5 MG PO TABA: 1.0000 | ORAL_TABLET | Freq: Three times a day (TID) | ORAL | Status: DC | PRN

## 2012-10-18 MED ORDER — SUMATRIPTAN SUCCINATE 6 MG/0.5ML ~~LOC~~ SOLN: 6.0000 mg | Freq: Every day | SUBCUTANEOUS | Status: DC | PRN

## 2012-10-18 NOTE — Progress Notes (Signed)
Subjective:    Patient ID: Shannon Meadows, female    DOB: [birth year], 42 y.o.   MRN: [id number]  HPI  Shannon Meadows is back regarding her chronic pain issues. 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 She is also going to school to get her bachelors.  She continues to have breakthrough migraines at least once a week, sometimes more. They aren't quite as severe as they once were. She usually is able to work through these, but since April has had to leave work 4 or 5 times. Headaches are in the frontal region, typically behind the eyes.   Her neck and shoulders are acting up again today, and she's requesting tpi's.   She had a knee injection last month, which helped, but her pain is beginning to increase again. She tells me she's trying to acrue PAL time so that she can have surgery done.    Pain Inventory Average Pain 5 Pain Right Now 6 My pain is constant, sharp, dull, stabbing, tingling and aching  In the last 24 hours, has pain interfered with the following? General activity 5 Relation with others 6 Enjoyment of life 5 What TIME of day is your pain at its worst? morning Sleep (in general) Fair  Pain is worse with: walking, bending, sitting, inactivity, standing and some activites Pain improves with: rest, heat/ice, therapy/exercise, pacing activities, medication, TENS and injections Relief from Meds: 6  Mobility walk without assistance ability to climb steps?  yes do you drive?  yes  Function employed # of hrs/week 48+wk  Neuro/Psych numbness  Prior Studies Any changes since last visit?  no  Physicians involved in your care Any changes since last visit?  no   Family History  Problem Relation Age of Onset  . Heart disease    . Lung disease    . Diabetes    . Hypertension    . Hyperlipidemia Father   . Hypertension Father   . Arthritis Father   . Heart disease Father   . Asthma Mother   . Diabetes Mother   . Arthritis Mother   . Heart disease Mother    History    Social History  . Marital Status: Single    Spouse Name: N/A    Number of Children: N/A  . Years of Education: N/A   Social History Main Topics  . Smoking status: Never Smoker   . Smokeless tobacco: Never Used  . Alcohol Use: None  . Drug Use: None  . Sexual Activity: None   Other Topics Concern  . None   Social History Narrative  . None   Past Surgical History  Procedure Laterality Date  . Ulnar collateral ligament reconstruction    . Laporoscopic surgery      UTEREINE  . Knee arthroscopy     Past Medical History  Diagnosis Date  . Chronic headaches   . TMJ syndrome   . Back pain   . Lumbar sprain   . Migraines   . Myofascial pain   . Chronic pain syndrome   . Neck pain   . Endometriosis   . Cervicalgia   . Facet syndrome, lumbar   . Hypertension    BP 137/74  Pulse 66  Resp 16  Ht 5\' 9"  (1.753 m)  Wt 338 lb 3.2 oz (153.407 kg)  BMI 49.92 kg/m2  SpO2 99%     Review of Systems  Constitutional: Positive for appetite change.  HENT: Positive for neck pain.   Respiratory:  Positive for cough.   Cardiovascular: Positive for leg swelling.  Gastrointestinal: Positive for nausea, vomiting, abdominal pain, diarrhea and constipation.  All other systems reviewed and are negative.       Objective:   Physical Exam  Constitutional: She is oriented to person, place, and time. She appears well-developed and well-nourished.  Morbidly obese  HENT: dentition is poor.  Head: Normocephalic and atraumatic.  Eyes: EOM are normal. Pupils are equal, round, and reactive to light.  Cardiovascular: Normal rate.  Pulmonary/Chest: Effort normal and breath sounds normal.  Abdominal: Soft.  Musculoskeletal: head forward position Cervical back: She exhibits decreased range of motion, tenderness, bony tenderness and spasm.  Lumbar back: She exhibits decreased range of motion, tenderness, bony tenderness and pain. Particular spasms and Tp's are seen in the mid traps and mid  spenius capitis muscles tdoay.  Back:  Neurological: She is alert and oriented to person, place, and time. Gait is improved. Less antalgia seen.   Assessment & Plan:   ASSESSMENT:  1. History of chronic migraines with aura and cervicalgia with myofascial pain.  2. Temporomandibular joint dysfunction.  3. 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  4. Osteoarthritis of the left knee greater than right with meniscal. Chondromalacia patellae  changes. Needs ortho follow  5. Plantar fibroids.   PLAN:  1. After informed consent , i injected bilateral splenius capitis muscles and her right and left trap each with 2cc of 1% lidocaine--for a total of 4 TPI's. Again stressed posture and stretching especially during her long shifts at work.   2. Continue with efforts at weight loss,dietary changes were encouraged once again. She also needs to see surgery regarding her knee. 3. I refilled her Dilaudid 4 mg #12 and oxycodone 5 mg #90 today. She was given prescriptions for all these medications for an additional month.  4. Wrote rx for [facility] Imitrex for breakthrough migraines. Asked her to keep a headache diary. Could consider botox for migraines depending on her pattern/course.  5. Our PA will see her back here in about 2 months. I'll see her back in 4 months.

## 2012-10-18 NOTE — Patient Instructions (Signed)
CALL ME WITH ANY PROBLEMS OR QUESTIONS (#[phone]).  HAVE A GOOD DAY  

## 2012-10-23 ENCOUNTER — Telehealth: Payer: Self-pay | Admitting: *Deleted

## 2012-10-23 NOTE — Telephone Encounter (Signed)
OK 

## 2012-10-23 NOTE — Telephone Encounter (Signed)
[facility] in [place] called and Imitrex has an interaction warning with HTN and will need your ok to override this

## 2012-10-24 NOTE — Telephone Encounter (Signed)
Called [facility] and gave verbal ok to fill Imitrex per Dr Riley Kill.

## 2012-10-25 ENCOUNTER — Ambulatory Visit: Payer: Medicare HMO | Admitting: Physical Medicine & Rehabilitation

## 2012-11-19 ENCOUNTER — Other Ambulatory Visit: Payer: Self-pay | Admitting: Physical Medicine & Rehabilitation

## 2012-11-20 ENCOUNTER — Other Ambulatory Visit: Payer: Self-pay

## 2012-11-20 MED ORDER — PREGABALIN 300 MG PO CAPS: ORAL_CAPSULE | ORAL | Status: DC

## 2012-12-13 ENCOUNTER — Encounter: Payer: Medicare HMO | Attending: Physical Medicine and Rehabilitation | Admitting: Physical Medicine and Rehabilitation

## 2012-12-13 ENCOUNTER — Encounter: Payer: Self-pay | Admitting: Physical Medicine and Rehabilitation

## 2012-12-13 VITALS — BP 149/88 | HR 71 | Resp 16 | Ht 69.0 in | Wt 327.0 lb

## 2012-12-13 DIAGNOSIS — IMO0002 Reserved for concepts with insufficient information to code with codable children: Secondary | ICD-10-CM

## 2012-12-13 DIAGNOSIS — IMO0001 Reserved for inherently not codable concepts without codable children: Secondary | ICD-10-CM

## 2012-12-13 DIAGNOSIS — M179 Osteoarthritis of knee, unspecified: Secondary | ICD-10-CM

## 2012-12-13 DIAGNOSIS — M797 Fibromyalgia: Secondary | ICD-10-CM

## 2012-12-13 DIAGNOSIS — M542 Cervicalgia: Secondary | ICD-10-CM | POA: Insufficient documentation

## 2012-12-13 DIAGNOSIS — M1710 Unilateral primary osteoarthritis, unspecified knee: Secondary | ICD-10-CM | POA: Insufficient documentation

## 2012-12-13 DIAGNOSIS — M26609 Unspecified temporomandibular joint disorder, unspecified side: Secondary | ICD-10-CM | POA: Insufficient documentation

## 2012-12-13 DIAGNOSIS — D2120 Benign neoplasm of connective and other soft tissue of unspecified lower limb, including hip: Secondary | ICD-10-CM | POA: Insufficient documentation

## 2012-12-13 DIAGNOSIS — M47816 Spondylosis without myelopathy or radiculopathy, lumbar region: Secondary | ICD-10-CM

## 2012-12-13 DIAGNOSIS — Z79899 Other long term (current) drug therapy: Secondary | ICD-10-CM | POA: Insufficient documentation

## 2012-12-13 DIAGNOSIS — M47817 Spondylosis without myelopathy or radiculopathy, lumbosacral region: Secondary | ICD-10-CM

## 2012-12-13 DIAGNOSIS — G43909 Migraine, unspecified, not intractable, without status migrainosus: Secondary | ICD-10-CM | POA: Insufficient documentation

## 2012-12-13 MED ORDER — OXYCODONE HCL 5 MG PO TABA: 1.0000 | ORAL_TABLET | Freq: Three times a day (TID) | ORAL | Status: DC | PRN

## 2012-12-13 MED ORDER — HYDROMORPHONE HCL 4 MG PO TABS: 4.0000 mg | ORAL_TABLET | Freq: Every day | ORAL | Status: DC | PRN

## 2012-12-13 NOTE — Progress Notes (Signed)
Subjective:    Patient ID: Shannon Meadows, female    DOB: [birth year], 42 y.o.   MRN: [id number]  HPI The patient complains about headaches, low back pain and knee pain.She states, that her left knee is hurting more today. The patient denies any radiation.  The problem has been stable otherwise . She states things have been going fairly well. She doesn't use her oxycodone every day. She usually uses them after work or when she has off. Her pain is usually worse at the end of her shift or the following morning.    Pain Inventory Average Pain 5 Pain Right Now 7 My pain is intermittent, constant, sharp, burning, dull, stabbing, tingling and aching  In the last 24 hours, has pain interfered with the following? General activity 6 Relation with others 4 Enjoyment of life 3 What TIME of day is your pain at its worst? depends Sleep (in general) Poor  Pain is worse with: walking, bending, sitting, inactivity, standing and some activites Pain improves with: rest, heat/ice, medication, TENS and injections Relief from Meds: 7  Mobility walk without assistance ability to climb steps?  yes do you drive?  yes Do you have any goals in this area?  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  yes  Neuro/Psych trouble walking dizziness  Prior Studies Any changes since last visit?  no  Physicians involved in your care Any changes since last visit?  no   Family History  Problem Relation Age of Onset  . Heart disease    . Lung disease    . Diabetes    . Hypertension    . Hyperlipidemia Father   . Hypertension Father   . Arthritis Father   . Heart disease Father   . Asthma Mother   . Diabetes Mother   . Arthritis Mother   . Heart disease Mother    History   Social History  . Marital Status: Single    Spouse Name: N/A    Number of Children: N/A  . Years of Education: N/A   Social History Main Topics  . Smoking status: Never Smoker   .  Smokeless tobacco: Never Used  . Alcohol Use: None  . Drug Use: None  . Sexual Activity: None   Other Topics Concern  . None   Social History Narrative  . None   Past Surgical History  Procedure Laterality Date  . Ulnar collateral ligament reconstruction    . Laporoscopic surgery      UTEREINE  . Knee arthroscopy     Past Medical History  Diagnosis Date  . Chronic headaches   . TMJ syndrome   . Back pain   . Lumbar sprain   . Migraines   . Myofascial pain   . Chronic pain syndrome   . Neck pain   . Endometriosis   . Cervicalgia   . Facet syndrome, lumbar   . Hypertension    BP 149/88  Pulse 71  Resp 16  Ht 5\' 9"  (1.753 m)  Wt 327 lb (148.326 kg)  BMI 48.27 kg/m2  SpO2 100%     Review of Systems  Constitutional: Positive for unexpected weight change.  Gastrointestinal: Positive for nausea and vomiting.  Musculoskeletal: Positive for gait problem.  Neurological: Positive for dizziness.  All other systems reviewed and are negative.       Objective:   Physical Exam Constitutional: She is oriented to person, place, and  time. She appears well-developed and well-nourished.  Morbidly obese  HENT:  Head: Normocephalic.  Neck: Neck supple.  Neurological: She is alert and oriented to person, place, and time.  Skin: Skin is warm and dry.  Psychiatric: She has a normal mood and affect.  Symmetric normal motor tone is noted throughout. Normal muscle bulk. Muscle testing reveals 5/5 muscle strength of the upper extremity, and 5/5 of the lower extremity. Full range of motion in upper and lower extremities. ROM of spine is restricted. Fine motor movements are normal in both hands.  Sensory is intact and symmetric to light touch, pinprick and proprioception.  DTR in the upper and lower extremity are present and symmetric 2+. No clonus is noted.  Patient arises from chair with mild difficulty. Wide based gait with normal arm swing bilateral , able to walk on heels and  toes . Tandem walk is stable. No pronator drift. Rhomberg negative.  Left knee mildly swollen        Assessment & Plan:  1. History of chronic migraines and cervicalgia with myofascial pain. Advised patient to ask her cardiologist, whether she could take a migraine medication from the Triptan family, and which one would increase her BP the least.  2. Temporomandibular joint dysfunction.  3. 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  4. Osteoarthritis of the left knee, increased today, steroid injection given after patient signed consent. Patient tolerated the injection well, post injection instructions were given.  5. Plantar fibroids.  PLAN:  1. Weight loss,dietary changes and exercising were encouraged again, to not just provide her with her narcotic prescriptions. She was not very proactive in managing her health issues. She is not really willing at this point to develop some self initiative to treat her issues.  2. I refilled her Dilaudid 4 mg #12 and oxycodone 5 mg #90 today. She was given prescriptions for all these medications for an additional month. Would like ulimately for her oxycodone use to decrease also.  3. Follow up in 2 month with Dr. Riley Kill.

## 2012-12-13 NOTE — Patient Instructions (Signed)
Try to stay as active as tolerated 

## 2013-02-14 ENCOUNTER — Encounter: Payer: Medicare HMO | Attending: Physical Medicine and Rehabilitation | Admitting: Physical Medicine & Rehabilitation

## 2013-02-14 ENCOUNTER — Encounter: Payer: Self-pay | Admitting: Physical Medicine & Rehabilitation

## 2013-02-14 VITALS — BP 133/84 | HR 80 | Resp 14 | Ht 69.0 in | Wt 327.0 lb

## 2013-02-14 DIAGNOSIS — M179 Osteoarthritis of knee, unspecified: Secondary | ICD-10-CM

## 2013-02-14 DIAGNOSIS — M1710 Unilateral primary osteoarthritis, unspecified knee: Secondary | ICD-10-CM

## 2013-02-14 DIAGNOSIS — IMO0001 Reserved for inherently not codable concepts without codable children: Secondary | ICD-10-CM | POA: Insufficient documentation

## 2013-02-14 DIAGNOSIS — M47816 Spondylosis without myelopathy or radiculopathy, lumbar region: Secondary | ICD-10-CM

## 2013-02-14 DIAGNOSIS — M47817 Spondylosis without myelopathy or radiculopathy, lumbosacral region: Secondary | ICD-10-CM | POA: Insufficient documentation

## 2013-02-14 DIAGNOSIS — M797 Fibromyalgia: Secondary | ICD-10-CM

## 2013-02-14 DIAGNOSIS — IMO0002 Reserved for concepts with insufficient information to code with codable children: Secondary | ICD-10-CM | POA: Insufficient documentation

## 2013-02-14 DIAGNOSIS — G43109 Migraine with aura, not intractable, without status migrainosus: Secondary | ICD-10-CM

## 2013-02-14 DIAGNOSIS — M26609 Unspecified temporomandibular joint disorder, unspecified side: Secondary | ICD-10-CM

## 2013-02-14 MED ORDER — HYDROMORPHONE HCL 4 MG PO TABS: 4.0000 mg | ORAL_TABLET | Freq: Every day | ORAL | Status: DC | PRN

## 2013-02-14 MED ORDER — OXYCODONE HCL 5 MG PO TABA: 1.0000 | ORAL_TABLET | Freq: Three times a day (TID) | ORAL | Status: DC | PRN

## 2013-02-14 NOTE — Patient Instructions (Signed)
PLEASE CALL ME WITH ANY PROBLEMS OR QUESTIONS (#[phone]).      

## 2013-02-14 NOTE — Progress Notes (Signed)
Subjective:    Patient ID: Shannon Meadows, female    DOB: Jun 06, 1970, 43 y.o.   MRN: [id number]  HPI  Shannon Meadows is back regarding her chronic pain syndrome. She has been fairly steady regarding her pain. She is having a little more pain than usual today as she slept on an uncomfortable hotel bed last night.   She is working full time as an Therapist, sports and going to school as well.   She hurts in the typical areas including her head, neck and left knee. Her low back bothers her more because of the bed.   She typically responds well to TPI's. The last injections we did in September. She had a knee injection the last time she saw my PA in November.      Pain Inventory Average Pain 5 Pain Right Now 9 My pain is intermittent, constant, sharp, burning, dull, stabbing and aching  In the last 24 hours, has pain interfered with the following? General activity 5 Relation with others 4 Enjoyment of life 4 What TIME of day is your pain at its worst? varies Sleep (in general) Poor  Pain is worse with: walking, bending, sitting, inactivity, standing, unsure, some activites and other Pain improves with: rest, heat/ice, therapy/exercise, pacing activities, medication, TENS and injections Relief from Meds: 6  Mobility walk without assistance ability to climb steps?  yes do you drive?  yes transfers alone Do you have any goals in this area?  yes  Function employed # of hrs/week 36-48+ Do you have any goals in this area?  yes  Neuro/Psych No problems in this area  Prior Studies Any changes since last visit?  no  Physicians involved in your care Any changes since last visit?  no   Family History  Problem Relation Age of Onset  . Heart disease    . Lung disease    . Diabetes    . Hypertension    . Hyperlipidemia Father   . Hypertension Father   . Arthritis Father   . Heart disease Father   . Asthma Mother   . Diabetes Mother   . Arthritis Mother   . Heart disease Mother    History     Social History  . Marital Status: Single    Spouse Name: N/A    Number of Children: N/A  . Years of Education: N/A   Social History Main Topics  . Smoking status: Never Smoker   . Smokeless tobacco: Never Used  . Alcohol Use: None  . Drug Use: None  . Sexual Activity: None   Other Topics Concern  . None   Social History Narrative  . None   Past Surgical History  Procedure Laterality Date  . Ulnar collateral ligament reconstruction    . Laporoscopic surgery      UTEREINE  . Knee arthroscopy     Past Medical History  Diagnosis Date  . Chronic headaches   . TMJ syndrome   . Back pain   . Lumbar sprain   . Migraines   . Myofascial pain   . Chronic pain syndrome   . Neck pain   . Endometriosis   . Cervicalgia   . Facet syndrome, lumbar   . Hypertension    BP 133/84  Pulse 80  Resp 14  Ht 5\' 9"  (1.753 m)  Wt 327 lb (148.326 kg)  BMI 48.27 kg/m2  SpO2 96%     Review of Systems  Respiratory: Positive for cough.  Respiratory infections  Cardiovascular: Positive for leg swelling.  Gastrointestinal: Positive for nausea and vomiting.  Musculoskeletal: Positive for back pain.  All other systems reviewed and are negative.       Objective:   Physical Exam  Constitutional: She is oriented to person, place, and time. She appears well-developed and well-nourished.  Morbidly obese  HENT: dentition is poor.  Head: Normocephalic and atraumatic.  Eyes: EOM are normal. Pupils are equal, round, and reactive to light.  Cardiovascular: Normal rate.  Pulmonary/Chest: Effort normal and breath sounds normal.  Abdominal: Soft.  Musculoskeletal: head forward position Cervical back: She exhibits decreased range of motion, tenderness, bony tenderness and spasm, particularly at the upper [place]'s. Lumbar back: She exhibits decreased range of motion, tenderness, bony tenderness and pain. continued Tp's are seen in the mid traps and mid spenius capitis muscles tdoay.   Back:low back generally tender.decreased flexion/extension today.  Neurological: She is alert and oriented to person, place, and time. Gait is improved. Less antalgia seen.  Assessment & Plan:   ASSESSMENT:   1. History of chronic migraines with aura and cervicalgia with myofascial pain.  2. Temporomandibular joint dysfunction.  3. 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  4. Osteoarthritis of the left knee greater than right with meniscal. Chondromalacia patellae  changes. Needs ortho follow  5. Plantar fibroids.   PLAN:  1. After informed consent , i injected bilateral splenius capitis muscles and her right upper and left mid trap each with 2cc of 1% lidocaine--for a total of 4 TPI's. Again stressed posture and stretching especially during her long shifts at work.  2. Ongoing dietary and exercises changes per our past discussions.  3. I refilled her Dilaudid 4 mg #12 and oxycodone 5 mg #90 today. She was given prescriptions for all these medications for an additional month.  4.  Did not need refills on other medications.  5. I will see her back here in about 2 months.15 minutes of face to face patient care time were spent during this visit. All questions were encouraged and answered.

## 2013-04-08 ENCOUNTER — Other Ambulatory Visit: Payer: Self-pay | Admitting: *Deleted

## 2013-04-08 DIAGNOSIS — M179 Osteoarthritis of knee, unspecified: Secondary | ICD-10-CM

## 2013-04-08 DIAGNOSIS — M1710 Unilateral primary osteoarthritis, unspecified knee: Secondary | ICD-10-CM

## 2013-04-08 DIAGNOSIS — M797 Fibromyalgia: Secondary | ICD-10-CM

## 2013-04-08 DIAGNOSIS — M26609 Unspecified temporomandibular joint disorder, unspecified side: Secondary | ICD-10-CM

## 2013-04-08 DIAGNOSIS — M47816 Spondylosis without myelopathy or radiculopathy, lumbar region: Secondary | ICD-10-CM

## 2013-04-08 MED ORDER — HYDROMORPHONE HCL 4 MG PO TABS: 4.0000 mg | ORAL_TABLET | Freq: Every day | ORAL | Status: DC | PRN

## 2013-04-08 MED ORDER — OXYCODONE HCL 5 MG PO TABA: 1.0000 | ORAL_TABLET | Freq: Three times a day (TID) | ORAL | Status: DC | PRN

## 2013-04-10 ENCOUNTER — Other Ambulatory Visit: Payer: Self-pay | Admitting: *Deleted

## 2013-04-10 DIAGNOSIS — M797 Fibromyalgia: Secondary | ICD-10-CM

## 2013-04-10 DIAGNOSIS — M26609 Unspecified temporomandibular joint disorder, unspecified side: Secondary | ICD-10-CM

## 2013-04-10 DIAGNOSIS — M47816 Spondylosis without myelopathy or radiculopathy, lumbar region: Secondary | ICD-10-CM

## 2013-04-10 DIAGNOSIS — M179 Osteoarthritis of knee, unspecified: Secondary | ICD-10-CM

## 2013-04-10 DIAGNOSIS — M1710 Unilateral primary osteoarthritis, unspecified knee: Secondary | ICD-10-CM

## 2013-04-10 MED ORDER — HYDROMORPHONE HCL 4 MG PO TABS: 4.0000 mg | ORAL_TABLET | Freq: Every day | ORAL | Status: DC | PRN

## 2013-04-10 MED ORDER — OXYCODONE HCL 5 MG PO TABA
1.0000 | ORAL_TABLET | Freq: Three times a day (TID) | ORAL | Status: DC | PRN
Start: 2013-04-10 — End: 2013-06-13

## 2013-04-10 NOTE — Telephone Encounter (Signed)
Additional month RX printed for MD to sign for RN visit 04/11/13 (comes every 2 months)

## 2013-04-11 ENCOUNTER — Encounter: Payer: Medicare HMO | Attending: Physical Medicine & Rehabilitation | Admitting: *Deleted

## 2013-04-11 ENCOUNTER — Encounter: Payer: Medicare HMO | Admitting: Physical Medicine & Rehabilitation

## 2013-04-11 ENCOUNTER — Encounter: Payer: Self-pay | Admitting: *Deleted

## 2013-04-11 VITALS — BP 159/78 | HR 67 | Resp 14 | Wt 336.8 lb

## 2013-04-11 DIAGNOSIS — M1710 Unilateral primary osteoarthritis, unspecified knee: Secondary | ICD-10-CM | POA: Insufficient documentation

## 2013-04-11 DIAGNOSIS — M542 Cervicalgia: Secondary | ICD-10-CM | POA: Insufficient documentation

## 2013-04-11 DIAGNOSIS — M797 Fibromyalgia: Secondary | ICD-10-CM

## 2013-04-11 DIAGNOSIS — M519 Unspecified thoracic, thoracolumbar and lumbosacral intervertebral disc disorder: Secondary | ICD-10-CM | POA: Insufficient documentation

## 2013-04-11 DIAGNOSIS — I10 Essential (primary) hypertension: Secondary | ICD-10-CM | POA: Insufficient documentation

## 2013-04-11 DIAGNOSIS — G894 Chronic pain syndrome: Secondary | ICD-10-CM | POA: Insufficient documentation

## 2013-04-11 DIAGNOSIS — M26609 Unspecified temporomandibular joint disorder, unspecified side: Secondary | ICD-10-CM

## 2013-04-11 DIAGNOSIS — G43109 Migraine with aura, not intractable, without status migrainosus: Secondary | ICD-10-CM

## 2013-04-11 DIAGNOSIS — M47816 Spondylosis without myelopathy or radiculopathy, lumbar region: Secondary | ICD-10-CM

## 2013-04-11 DIAGNOSIS — M179 Osteoarthritis of knee, unspecified: Secondary | ICD-10-CM

## 2013-04-11 NOTE — Progress Notes (Signed)
Here for pill count and medication refills. Dilaudid 4 mg #12 Fill date 03/15/13 Today NV# 0 Oxycodone 5mg  # 77 Fill date 03/15/13   Today NV# 0  VSS    She has not had any falls.  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  She is a low fall risk.  She has driven down from [place] and will be driving immediately back with very little sleep.  I have cautioned her to please be careful and pull over if she gets tired. (she does this each visit). Return in 2 month to see Dr Naaman Plummer.  2 month refills were given.

## 2013-04-11 NOTE — Patient Instructions (Addendum)
Follow up 2 month with Dr Naaman Plummer

## 2013-06-13 ENCOUNTER — Encounter: Payer: Medicare HMO | Attending: Physical Medicine & Rehabilitation | Admitting: Physical Medicine & Rehabilitation

## 2013-06-13 ENCOUNTER — Encounter: Payer: Self-pay | Admitting: Physical Medicine & Rehabilitation

## 2013-06-13 VITALS — BP 147/107 | HR 75 | Resp 14 | Wt 337.4 lb

## 2013-06-13 DIAGNOSIS — M47817 Spondylosis without myelopathy or radiculopathy, lumbosacral region: Secondary | ICD-10-CM | POA: Insufficient documentation

## 2013-06-13 DIAGNOSIS — M47816 Spondylosis without myelopathy or radiculopathy, lumbar region: Secondary | ICD-10-CM

## 2013-06-13 DIAGNOSIS — M797 Fibromyalgia: Secondary | ICD-10-CM

## 2013-06-13 DIAGNOSIS — IMO0001 Reserved for inherently not codable concepts without codable children: Secondary | ICD-10-CM

## 2013-06-13 DIAGNOSIS — G43109 Migraine with aura, not intractable, without status migrainosus: Secondary | ICD-10-CM

## 2013-06-13 DIAGNOSIS — IMO0002 Reserved for concepts with insufficient information to code with codable children: Secondary | ICD-10-CM

## 2013-06-13 DIAGNOSIS — M1710 Unilateral primary osteoarthritis, unspecified knee: Secondary | ICD-10-CM

## 2013-06-13 DIAGNOSIS — M26609 Unspecified temporomandibular joint disorder, unspecified side: Secondary | ICD-10-CM | POA: Insufficient documentation

## 2013-06-13 DIAGNOSIS — M179 Osteoarthritis of knee, unspecified: Secondary | ICD-10-CM

## 2013-06-13 MED ORDER — HYDROMORPHONE HCL 4 MG PO TABS: 4.0000 mg | ORAL_TABLET | Freq: Every day | ORAL | Status: DC | PRN

## 2013-06-13 MED ORDER — OXYCODONE HCL 5 MG PO TABA: 1.0000 | ORAL_TABLET | Freq: Three times a day (TID) | ORAL | Status: DC | PRN

## 2013-06-13 MED ORDER — PREGABALIN 300 MG PO CAPS: ORAL_CAPSULE | ORAL | Status: DC

## 2013-06-13 NOTE — Progress Notes (Signed)
Subjective:    Patient ID: Shannon Meadows, female    DOB: Jun 02, 1970, 43 y.o.   MRN: [id number]  HPI  Shannon Meadows is back regarding her chronic pain syndrome. She has been able to get by over the last few months although her left knee has been increasingly tender. She has not yet found an orthopedic surgeon to assess her knee. She is working full time as an Therapist, sports in [place].   Her headaches have been an issue still as well but generally stable. Her jaw pain remains tolerable.   She is using oxyodone for moderate pain and the dilaudid for sever pain.   She has had a problem with her allergies but otherwise has been healthy.   Pain Inventory Average Pain 6 Pain Right Now 6 My pain is constant, sharp, burning, dull, stabbing, tingling and aching  In the last 24 hours, has pain interfered with the following? General activity 5 Relation with others 5 Enjoyment of life 4 What TIME of day is your pain at its worst? varies Sleep (in general) Fair  Pain is worse with: walking, bending, sitting, inactivity, standing and some activites Pain improves with: rest, medication, TENS and injections Relief from Meds: 6  Mobility walk without assistance ability to climb steps?  yes do you drive?  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  no  Physicians involved in your care Any changes since last visit?  no   Family History  Problem Relation Age of Onset  . Heart disease    . Lung disease    . Diabetes    . Hypertension    . Hyperlipidemia Father   . Hypertension Father   . Arthritis Father   . Heart disease Father   . Asthma Mother   . Diabetes Mother   . Arthritis Mother   . Heart disease Mother    History   Social History  . Marital Status: Single    Spouse Name: N/A    Number of Children: N/A  . Years of Education: N/A   Social History Main Topics  . Smoking status: Never Smoker   .  Smokeless tobacco: Never Used  . Alcohol Use: None  . Drug Use: None  . Sexual Activity: None   Other Topics Concern  . None   Social History Narrative  . None   Past Surgical History  Procedure Laterality Date  . Ulnar collateral ligament reconstruction    . Laporoscopic surgery      UTEREINE  . Knee arthroscopy     Past Medical History  Diagnosis Date  . Chronic headaches   . TMJ syndrome   . Back pain   . Lumbar sprain   . Migraines   . Myofascial pain   . Chronic pain syndrome   . Neck pain   . Endometriosis   . Cervicalgia   . Facet syndrome, lumbar   . Hypertension    BP 147/107  Pulse 75  Resp 14  Wt 337 lb 6.4 oz (153.044 kg)  SpO2 100%  Opioid Risk Score:   Fall Risk Score: Low Fall Risk (0-5 points) (educational fall prevention handout refused (she is an Therapist, sports))  Review of Systems  Constitutional: Positive for appetite change.  Respiratory: Positive for cough.   Cardiovascular: Positive for leg swelling.  Gastrointestinal: Positive for nausea, abdominal pain, diarrhea and constipation.  Musculoskeletal:  Spasms  All other systems reviewed and are negative.      Objective:   Physical Exam  Constitutional: She is oriented to person, place, and time. She appears well-developed and well-nourished.  Morbidly obese  HENT: dentition is poor.  Head: Normocephalic and atraumatic.  Eyes: EOM are normal. Pupils are equal, round, and reactive to light.  Cardiovascular: Normal rate.  Pulmonary/Chest: Effort normal and breath sounds normal.  Abdominal: Soft.  Musculoskeletal: head forward position Cervical back: She exhibits decreased range of motion, tenderness, bony tenderness and spasm, particularly at the upper [place]'s. Lumbar back: She exhibits decreased range of motion, tenderness, bony tenderness and pain. continued Tp's are seen in the mid traps and mid spenius capitis muscles tdoay.  Back:low back generally tender.decreased flexion/extension  today.  Neurological: She is alert and oriented to person, place, and time. Gait is improved. Less antalgia seen.    Assessment & Plan:   ASSESSMENT:  1. History of chronic migraines with aura and cervicalgia with myofascial pain.  2. Temporomandibular joint dysfunction.  3. 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  4. Osteoarthritis of the left knee greater than right with meniscal. Chondromalacia patellae  changes. Needs ortho follow  5. Plantar fibroids.    PLAN:  1. After informed consent , i injected bilateral mid/anterior traps each with 2cc of 1% lidocaine--for a total of 2 TPI's. Again stressed posture and stretching especially during her long shifts at work.  2. After informed consent and preparation of the skin with betadine and isopropyl alcohol, I injected 6mg  (1cc) of celestone and 4cc of 1% lidocaine into the left knee via antero-medial approach. Additionally, aspiration was performed prior to injection. The patient tolerated well, and no complications were encountered. Afterward the area was cleaned and dressed. Post- injection instructions were provided. Advised orthopedic follow up in [place] 3. I refilled her Dilaudid 4 mg #12 and oxycodone 5 mg #90 today. She was given prescriptions for all these medications for an additional month.  4. Refilled lyrica toay.  5. I will see her back here in about 2 months.15 minutes of face to face patient care time were spent during this visit. All questions were encouraged and answered.

## 2013-06-13 NOTE — Patient Instructions (Signed)
PLEASE CALL ME WITH ANY PROBLEMS OR QUESTIONS (#[phone]).      

## 2013-06-15 ENCOUNTER — Other Ambulatory Visit: Payer: Self-pay | Admitting: Physical Medicine & Rehabilitation

## 2013-08-08 ENCOUNTER — Encounter: Payer: Medicare HMO | Attending: Physical Medicine & Rehabilitation | Admitting: Registered Nurse

## 2013-08-08 ENCOUNTER — Ambulatory Visit: Payer: Medicare HMO | Admitting: Physical Medicine & Rehabilitation

## 2013-08-08 ENCOUNTER — Encounter: Payer: Self-pay | Admitting: Registered Nurse

## 2013-08-08 VITALS — BP 141/87 | HR 70 | Resp 16 | Ht 69.0 in | Wt 340.0 lb

## 2013-08-08 DIAGNOSIS — IMO0001 Reserved for inherently not codable concepts without codable children: Secondary | ICD-10-CM | POA: Diagnosis present

## 2013-08-08 DIAGNOSIS — M1710 Unilateral primary osteoarthritis, unspecified knee: Secondary | ICD-10-CM

## 2013-08-08 DIAGNOSIS — IMO0002 Reserved for concepts with insufficient information to code with codable children: Secondary | ICD-10-CM

## 2013-08-08 DIAGNOSIS — M797 Fibromyalgia: Secondary | ICD-10-CM

## 2013-08-08 DIAGNOSIS — M47817 Spondylosis without myelopathy or radiculopathy, lumbosacral region: Secondary | ICD-10-CM

## 2013-08-08 DIAGNOSIS — Z79899 Other long term (current) drug therapy: Secondary | ICD-10-CM

## 2013-08-08 DIAGNOSIS — M26609 Unspecified temporomandibular joint disorder, unspecified side: Secondary | ICD-10-CM

## 2013-08-08 DIAGNOSIS — M170 Bilateral primary osteoarthritis of knee: Secondary | ICD-10-CM

## 2013-08-08 DIAGNOSIS — M47816 Spondylosis without myelopathy or radiculopathy, lumbar region: Secondary | ICD-10-CM

## 2013-08-08 DIAGNOSIS — Z5181 Encounter for therapeutic drug level monitoring: Secondary | ICD-10-CM

## 2013-08-08 DIAGNOSIS — G43109 Migraine with aura, not intractable, without status migrainosus: Secondary | ICD-10-CM

## 2013-08-08 MED ORDER — OXYCODONE HCL 5 MG PO TABA: 1.0000 | ORAL_TABLET | Freq: Three times a day (TID) | ORAL | Status: DC | PRN

## 2013-08-08 MED ORDER — HYDROMORPHONE HCL 4 MG PO TABS: 4.0000 mg | ORAL_TABLET | Freq: Every day | ORAL | Status: DC | PRN

## 2013-08-08 NOTE — Progress Notes (Signed)
Subjective:    Patient ID: Shannon Meadows, female    DOB: 1970-03-30, 43 y.o.   MRN: [id number]  HPI: Shannon Meadows is a 43 year old female who returns for follow up for chronic pain and medication refill. She says her pain is located in her lower back and left knee. She rates her pain 4. Her current exercise regime is walking. 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  Pain Inventory Average Pain 5 Pain Right Now 4 My pain is intermittent, constant, sharp, burning, dull, stabbing, tingling and aching  In the last 24 hours, has pain interfered with the following? General activity 6 Relation with others 5 Enjoyment of life 5 What TIME of day is your pain at its worst? depends Sleep (in general) Fair  Pain is worse with: walking, bending, sitting, inactivity, standing and some activites Pain improves with: rest, heat/ice, pacing activities, medication, TENS and injections Relief from Meds: 6  Mobility Do you have any goals in this area?  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  no  Neuro/Psych No problems in this area  Prior Studies Any changes since last visit?  no  Physicians involved in your care Any changes since last visit?  no   Family History  Problem Relation Age of Onset  . Heart disease    . Lung disease    . Diabetes    . Hypertension    . Hyperlipidemia Father   . Hypertension Father   . Arthritis Father   . Heart disease Father   . Asthma Mother   . Diabetes Mother   . Arthritis Mother   . Heart disease Mother    History   Social History  . Marital Status: Single    Spouse Name: N/A    Number of Children: N/A  . Years of Education: N/A   Social History Main Topics  . Smoking status: Never Smoker   . Smokeless tobacco: Never Used  . Alcohol Use: None  . Drug Use: None  . Sexual Activity: None   Other Topics Concern  . None   Social History Narrative  . None    Past Surgical History  Procedure Laterality Date  . Ulnar collateral ligament reconstruction    . Laporoscopic surgery      UTEREINE  . Knee arthroscopy     Past Medical History  Diagnosis Date  . Chronic headaches   . TMJ syndrome   . Back pain   . Lumbar sprain   . Migraines   . Myofascial pain   . Chronic pain syndrome   . Neck pain   . Endometriosis   . Cervicalgia   . Facet syndrome, lumbar   . Hypertension    BP 141/87  Pulse 70  Resp 16  Ht 5\' 9"  (1.753 m)  Wt 340 lb (154.223 kg)  BMI 50.19 kg/m2  SpO2 99%  Opioid Risk Score:   Fall Risk Score: Low Fall Risk (0-5 points) (patient educated handout declined)   Review of Systems  Musculoskeletal: Positive for arthralgias and myalgias.  Neurological: Positive for headaches.  All other systems reviewed and are negative.      Objective:   Physical Exam  Nursing note and vitals reviewed. Constitutional: She is oriented to person, place, and time. She appears well-developed and well-nourished.  Morbid Obesity  HENT:  Head: Normocephalic and atraumatic.  Neck: Normal  range of motion. Neck supple.  Cardiovascular: Normal rate and regular rhythm.   Pulmonary/Chest: Effort normal and breath sounds normal.  Musculoskeletal:  Normal Muscle Bulk and Muscle Testing Reveals: Upper Extremities: Full ROM and MUscle Strength 5/5 Lumbar Paraspinal Tenderness: L-3-L-5 Lower Extremities: Full ROM and Muscle Strength 5/5 Arises from chair with ease Narrow Based gait  Neurological: She is alert and oriented to person, place, and time.  Skin: Skin is warm and dry.  Psychiatric: She has a normal mood and affect.          Assessment & Plan:  1. History of chronic migraines with aura and cervicalgia with myofascial pain.Refilled: Dilaudid 4mg  1-2 tablets Daily PRN #12. Was given a second script. 2. 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 Continue with Exercise and use Heat therapy. Refilled:  Oxycodone 5 mg one tablet every 8 hours as needed #90. Was given a second script. 4. Osteoarthritis of the left knee greater than right with meniscal. Chondromalacia patellae changes. Will be making an appointment with Dr. Christianne Dolin in [place]. F/U with Dr. Naaman Plummer for Knee Injection  20 minutes of face to face patient care time was spent during this visit. All questions were encouraged and answered.   F/U in 2 months

## 2013-10-10 ENCOUNTER — Encounter: Payer: Self-pay | Admitting: Physical Medicine & Rehabilitation

## 2013-10-10 ENCOUNTER — Encounter: Payer: Medicare HMO | Attending: Physical Medicine & Rehabilitation | Admitting: Physical Medicine & Rehabilitation

## 2013-10-10 VITALS — BP 155/72 | HR 68 | Resp 16 | Ht 69.0 in | Wt 341.0 lb

## 2013-10-10 DIAGNOSIS — G43109 Migraine with aura, not intractable, without status migrainosus: Secondary | ICD-10-CM

## 2013-10-10 DIAGNOSIS — IMO0001 Reserved for inherently not codable concepts without codable children: Secondary | ICD-10-CM | POA: Diagnosis present

## 2013-10-10 DIAGNOSIS — M26609 Unspecified temporomandibular joint disorder, unspecified side: Secondary | ICD-10-CM

## 2013-10-10 DIAGNOSIS — M47817 Spondylosis without myelopathy or radiculopathy, lumbosacral region: Secondary | ICD-10-CM

## 2013-10-10 DIAGNOSIS — M47816 Spondylosis without myelopathy or radiculopathy, lumbar region: Secondary | ICD-10-CM

## 2013-10-10 DIAGNOSIS — M1710 Unilateral primary osteoarthritis, unspecified knee: Secondary | ICD-10-CM

## 2013-10-10 DIAGNOSIS — M797 Fibromyalgia: Secondary | ICD-10-CM

## 2013-10-10 DIAGNOSIS — M170 Bilateral primary osteoarthritis of knee: Secondary | ICD-10-CM

## 2013-10-10 MED ORDER — OXYCODONE HCL 5 MG PO TABA: 1.0000 | ORAL_TABLET | Freq: Three times a day (TID) | ORAL | Status: DC | PRN

## 2013-10-10 MED ORDER — HYDROMORPHONE HCL 4 MG PO TABS: 4.0000 mg | ORAL_TABLET | Freq: Every day | ORAL | Status: DC | PRN

## 2013-10-10 NOTE — Progress Notes (Signed)
Subjective:    Patient ID: Shannon Meadows, female    DOB: July 26, 1970, 43 y.o.   MRN: [id number]  HPI  Shannon Meadows is back regarding her chronic pain. Her pain levels have been generally stable. She has had a  more pain recently in her left knee.  I injected her left knee in May---the injection provided about 3 months of relief.   She continues on her other medications as prescribed.  She is working full time and finishing up school as well.       Pain Inventory Average Pain 5 Pain Right Now 5 My pain is intermittent, sharp, burning, dull, stabbing, tingling and aching  In the last 24 hours, has pain interfered with the following? General activity 9 Relation with others 8 Enjoyment of life 9 What TIME of day is your pain at its worst? depends Sleep (in general) Poor  Pain is worse with: walking, bending, sitting, inactivity, standing, unsure and some activites Pain improves with: rest, heat/ice, pacing activities, medication, TENS and injections Relief from Meds: 6  Mobility walk without assistance ability to climb steps?  yes do you drive?  yes transfers alone Do you have any goals in this area?  no  Function employed # of hrs/week 48+ Do you have any goals in this area?  yes  Neuro/Psych numbness tingling spasms dizziness  Prior Studies Any changes since last visit?  no  Physicians involved in your care Any changes since last visit?  no   Family History  Problem Relation Age of Onset  . Heart disease    . Lung disease    . Diabetes    . Hypertension    . Hyperlipidemia Father   . Hypertension Father   . Arthritis Father   . Heart disease Father   . Asthma Mother   . Diabetes Mother   . Arthritis Mother   . Heart disease Mother    History   Social History  . Marital Status: Single    Spouse Name: N/A    Number of Children: N/A  . Years of Education: N/A   Social History Main Topics  . Smoking status: Never Smoker   . Smokeless tobacco: Never  Used  . Alcohol Use: None  . Drug Use: None  . Sexual Activity: None   Other Topics Concern  . None   Social History Narrative  . None   Past Surgical History  Procedure Laterality Date  . Ulnar collateral ligament reconstruction    . Laporoscopic surgery      UTEREINE  . Knee arthroscopy     Past Medical History  Diagnosis Date  . Chronic headaches   . TMJ syndrome   . Back pain   . Lumbar sprain   . Migraines   . Myofascial pain   . Chronic pain syndrome   . Neck pain   . Endometriosis   . Cervicalgia   . Facet syndrome, lumbar   . Hypertension    BP 155/72  Pulse 68  Resp 16  Ht 5\' 9"  (1.753 m)  Wt 341 lb (154.677 kg)  BMI 50.33 kg/m2  SpO2 99%  Opioid Risk Score:   Fall Risk Score: Low Fall Risk (0-5 points)    Review of Systems  Respiratory: Positive for cough.   Cardiovascular: Positive for leg swelling.  Gastrointestinal: Positive for nausea.  Neurological: Positive for dizziness and numbness.       Tingling,spasms  All other systems reviewed and are negative.  Objective:   Physical Exam  Constitutional: She is oriented to person, place, and time. She appears well-developed and well-nourished.  Morbidly obese  HENT: dentition is poor.  Head: Normocephalic and atraumatic.  Eyes: EOM are normal. Pupils are equal, round, and reactive to light.  Cardiovascular: Normal rate.  Pulmonary/Chest: Effort normal and breath sounds normal.  Abdominal: Soft.  Musculoskeletal: head forward position Cervical back: She exhibits decreased range of motion, tenderness, bony tenderness and spasm, particularly at the upper [place]'s. Lumbar back: She exhibits decreased range of motion, tenderness, bony tenderness and pain. continued Tp's are seen in the mid traps and mid spenius capitis muscles tdoay.  Back:low back generally tender.decreased flexion/extension today.  Neurological: She is alert and oriented to person, place, and time. Gait is improved. Less  antalgia seen.    Assessment & Plan:   ASSESSMENT:  1. History of chronic migraines with aura and cervicalgia with shoulder girdle/cervical myofascial pain.  2. Temporomandibular joint dysfunction.  3. 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  4. Osteoarthritis of the left knee greater than right with meniscal. Chondromalacia patellae  changes. Needs ortho follow  5. Plantar fibroids.    PLAN:  1. After informed consent , i injected bilateral mid/anterior traps each with 2cc of 1% lidocaine--for a total of 3 TPI's (2 right and 1 left). Again stressed posture and stretching especially during her long shifts at work.  2. After informed consent and preparation of the skin with betadine and isopropyl alcohol, I injected 6mg  (1cc) of celestone and 4cc of 1% lidocaine into the left knee via antero-medial approach. Additionally, aspiration was performed prior to injection. The patient tolerated well, and no complications were encountered. Afterward the area was cleaned and dressed. Post- injection instructions were provided. Advised orthopedic follow up in [place]. I will not inject her knee again unless she sees someone.  3. I refilled her Dilaudid 4 mg #12 and oxycodone 5 mg #90 today. She was given prescriptions for all these medications for an additional month.  4. Refilled lyrica toay.  5. I will see her back here in about 2 months.15 minutes of face to face patient care time were spent during this visit. All questions were encouraged and answered.

## 2013-12-12 ENCOUNTER — Other Ambulatory Visit: Payer: Self-pay | Admitting: Physical Medicine & Rehabilitation

## 2013-12-12 ENCOUNTER — Encounter: Payer: Self-pay | Admitting: Physical Medicine & Rehabilitation

## 2013-12-12 ENCOUNTER — Encounter: Payer: Medicare HMO | Attending: Physical Medicine & Rehabilitation | Admitting: Physical Medicine & Rehabilitation

## 2013-12-12 VITALS — BP 129/66 | HR 93 | Resp 14 | Ht 69.0 in | Wt 338.0 lb

## 2013-12-12 DIAGNOSIS — M2660 Temporomandibular joint disorder, unspecified: Secondary | ICD-10-CM | POA: Diagnosis present

## 2013-12-12 DIAGNOSIS — G43109 Migraine with aura, not intractable, without status migrainosus: Secondary | ICD-10-CM | POA: Insufficient documentation

## 2013-12-12 DIAGNOSIS — M170 Bilateral primary osteoarthritis of knee: Secondary | ICD-10-CM | POA: Insufficient documentation

## 2013-12-12 DIAGNOSIS — M797 Fibromyalgia: Secondary | ICD-10-CM | POA: Insufficient documentation

## 2013-12-12 DIAGNOSIS — Z79899 Other long term (current) drug therapy: Secondary | ICD-10-CM | POA: Diagnosis present

## 2013-12-12 DIAGNOSIS — M26609 Unspecified temporomandibular joint disorder, unspecified side: Secondary | ICD-10-CM

## 2013-12-12 DIAGNOSIS — Z5181 Encounter for therapeutic drug level monitoring: Secondary | ICD-10-CM | POA: Diagnosis present

## 2013-12-12 MED ORDER — HYDROMORPHONE HCL 4 MG PO TABS: 4.0000 mg | ORAL_TABLET | Freq: Every day | ORAL | Status: DC | PRN

## 2013-12-12 MED ORDER — OXYCODONE HCL 5 MG PO TABA: 1.0000 | ORAL_TABLET | Freq: Three times a day (TID) | ORAL | Status: DC | PRN

## 2013-12-12 NOTE — Progress Notes (Signed)
Subjective:    Patient ID: Shannon Meadows, female    DOB: 08/01/1970, 43 y.o.   MRN: [id number]  HPI   Shannon Meadows is back regarding her chronic pain. She is now seeing an orthopedist who injected her knees last week. He hopes to treat her conservatively.    She continues to struggle with myofascial pain in her shoulders and neck.  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  She is bringing her father back to [place] with her as things have gone sour at home. Her father has early dementia.   Pain Inventory Average Pain 6 Pain Right Now 5 My pain is intermittent, constant, sharp, burning, dull, stabbing, tingling and aching  In the last 24 hours, has pain interfered with the following? General activity 5 Relation with others 5 Enjoyment of life 5 What TIME of day is your pain at its worst? daytime Sleep (in general) Poor  Pain is worse with: walking, bending, sitting, inactivity, standing, unsure and some activites Pain improves with: rest, heat/ice, pacing activities, medication and injections Relief from Meds: 5  Mobility walk without assistance ability to climb steps?  yes do you drive?  yes Do you have any goals in this area?  no  Function employed # of hrs/week 48+ I need assistance with the following:  household duties Do you have any goals in this area?  yes  Neuro/Psych No problems in this area  Prior Studies Any changes since last visit?  yes x-rays  Physicians involved in your care Any changes since last visit?  yes Orthopedist Dr. Christianne Dolin   Family History  Problem Relation Age of Onset  . Heart disease    . Lung disease    . Diabetes    . Hypertension    . Hyperlipidemia Father   . Hypertension Father   . Arthritis Father   . Heart disease Father   . Asthma Mother   . Diabetes Mother   . Arthritis Mother   . Heart disease Mother    History   Social History  . Marital Status: Single    Spouse Name: N/A    Number of Children: N/A  .  Years of Education: N/A   Social History Main Topics  . Smoking status: Never Smoker   . Smokeless tobacco: Never Used  . Alcohol Use: Not on file  . Drug Use: Not on file  . Sexual Activity: Not on file   Other Topics Concern  . Not on file   Social History Narrative  . No narrative on file   Past Surgical History  Procedure Laterality Date  . Ulnar collateral ligament reconstruction    . Laporoscopic surgery      UTEREINE  . Knee arthroscopy     Past Medical History  Diagnosis Date  . Chronic headaches   . TMJ syndrome   . Back pain   . Lumbar sprain   . Migraines   . Myofascial pain   . Chronic pain syndrome   . Neck pain   . Endometriosis   . Cervicalgia   . Facet syndrome, lumbar   . Hypertension    There were no vitals taken for this visit.  Opioid Risk Score:   Fall Risk Score:   Review of Systems  All other systems reviewed and are negative.      Objective:   Physical Exam  Constitutional: She is oriented to person, place, and time. She appears well-developed and well-nourished.  Morbidly  obese  HENT: dentition is poor.  Head: Normocephalic and atraumatic.  Eyes: EOM are normal. Pupils are equal, round, and reactive to light.  Cardiovascular: Normal rate.  Pulmonary/Chest: Effort normal and breath sounds normal.  Abdominal: Soft.  Musculoskeletal: head forward position Cervical back: She exhibits decreased range of motion, tenderness, bony tenderness and spasm, particularly at the upper [place]'s. Lumbar back: She exhibits decreased range of motion, tenderness, bony tenderness and pain. continued Tp's are seen in the mid traps and upper traps/spenius capitis muscles tdoay bilaterally.  Back:low back generally tender.decreased flexion/extension today.  Neurological: She is alert and oriented to person, place, and time. Gait is improved. Less antalgia seen.  Assessment & Plan:   ASSESSMENT:  1. History of chronic migraines with aura and cervicalgia  with shoulder girdle/cervical myofascial pain.  2. Temporomandibular joint dysfunction.  3. 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  4. Osteoarthritis of the left knee greater than right with meniscal. Chondromalacia patellae.   5. Plantar fibroids.    PLAN:  1. After informed consent , i injected bilateral upper and mid traps each with 2cc of 1% lidocaine--for a total of 4 TPI's (2 right and 2 left). Pt experienced relief before leaving the office. Post-injection instructions were provided. 2. Continue with orthopedic follow up as she's doing. It is more ideal that her care for her knees is closer to home.  3. I refilled her Dilaudid 4 mg #12 and oxycodone 5 mg #90 today. She was given prescriptions for all these medications for an additional month.  4. lyrica dose will remain the same. .  5. I will see her back here in about 2 months.15 minutes of face to face patient care time were spent during this visit. All questions were encouraged and answered.

## 2013-12-12 NOTE — Patient Instructions (Signed)
PLEASE CALL ME WITH ANY PROBLEMS OR QUESTIONS (#[phone]).      

## 2013-12-13 LAB — PMP ALCOHOL METABOLITE (ETG): ETGU: NEGATIVE ng/mL

## 2013-12-16 ENCOUNTER — Other Ambulatory Visit: Payer: Self-pay | Admitting: Physical Medicine & Rehabilitation

## 2013-12-17 NOTE — Telephone Encounter (Signed)
Refill called to [facility] for Lyrica.

## 2013-12-18 LAB — OPIATES/OPIOIDS (LC/MS-MS)
CODEINE URINE: NEGATIVE ng/mL (ref <50)
Hydrocodone: NEGATIVE ng/mL (ref <50)
Hydromorphone: 236 ng/mL (ref <50)
Morphine Urine: NEGATIVE ng/mL (ref <50)
Norhydrocodone, Ur: NEGATIVE ng/mL (ref <50)
Noroxycodone, Ur: 1859 ng/mL (ref <50)
OXYMORPHONE, URINE: 490 ng/mL (ref <50)
Oxycodone, ur: 168 ng/mL (ref <50)

## 2013-12-18 LAB — OXYCODONE, URINE (LC/MS-MS)
Noroxycodone, Ur: 1859 ng/mL (ref <50)
OXYCODONE, UR: 168 ng/mL (ref <50)
OXYMORPHONE, URINE: 490 ng/mL (ref <50)

## 2013-12-20 LAB — PRESCRIPTION MONITORING PROFILE (SOLSTAS)
Amphetamine/Meth: NEGATIVE ng/mL
BENZODIAZEPINE SCREEN, URINE: NEGATIVE ng/mL
Barbiturate Screen, Urine: NEGATIVE ng/mL
Buprenorphine, Urine: NEGATIVE ng/mL
COCAINE METABOLITES: NEGATIVE ng/mL
Cannabinoid Scrn, Ur: NEGATIVE ng/mL
Carisoprodol, Urine: NEGATIVE ng/mL
Creatinine, Urine: 165.29 mg/dL (ref >20.0)
ECSTASY: NEGATIVE ng/mL
Fentanyl, Ur: NEGATIVE ng/mL
METHADONE SCREEN, URINE: NEGATIVE ng/mL
Meperidine, Ur: NEGATIVE ng/mL
Nitrites, Initial: NEGATIVE ug/mL
PH URINE, INITIAL: 7.8 pH (ref 4.5–8.9)
Propoxyphene: NEGATIVE ng/mL
TRAMADOL UR: NEGATIVE ng/mL
Tapentadol, urine: NEGATIVE ng/mL
Zolpidem, Urine: NEGATIVE ng/mL

## 2013-12-31 NOTE — Progress Notes (Signed)
Urine drug screen for this encounter is consistent for prescribed medications.   

## 2014-02-06 ENCOUNTER — Encounter: Payer: Medicare HMO | Attending: Physical Medicine & Rehabilitation | Admitting: Physical Medicine & Rehabilitation

## 2014-02-06 ENCOUNTER — Encounter: Payer: Self-pay | Admitting: Physical Medicine & Rehabilitation

## 2014-02-06 VITALS — BP 132/70 | HR 92 | Resp 14

## 2014-02-06 DIAGNOSIS — Z79899 Other long term (current) drug therapy: Secondary | ICD-10-CM | POA: Diagnosis present

## 2014-02-06 DIAGNOSIS — G43109 Migraine with aura, not intractable, without status migrainosus: Secondary | ICD-10-CM | POA: Diagnosis present

## 2014-02-06 DIAGNOSIS — M2660 Temporomandibular joint disorder, unspecified: Secondary | ICD-10-CM | POA: Diagnosis not present

## 2014-02-06 DIAGNOSIS — M797 Fibromyalgia: Secondary | ICD-10-CM | POA: Diagnosis present

## 2014-02-06 DIAGNOSIS — Z5181 Encounter for therapeutic drug level monitoring: Secondary | ICD-10-CM | POA: Insufficient documentation

## 2014-02-06 DIAGNOSIS — M170 Bilateral primary osteoarthritis of knee: Secondary | ICD-10-CM | POA: Insufficient documentation

## 2014-02-06 DIAGNOSIS — M26609 Unspecified temporomandibular joint disorder, unspecified side: Secondary | ICD-10-CM

## 2014-02-06 MED ORDER — OXYCODONE HCL 5 MG PO TABA: 1.0000 | ORAL_TABLET | Freq: Three times a day (TID) | ORAL | Status: DC | PRN

## 2014-02-06 MED ORDER — HYDROMORPHONE HCL 4 MG PO TABS: 4.0000 mg | ORAL_TABLET | Freq: Every day | ORAL | Status: DC | PRN

## 2014-02-06 NOTE — Progress Notes (Signed)
Subjective:    Patient ID: Shannon Meadows, female    DOB: 10/03/1970, 44 y.o.   MRN: [id number]  HPI   Shannon Meadows is back regarding her chronic pain. She moved her dad up to [place]. Unfortunately he had an infection in the knee which required the removal of hardware. This has added to her stress levels.   She is having more migraine headaches which seem to worsen when her stress increases.  She is working on new housing for her and her father.   She continues on her medications per our plan.      Pain Inventory Average Pain 5 Pain Right Now 6 My pain is intermittent, constant, sharp, burning, dull, stabbing, tingling and aching  In the last 24 hours, has pain interfered with the following? General activity 8 Relation with others 9 Enjoyment of life 6 What TIME of day is your pain at its worst? varies Sleep (in general) Fair  Pain is worse with: walking, inactivity, standing and some activites Pain improves with: rest, heat/ice, pacing activities, medication, TENS and injections Relief from Meds: 4  Mobility walk without assistance ability to climb steps?  yes do you drive?  yes Do you have any goals in this area?  no  Function employed # of hrs/week 48+ Do you have any goals in this area?  yes  Neuro/Psych numbness tingling trouble walking  Prior Studies Any changes since last visit?  no  Physicians involved in your care Any changes since last visit?  no   Family History  Problem Relation Age of Onset  . Heart disease    . Lung disease    . Diabetes    . Hypertension    . Hyperlipidemia Father   . Hypertension Father   . Arthritis Father   . Heart disease Father   . Asthma Mother   . Diabetes Mother   . Arthritis Mother   . Heart disease Mother    History   Social History  . Marital Status: Single    Spouse Name: N/A    Number of Children: N/A  . Years of Education: N/A   Social History Main Topics  . Smoking status: Never Smoker   . Smokeless  tobacco: Never Used  . Alcohol Use: None  . Drug Use: None  . Sexual Activity: None   Other Topics Concern  . None   Social History Narrative   Past Surgical History  Procedure Laterality Date  . Ulnar collateral ligament reconstruction    . Laporoscopic surgery      UTEREINE  . Knee arthroscopy     Past Medical History  Diagnosis Date  . Chronic headaches   . TMJ syndrome   . Back pain   . Lumbar sprain   . Migraines   . Myofascial pain   . Chronic pain syndrome   . Neck pain   . Endometriosis   . Cervicalgia   . Facet syndrome, lumbar   . Hypertension    BP 132/70 mmHg  Pulse 92  Resp 14  SpO2 96%  Opioid Risk Score:   Fall Risk Score: Low Fall Risk (0-5 points) (pt declined information) Review of Systems  Constitutional:       Weight gain  Cardiovascular: Positive for leg swelling.  Gastrointestinal: Positive for nausea, vomiting and diarrhea.  Musculoskeletal: Positive for gait problem.  Skin: Positive for rash.  Neurological: Positive for numbness.       Tingling  Hematological: Bruises/bleeds easily.  All  other systems reviewed and are negative.      Objective:   Physical Exam  Constitutional: She is oriented to person, place, and time. She appears well-developed and well-nourished.  Morbidly obese  HENT: dentition remains poor Head: Normocephalic and atraumatic.  Eyes: EOM are normal. Pupils are equal, round, and reactive to light.  Cardiovascular: Normal rate.  Pulmonary/Chest: Effort normal and breath sounds normal.  Abdominal: Soft.  Musculoskeletal: head forward position Cervical back: She exhibits decreased range of motion, tenderness, bony tenderness and spasm, particularly at the upper [place]'s. Lumbar back: She exhibits decreased range of motion, tenderness, bony tenderness and pain. continued Tp's are seen in the mid traps and upper traps/spenius capitis muscles tdoay bilaterally.  Back:low back generally tender.decreased flexion/extension  today.  Neurological: She is alert and oriented to person, place, and time. Gait is improved. Less antalgia seen.  Psych: mood is good. Pleasant.  Assessment & Plan:   ASSESSMENT:  1. History of chronic migraines with aura and cervicalgia with shoulder girdle/cervical myofascial pain.  2. Temporomandibular joint dysfunction.  3. 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  4. Osteoarthritis of the left knee greater than right with meniscal. Chondromalacia patellae.  5. Plantar fibroids.   PLAN:  1. After informed consent , i injected bilateral  mid traps (2) and splenius capitis (2) each with 2cc of 1% lidocaine--for a total of 4 TPI's  . Pt experienced relief before leaving the office. Post-injection instructions were provided.   2. Continue with orthopedic follow up.   3. I refilled her Dilaudid 4 mg #12 and oxycodone 5 mg #90 today. She was given prescriptions for all these medications for an additional month. She continues to remain compliant with our program. 4. Continue trigger point injections.  5. I will see her back here in about 2 months.15 minutes of face to face patient care time were spent during this visit. All questions were encouraged and answered.

## 2014-02-06 NOTE — Patient Instructions (Signed)
PLEASE CALL ME WITH ANY PROBLEMS OR QUESTIONS (#[phone]).      

## 2014-02-16 ENCOUNTER — Telehealth: Payer: Self-pay
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
## 2014-03-06 ENCOUNTER — Other Ambulatory Visit: Payer: Self-pay | Admitting: Physical Medicine & Rehabilitation

## 2014-03-13 ENCOUNTER — Telehealth: Payer: Self-pay | Admitting: *Deleted

## 2014-03-13 DIAGNOSIS — M26609 Unspecified temporomandibular joint disorder, unspecified side: Secondary | ICD-10-CM

## 2014-03-13 DIAGNOSIS — M797 Fibromyalgia: Secondary | ICD-10-CM

## 2014-03-13 NOTE — Telephone Encounter (Signed)
I forgot about that, i'm sorry. We can allow her to pick up rx's this time only.  This is the LAST time
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
## 2014-03-13 NOTE — Telephone Encounter (Signed)
I am confused. She has rx's through 04/07/14.  She should be coming in for an appt next month. Why is she calling, asking for rx'es? I thought her father was living in [place] with her after his major illness?  I am not giving her addnl rx'es beyond the 2 months at a time we are already giving her

## 2014-03-13 NOTE — Telephone Encounter (Signed)
Pt called, she resides in [place]. Asking about her refills for Dilaudid and Oxycodone. Says her father can come by and p/u the scripts

## 2014-03-16 NOTE — Telephone Encounter (Signed)
Pt called again 03/16/2014, asking for f/u

## 2014-03-17 MED ORDER — OXYCODONE HCL 5 MG PO TABA: 1.0000 | ORAL_TABLET | Freq: Three times a day (TID) | ORAL | Status: DC | PRN

## 2014-03-17 MED ORDER — HYDROMORPHONE HCL 4 MG PO TABS: 4.0000 mg | ORAL_TABLET | Freq: Every day | ORAL | Status: DC | PRN

## 2014-03-17 NOTE — Telephone Encounter (Signed)
Rxs printed for Dr Naaman Plummer to sign.  This will get her through until her next appt 04/10/14..I have left a voicemail for [place] on her phone that the rx will be signed and can be picked up (most likely by her father) but this is a one time situation and all others will need appt.

## 2014-04-10 ENCOUNTER — Encounter
Payer: Managed Care, Other (non HMO) | Attending: Physical Medicine & Rehabilitation | Admitting: Physical Medicine & Rehabilitation

## 2014-04-10 ENCOUNTER — Encounter: Payer: Self-pay | Admitting: Physical Medicine & Rehabilitation

## 2014-04-10 ENCOUNTER — Other Ambulatory Visit: Payer: Self-pay | Admitting: Physical Medicine & Rehabilitation

## 2014-04-10 VITALS — BP 117/77 | HR 66 | Resp 14

## 2014-04-10 DIAGNOSIS — M170 Bilateral primary osteoarthritis of knee: Secondary | ICD-10-CM

## 2014-04-10 DIAGNOSIS — G43109 Migraine with aura, not intractable, without status migrainosus: Secondary | ICD-10-CM

## 2014-04-10 DIAGNOSIS — Z5181 Encounter for therapeutic drug level monitoring: Secondary | ICD-10-CM

## 2014-04-10 DIAGNOSIS — M2660 Temporomandibular joint disorder, unspecified: Secondary | ICD-10-CM | POA: Diagnosis not present

## 2014-04-10 DIAGNOSIS — M797 Fibromyalgia: Secondary | ICD-10-CM | POA: Diagnosis present

## 2014-04-10 DIAGNOSIS — M26609 Unspecified temporomandibular joint disorder, unspecified side: Secondary | ICD-10-CM

## 2014-04-10 DIAGNOSIS — M47816 Spondylosis without myelopathy or radiculopathy, lumbar region: Secondary | ICD-10-CM

## 2014-04-10 DIAGNOSIS — G894 Chronic pain syndrome: Secondary | ICD-10-CM

## 2014-04-10 DIAGNOSIS — Z79899 Other long term (current) drug therapy: Secondary | ICD-10-CM | POA: Insufficient documentation

## 2014-04-10 MED ORDER — OXYCODONE HCL 5 MG PO TABA: 1.0000 | ORAL_TABLET | Freq: Three times a day (TID) | ORAL | Status: DC | PRN

## 2014-04-10 MED ORDER — TOPIRAMATE 25 MG PO TABS: 25.0000 mg | ORAL_TABLET | Freq: Every day | ORAL | Status: DC

## 2014-04-10 MED ORDER — HYDROMORPHONE HCL 4 MG PO TABS: 4.0000 mg | ORAL_TABLET | Freq: Every day | ORAL | Status: DC | PRN
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
## 2014-04-10 NOTE — Patient Instructions (Signed)
PLEASE CALL ME WITH ANY PROBLEMS OR QUESTIONS (#[phone]).      

## 2014-04-11 LAB — PMP ALCOHOL METABOLITE (ETG): Ethyl Glucuronide (EtG): NEGATIVE ng/mL

## 2014-04-17 LAB — OPIATES/OPIOIDS (LC/MS-MS)
CODEINE URINE: NEGATIVE ng/mL (ref <50)
Hydrocodone: NEGATIVE ng/mL (ref <50)
Hydromorphone: 342 ng/mL (ref <50)
Morphine Urine: NEGATIVE ng/mL (ref <50)
Norhydrocodone, Ur: NEGATIVE ng/mL (ref <50)
Noroxycodone, Ur: 800 ng/mL (ref <50)
OXYCODONE, UR: 384 ng/mL (ref <50)
Oxymorphone: 230 ng/mL (ref <50)

## 2014-04-17 LAB — OXYCODONE, URINE (LC/MS-MS)
NOROXYCODONE, UR: 800 ng/mL (ref <50)
OXYCODONE, UR: 384 ng/mL (ref <50)
Oxymorphone: 230 ng/mL (ref <50)

## 2014-04-18 LAB — PRESCRIPTION MONITORING PROFILE (SOLSTAS)
AMPHETAMINE/METH: NEGATIVE ng/mL
BARBITURATE SCREEN, URINE: NEGATIVE ng/mL
BENZODIAZEPINE SCREEN, URINE: NEGATIVE ng/mL
BUPRENORPHINE, URINE: NEGATIVE ng/mL
CANNABINOID SCRN UR: NEGATIVE ng/mL
CARISOPRODOL, URINE: NEGATIVE ng/mL
Cocaine Metabolites: NEGATIVE ng/mL
Creatinine, Urine: 34.49 mg/dL (ref >20.0)
Fentanyl, Ur: NEGATIVE ng/mL
MDMA URINE: NEGATIVE ng/mL
MEPERIDINE UR: NEGATIVE ng/mL
METHADONE SCREEN, URINE: NEGATIVE ng/mL
Nitrites, Initial: NEGATIVE ug/mL
PH URINE, INITIAL: 6 pH (ref 4.5–8.9)
Propoxyphene: NEGATIVE ng/mL
Tapentadol, urine: NEGATIVE ng/mL
Tramadol Scrn, Ur: NEGATIVE ng/mL
Zolpidem, Urine: NEGATIVE ng/mL

## 2014-04-30 NOTE — Progress Notes (Signed)
Urine drug screen for this encounter is consistent for prescribed medication 

## 2014-06-12 ENCOUNTER — Encounter
Payer: Managed Care, Other (non HMO) | Attending: Physical Medicine & Rehabilitation | Admitting: Physical Medicine & Rehabilitation

## 2014-06-12 ENCOUNTER — Encounter: Payer: Self-pay | Admitting: Physical Medicine & Rehabilitation

## 2014-06-12 VITALS — BP 108/70 | HR 61 | Resp 14

## 2014-06-12 DIAGNOSIS — M170 Bilateral primary osteoarthritis of knee: Secondary | ICD-10-CM | POA: Insufficient documentation

## 2014-06-12 DIAGNOSIS — M2660 Temporomandibular joint disorder, unspecified: Secondary | ICD-10-CM | POA: Diagnosis not present

## 2014-06-12 DIAGNOSIS — Z79899 Other long term (current) drug therapy: Secondary | ICD-10-CM | POA: Diagnosis present

## 2014-06-12 DIAGNOSIS — G43109 Migraine with aura, not intractable, without status migrainosus: Secondary | ICD-10-CM | POA: Diagnosis not present

## 2014-06-12 DIAGNOSIS — Z5181 Encounter for therapeutic drug level monitoring: Secondary | ICD-10-CM | POA: Insufficient documentation

## 2014-06-12 DIAGNOSIS — M1712 Unilateral primary osteoarthritis, left knee: Secondary | ICD-10-CM | POA: Diagnosis not present

## 2014-06-12 DIAGNOSIS — M797 Fibromyalgia: Secondary | ICD-10-CM | POA: Insufficient documentation

## 2014-06-12 DIAGNOSIS — M47816 Spondylosis without myelopathy or radiculopathy, lumbar region: Secondary | ICD-10-CM

## 2014-06-12 DIAGNOSIS — M26609 Unspecified temporomandibular joint disorder, unspecified side: Secondary | ICD-10-CM

## 2014-06-12 MED ORDER — HYDROMORPHONE HCL 4 MG PO TABS: 4.0000 mg | ORAL_TABLET | Freq: Every day | ORAL | Status: DC | PRN

## 2014-06-12 MED ORDER — OXYCODONE HCL 5 MG PO TABA: 1.0000 | ORAL_TABLET | Freq: Three times a day (TID) | ORAL | Status: DC | PRN

## 2014-06-12 MED ORDER — TOPIRAMATE 100 MG PO TABS: 100.0000 mg | ORAL_TABLET | Freq: Every day | ORAL | Status: DC

## 2014-06-12 NOTE — Progress Notes (Signed)
Subjective:    Patient ID: Shannon Meadows, female    DOB: [birth year], 44 y.o.   MRN: [id number]  HPI   Shannon Meadows is back regarding her chronic pain. She has seen ortho regarding her knees. She has had the series of 3 synvisc injections without much result.   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   At last visit, we started topamax which seems to have helped. She is taking 50mg  before she goes to sleep. She doesn't feel that the headaches aren't lasting as long--a day to 1.5 days. They have dropped her headache intensity by 25%.    Pain Inventory Average Pain 5 Pain Right Now 5 My pain is intermittent, constant, sharp, burning, dull, stabbing, tingling and aching  In the last 24 hours, has pain interfered with the following? General activity 7 Relation with others 7 Enjoyment of life 6 What TIME of day is your pain at its worst? daytime Sleep (in general) Fair  Pain is worse with: some activites Pain improves with: rest, pacing activities, medication, TENS and injections Relief from Meds: 5  Mobility walk without assistance ability to climb steps?  yes do you drive?  yes Do you have any goals in this area?  yes  Function employed # of hrs/week 48+ Do you have any goals in this area?  yes  Neuro/Psych No problems in this area  Prior Studies Any changes since last visit?  no  Physicians involved in your care Any changes since last visit?  no   Family History  Problem Relation Age of Onset  . Heart disease    . Lung disease    . Diabetes    . Hypertension    . Hyperlipidemia Father   . Hypertension Father   . Arthritis Father   . Heart disease Father   . Asthma Mother   . Diabetes Mother   . Arthritis Mother   . Heart disease Mother    History   Social History  . Marital Status: Single    Spouse Name: N/A  . Number of Children: N/A  . Years of Education: N/A   Social History Main Topics  . Smoking status: Never Smoker   . Smokeless  tobacco: Never Used  . Alcohol Use: Not on file  . Drug Use: Not on file  . Sexual Activity: Not on file   Other Topics Concern  . None   Social History Narrative   Past Surgical History  Procedure Laterality Date  . Ulnar collateral ligament reconstruction    . Laporoscopic surgery      UTEREINE  . Knee arthroscopy     Past Medical History  Diagnosis Date  . Chronic headaches   . TMJ syndrome   . Back pain   . Lumbar sprain   . Migraines   . Myofascial pain   . Chronic pain syndrome   . Neck pain   . Endometriosis   . Cervicalgia   . Facet syndrome, lumbar   . Hypertension    BP 108/70 mmHg  Pulse 61  Resp 14  SpO2 99%  Opioid Risk Score:   Fall Risk Score: Low Fall Risk (0-5 points)`1  Depression screen PHQ 2/9  Depression screen PHQ 2/9 04/10/2014  Decreased Interest 0  Down, Depressed, Hopeless 1  PHQ - 2 Score 1  Altered sleeping 2  Tired, decreased energy 2  Change in appetite 1  Feeling bad or failure about yourself  0  Trouble concentrating 0  Moving slowly or fidgety/restless 0  Suicidal thoughts 0  PHQ-9 Score 6     Review of Systems  All other systems reviewed and are negative.      Objective:   Physical Exam  Constitutional: She is oriented to person, place, and time. She appears well-developed and well-nourished.  Morbidly obese  HENT: dentition remains poor  Head: Normocephalic and atraumatic.  Eyes: EOM are normal. Pupils are equal, round, and reactive to light.  Cardiovascular: Normal rate.  Pulmonary/Chest: Effort normal and breath sounds normal.  Abdominal: Soft.  Musculoskeletal: head forward position Cervical back: She exhibits decreased range of motion, tenderness, bony tenderness and spasm, particularly at the upper [place]'s. Lumbar back: She exhibits decreased range of motion, tenderness, bony tenderness and pain. continued Tp's are seen in the mid traps and upper traps/spenius capitis muscles tdoay bilaterally once  again Back:low back generally tender.decreased flexion/extension today. Substantial antalgia left leg.   Neurological: She is alert and oriented to person, place, and time. Gait is improved. Less antalgia seen.  Psych: mood is good. Pleasant.  Assessment & Plan:   ASSESSMENT:  1. History of chronic migraines with aura and cervicalgia with shoulder girdle/cervical myofascial pain.  2. Temporomandibular joint dysfunction.  3. 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  4. Osteoarthritis of the left knee greater than right with meniscal. Chondromalacia patellae.  5. Plantar fibroids.   PLAN:  1. After informed consent , i injected bilateral mid traps (2) and splenius capitis (2) each with 2cc of 1% lidocaine--for a total of 4 TPI's . Pt experienced relief before leaving the office. Post-injection instructions were provided.  2. Ortho follow up for knees/ s/p synvisc.  May need knee replacement ultimately 3. I refilled her Dilaudid 4 mg #12 and oxycodone 5 mg #90 today. She was given prescriptions for all these medications for an additional month. I told Shannon Meadows that if her medications were lost again, that I would not replace them early. She understands her obligation to be responsible about her meds.  4.Increase topamax to 100mg  qhs for migraine prophylaxis/  5. I will see her back here in about 2 months.15 minutes of face to face patient care time were spent during this visit. All questions were encouraged and answered.

## 2014-06-12 NOTE — Patient Instructions (Signed)
PLEASE CALL ME WITH ANY PROBLEMS OR QUESTIONS (#[phone]).      

## 2014-06-29 ENCOUNTER — Other Ambulatory Visit: Payer: Self-pay | Admitting: Physical Medicine & Rehabilitation

## 2014-08-14 ENCOUNTER — Other Ambulatory Visit: Payer: Self-pay | Admitting: Physical Medicine & Rehabilitation

## 2014-08-14 ENCOUNTER — Encounter
Payer: Managed Care, Other (non HMO) | Attending: Physical Medicine & Rehabilitation | Admitting: Physical Medicine & Rehabilitation

## 2014-08-14 ENCOUNTER — Encounter: Payer: Self-pay | Admitting: Physical Medicine & Rehabilitation

## 2014-08-14 VITALS — BP 121/62 | HR 85 | Resp 14

## 2014-08-14 DIAGNOSIS — Z79899 Other long term (current) drug therapy: Secondary | ICD-10-CM | POA: Insufficient documentation

## 2014-08-14 DIAGNOSIS — M797 Fibromyalgia: Secondary | ICD-10-CM | POA: Diagnosis not present

## 2014-08-14 DIAGNOSIS — Z5181 Encounter for therapeutic drug level monitoring: Secondary | ICD-10-CM | POA: Insufficient documentation

## 2014-08-14 DIAGNOSIS — M2660 Temporomandibular joint disorder, unspecified: Secondary | ICD-10-CM | POA: Insufficient documentation

## 2014-08-14 DIAGNOSIS — M170 Bilateral primary osteoarthritis of knee: Secondary | ICD-10-CM | POA: Diagnosis present

## 2014-08-14 DIAGNOSIS — M26609 Unspecified temporomandibular joint disorder, unspecified side: Secondary | ICD-10-CM

## 2014-08-14 DIAGNOSIS — M47816 Spondylosis without myelopathy or radiculopathy, lumbar region: Secondary | ICD-10-CM

## 2014-08-14 DIAGNOSIS — M1712 Unilateral primary osteoarthritis, left knee: Secondary | ICD-10-CM

## 2014-08-14 DIAGNOSIS — G43109 Migraine with aura, not intractable, without status migrainosus: Secondary | ICD-10-CM | POA: Diagnosis not present

## 2014-08-14 MED ORDER — OXYCODONE HCL 5 MG PO TABA: 1.0000 | ORAL_TABLET | Freq: Three times a day (TID) | ORAL | Status: DC | PRN

## 2014-08-14 MED ORDER — HYDROMORPHONE HCL 4 MG PO TABS: 4.0000 mg | ORAL_TABLET | ORAL | Status: DC | PRN

## 2014-08-14 NOTE — Patient Instructions (Signed)
PLEASE CALL ME WITH ANY PROBLEMS OR QUESTIONS (#[phone]).  HAVE A GOOD DAY!    

## 2014-08-14 NOTE — Progress Notes (Signed)
Subjective:    Patient ID: Shannon Meadows, female    DOB: 20-Jun-1970, 44 y.o.   MRN: [id number]  HPI   Edwin is here in follow up of her chronic pain. She continues to work in [place] but is probably going to move back to [place] to appease her father. Her pain levels have been fairly stable. Nothing new has been accomplished regarding her knees. Her shoulder/neck pain tends to flair as the TPI's wear off. Her back bothers her the longer she works. She does take rest breaks and stretches when she can at work.   She continues on her oxycodone and dilaudid per our plan. Her counts are consistent. She uses the dilaudid for severe pain, h/a    Pain Inventory Average Pain 6 Pain Right Now 5 My pain is intermittent, constant, sharp, burning, dull, stabbing, tingling and aching  In the last 24 hours, has pain interfered with the following? General activity 4 Relation with others 4 Enjoyment of life 5 What TIME of day is your pain at its worst? varies Sleep (in general) Poor  Pain is worse with: walking, bending, sitting, inactivity and some activites Pain improves with: rest, heat/ice, pacing activities, medication, TENS and injections Relief from Meds: 7  Mobility walk without assistance ability to climb steps?  yes do you drive?  yes Do you have any goals in this area?  yes  Function employed # of hrs/week 48+ Do you have any goals in this area?  yes  Neuro/Psych No problems in this area  Prior Studies Any changes since last visit?  no  Physicians involved in your care Any changes since last visit?  no   Family History  Problem Relation Age of Onset  . Heart disease    . Lung disease    . Diabetes    . Hypertension    . Hyperlipidemia Father   . Hypertension Father   . Arthritis Father   . Heart disease Father   . Asthma Mother   . Diabetes Mother   . Arthritis Mother   . Heart disease Mother    History   Social History  . Marital Status: Single    Spouse Name: N/A   . Number of Children: N/A  . Years of Education: N/A   Social History Main Topics  . Smoking status: Never Smoker   . Smokeless tobacco: Never Used  . Alcohol Use: Not on file  . Drug Use: Not on file  . Sexual Activity: Not on file   Other Topics Concern  . None   Social History Narrative   Past Surgical History  Procedure Laterality Date  . Ulnar collateral ligament reconstruction    . Laporoscopic surgery      UTEREINE  . Knee arthroscopy     Past Medical History  Diagnosis Date  . Chronic headaches   . TMJ syndrome   . Back pain   . Lumbar sprain   . Migraines   . Myofascial pain   . Chronic pain syndrome   . Neck pain   . Endometriosis   . Cervicalgia   . Facet syndrome, lumbar   . Hypertension    BP 121/62 mmHg  Pulse 85  Resp 14  SpO2 97%  Opioid Risk Score:   Fall Risk Score:  `1  Depression screen PHQ 2/9  Depression screen PHQ 2/9 04/10/2014  Decreased Interest 0  Down, Depressed, Hopeless 1  PHQ - 2 Score 1  Altered sleeping 2  Tired, decreased energy 2  Change in appetite 1  Feeling bad or failure about yourself  0  Trouble concentrating 0  Moving slowly or fidgety/restless 0  Suicidal thoughts 0  PHQ-9 Score 6     Review of Systems  Constitutional:       Poor appetite  Gastrointestinal: Positive for nausea.  Skin: Positive for rash.  All other systems reviewed and are negative.      Objective:   Physical Exam   Constitutional: She is oriented to person, place, and time. She appears well-developed and well-nourished.  Morbidly obese  HENT: dentition remains poor  Head: Normocephalic and atraumatic.  Eyes: EOM are normal. Pupils are equal, round, and reactive to light.  Cardiovascular: Normal rate.  Pulmonary/Chest: Effort normal and breath sounds normal.  Abdominal: Soft.  Musculoskeletal: head forward position Cervical back: She exhibits decreased range of motion, tenderness, bony tenderness and spasm, particularly at  the upper [place]'s. Lumbar back: She exhibits decreased range of motion, tenderness, bony tenderness and pain. continued Tp's are seen in the bilateral mid traps and  Upper spenius capitis (2) muscles    Back:low back generally tender.decreased flexion/extension today. Substantial antalgia left leg.   Neurological: She is alert and oriented to person, place, and time. Gait is improved. Less antalgia seen.  Psych: mood is good. Pleasant.   Assessment & Plan:   ASSESSMENT:  1. History of chronic migraines with aura and cervicalgia with shoulder girdle/cervical myofascial pain.  2. Temporomandibular joint dysfunction.  3. 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  4. Osteoarthritis of the left knee greater than right with meniscal. Chondromalacia patellae.  5. Plantar fibroids.   PLAN:  1. After informed consent , i injected bilateral mid traps (2) and splenius capitis (2) each with 2cc of 1% lidocaine--for a total of 4 TPI's . Pt experienced relief before leaving the office. Post-injection instructions were provided.  2. Knee injections/plan per ortho.  3. I refilled her Dilaudid 4 mg #12 and oxycodone 5 mg #90 today. She was given prescriptions for all these medications for an additional month. I told Nellene that if her medications were lost again, that I would not replace them early. She understands her obligation to be responsible about her meds.  4. Continue topamax to 100mg  qhs for migraine prophylaxis  5. I will see her back here in about 2 months.15 minutes of face to face patient care time were spent during this visit. All questions were encouraged and answered.

## 2014-08-15 LAB — PMP ALCOHOL METABOLITE (ETG): Ethyl Glucuronide (EtG): NEGATIVE ng/mL

## 2014-08-19 LAB — OPIATES/OPIOIDS (LC/MS-MS)
CODEINE URINE: NEGATIVE ng/mL (ref <50)
Hydrocodone: NEGATIVE ng/mL (ref <50)
Hydromorphone: 1370 ng/mL (ref <50)
Morphine Urine: NEGATIVE ng/mL (ref <50)
NORHYDROCODONE, UR: NEGATIVE ng/mL (ref <50)
NOROXYCODONE, UR: NEGATIVE ng/mL — AB (ref <50)
OXYMORPHONE, URINE: NEGATIVE ng/mL — AB (ref <50)
Oxycodone, ur: NEGATIVE ng/mL — AB (ref <50)

## 2014-08-20 LAB — PRESCRIPTION MONITORING PROFILE (SOLSTAS)
AMPHETAMINE/METH: NEGATIVE ng/mL
BENZODIAZEPINE SCREEN, URINE: NEGATIVE ng/mL
Barbiturate Screen, Urine: NEGATIVE ng/mL
Buprenorphine, Urine: NEGATIVE ng/mL
CANNABINOID SCRN UR: NEGATIVE ng/mL
CARISOPRODOL, URINE: NEGATIVE ng/mL
COCAINE METABOLITES: NEGATIVE ng/mL
Creatinine, Urine: 237.41 mg/dL (ref >20.0)
ECSTASY: NEGATIVE ng/mL
FENTANYL URINE: NEGATIVE ng/mL
METHADONE SCREEN, URINE: NEGATIVE ng/mL
Meperidine, Ur: NEGATIVE ng/mL
Nitrites, Initial: NEGATIVE ug/mL
Oxycodone Screen, Ur: NEGATIVE ng/mL
PROPOXYPHENE: NEGATIVE ng/mL
TRAMADOL UR: NEGATIVE ng/mL
Tapentadol, urine: NEGATIVE ng/mL
ZOLPIDEM, URINE: NEGATIVE ng/mL
pH, Initial: 6.1 pH (ref 4.5–8.9)

## 2014-08-28 NOTE — Progress Notes (Signed)
Urine drug screen for this encounter is consistent for prescribed medication 

## 2014-09-25 ENCOUNTER — Other Ambulatory Visit: Payer: Self-pay | Admitting: Physical Medicine & Rehabilitation

## 2014-09-25 NOTE — Telephone Encounter (Signed)
This medication has not been prescribed since 2014, so you will need to approve or reject.

## 2014-09-29 ENCOUNTER — Other Ambulatory Visit: Payer: Self-pay | Admitting: Physical Medicine & Rehabilitation

## 2014-09-29 ENCOUNTER — Other Ambulatory Visit: Payer: Self-pay | Admitting: *Deleted

## 2014-09-29 DIAGNOSIS — G43109 Migraine with aura, not intractable, without status migrainosus: Secondary | ICD-10-CM

## 2014-09-29 NOTE — Telephone Encounter (Signed)
I sent this to you previously as a refill request.  I have changed it over to a telephone order.  Do you want to refill her imitrex solution for injection?  This has not been filled by our office since September 2014, so we need you to approve or reject the refill request.

## 2014-09-30 MED ORDER — SUMATRIPTAN SUCCINATE 6 MG/0.5ML ~~LOC~~ SOLN: 6.0000 mg | Freq: Every day | SUBCUTANEOUS | Status: DC | PRN

## 2014-10-03 ENCOUNTER — Other Ambulatory Visit: Payer: Self-pay | Admitting: Physical Medicine & Rehabilitation

## 2014-10-16 ENCOUNTER — Encounter: Payer: Self-pay | Admitting: Physical Medicine & Rehabilitation

## 2014-10-16 ENCOUNTER — Encounter
Payer: Managed Care, Other (non HMO) | Attending: Physical Medicine & Rehabilitation | Admitting: Physical Medicine & Rehabilitation

## 2014-10-16 VITALS — BP 128/84 | HR 69

## 2014-10-16 DIAGNOSIS — M170 Bilateral primary osteoarthritis of knee: Secondary | ICD-10-CM | POA: Diagnosis not present

## 2014-10-16 DIAGNOSIS — M797 Fibromyalgia: Secondary | ICD-10-CM | POA: Diagnosis not present

## 2014-10-16 DIAGNOSIS — M26609 Unspecified temporomandibular joint disorder, unspecified side: Secondary | ICD-10-CM

## 2014-10-16 DIAGNOSIS — M47816 Spondylosis without myelopathy or radiculopathy, lumbar region: Secondary | ICD-10-CM

## 2014-10-16 DIAGNOSIS — Z5181 Encounter for therapeutic drug level monitoring: Secondary | ICD-10-CM

## 2014-10-16 DIAGNOSIS — G43109 Migraine with aura, not intractable, without status migrainosus: Secondary | ICD-10-CM

## 2014-10-16 DIAGNOSIS — M2660 Temporomandibular joint disorder, unspecified: Secondary | ICD-10-CM | POA: Diagnosis not present

## 2014-10-16 DIAGNOSIS — Z79899 Other long term (current) drug therapy: Secondary | ICD-10-CM | POA: Diagnosis present

## 2014-10-16 DIAGNOSIS — M1712 Unilateral primary osteoarthritis, left knee: Secondary | ICD-10-CM

## 2014-10-16 MED ORDER — HYDROMORPHONE HCL 4 MG PO TABS: 4.0000 mg | ORAL_TABLET | ORAL | Status: DC | PRN

## 2014-10-16 MED ORDER — OXYCODONE HCL 5 MG PO TABA: 1.0000 | ORAL_TABLET | Freq: Three times a day (TID) | ORAL | Status: DC | PRN

## 2014-10-16 MED ORDER — TOPIRAMATE 100 MG PO TABS: ORAL_TABLET | ORAL | Status: DC

## 2014-10-16 NOTE — Progress Notes (Signed)
Subjective:    Patient ID: Shannon Meadows, female    DOB: [birth year], 44 y.o.   MRN: [id number]  HPI   Shannon Meadows is here in follow up of her chronic pain. She hasn't moved from [place] yet as she's looking at job opps. Her father continues to live her.   She sees ortho for her knees. She had synvisc over the winter which helped slightly. They are in a "holding pattern" of sorts.    Wants trigger points in her neck which continue to provide assistance.  Pain Inventory Average Pain 6 Pain Right Now 6 My pain is intermittent, constant, sharp, burning, dull, stabbing, tingling and aching  In the last 24 hours, has pain interfered with the following? General activity 5 Relation with others 5 Enjoyment of life 5 What TIME of day is your pain at its worst? morning Sleep (in general) Fair  Pain is worse with: unsure and some activites Pain improves with: rest, heat/ice, therapy/exercise, pacing activities, medication and injections Relief from Meds: 5  Mobility ability to climb steps?  yes do you drive?  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  no  Physicians involved in your care Any changes since last visit?  no   Family History  Problem Relation Age of Onset  . Heart disease    . Lung disease    . Diabetes    . Hypertension    . Hyperlipidemia Father   . Hypertension Father   . Arthritis Father   . Heart disease Father   . Asthma Mother   . Diabetes Mother   . Arthritis Mother   . Heart disease Mother    Social History   Social History  . Marital Status: Single    Spouse Name: N/A  . Number of Children: N/A  . Years of Education: N/A   Social History Main Topics  . Smoking status: Never Smoker   . Smokeless tobacco: Never Used  . Alcohol Use: None  . Drug Use: None  . Sexual Activity: Not Asked   Other Topics Concern  . None   Social History  Narrative   Past Surgical History  Procedure Laterality Date  . Ulnar collateral ligament reconstruction    . Laporoscopic surgery      UTEREINE  . Knee arthroscopy     Past Medical History  Diagnosis Date  . Chronic headaches   . TMJ syndrome   . Back pain   . Lumbar sprain   . Migraines   . Myofascial pain   . Chronic pain syndrome   . Neck pain   . Endometriosis   . Cervicalgia   . Facet syndrome, lumbar   . Hypertension    BP 128/84 mmHg  Pulse 69  SpO2 99%  Opioid Risk Score:   Fall Risk Score:  `1  Depression screen PHQ 2/9  Depression screen PHQ 2/9 04/10/2014  Decreased Interest 0  Down, Depressed, Hopeless 1  PHQ - 2 Score 1  Altered sleeping 2  Tired, decreased energy 2  Change in appetite 1  Feeling bad or failure about yourself  0  Trouble concentrating 0  Moving slowly or fidgety/restless 0  Suicidal thoughts 0  PHQ-9 Score 6     Review of Systems  All other systems reviewed and are negative.      Objective:   Physical Exam  Constitutional: She is oriented to person, place, and time. She appears well-developed and well-nourished.  Morbidly obese  HENT: dentition remains poor  Head: Normocephalic and atraumatic.  Eyes: EOM are normal. Pupils are equal, round, and reactive to light.  Cardiovascular: Normal rate.  Pulmonary/Chest: Effort normal and breath sounds normal.  Abdominal: Soft.  Musculoskeletal: head forward position Cervical back: She exhibits decreased range of motion, tenderness, bony tenderness and spasm, particularly at the upper [place]'s. Lumbar back: She exhibits decreased range of motion, tenderness, bony tenderness and pain. Continued Tp's are seen in the bilateral mid traps and Upper spenius capitis (2) muscles  Back:low back generally tender.decreased flexion/extension today. Substantial antalgia left leg.   Neurological: She is alert and oriented to person, place, and time. Gait is improved. Less antalgia seen.  Psych:  mood is good. Pleasant.   Assessment & Plan:   ASSESSMENT:  1. History of chronic migraines with aura and cervicalgia with shoulder girdle/cervical myofascial pain.  2. Temporomandibular joint dysfunction.  3. 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  4. Osteoarthritis of the left knee greater than right with meniscal. Chondromalacia patellae.  5. Plantar fibroids.    PLAN:  1. After informed consent , i again injected bilateral mid traps (2) and splenius capitis (2) each with 2cc of 1% lidocaine--for a total of 4 TPI's . Pt experienced relief again before leaving the office. Post-injection instructions were provided. No complications were experienced. Informed consent was obtained. 2. Knee injections/plan per ortho.  3. I refilled her Dilaudid 4 mg #12 and oxycodone 5 mg #90 today. She was given prescriptions for all these medications for an additional month. I told Shannon Meadows that if her medications were lost again, that I would not replace them early. She understands her obligation to be responsible about her meds.  4. Continue topamax to 100mg  qhs for migraine prophylaxis ---3 month rx was provided.  5. I will see her back here in about 2 months.15 minutes of face to face patient care time were spent during this visit. All questions were encouraged and answered.

## 2014-10-16 NOTE — Patient Instructions (Signed)
PLEASE CALL ME WITH ANY PROBLEMS OR QUESTIONS (#[phone]).  HAVE A GOOD DAY!    

## 2014-12-01 ENCOUNTER — Other Ambulatory Visit: Payer: Self-pay | Admitting: Physical Medicine & Rehabilitation

## 2014-12-01 ENCOUNTER — Encounter
Payer: Managed Care, Other (non HMO) | Attending: Physical Medicine & Rehabilitation | Admitting: Physical Medicine & Rehabilitation

## 2014-12-01 ENCOUNTER — Encounter: Payer: Self-pay | Admitting: Physical Medicine & Rehabilitation

## 2014-12-01 VITALS — BP 125/47 | HR 81

## 2014-12-01 DIAGNOSIS — G43109 Migraine with aura, not intractable, without status migrainosus: Secondary | ICD-10-CM

## 2014-12-01 DIAGNOSIS — M26609 Unspecified temporomandibular joint disorder, unspecified side: Secondary | ICD-10-CM

## 2014-12-01 DIAGNOSIS — G894 Chronic pain syndrome: Secondary | ICD-10-CM

## 2014-12-01 DIAGNOSIS — M1711 Unilateral primary osteoarthritis, right knee: Secondary | ICD-10-CM

## 2014-12-01 DIAGNOSIS — M47816 Spondylosis without myelopathy or radiculopathy, lumbar region: Secondary | ICD-10-CM

## 2014-12-01 DIAGNOSIS — M797 Fibromyalgia: Secondary | ICD-10-CM | POA: Diagnosis not present

## 2014-12-01 DIAGNOSIS — Z5181 Encounter for therapeutic drug level monitoring: Secondary | ICD-10-CM

## 2014-12-01 DIAGNOSIS — M170 Bilateral primary osteoarthritis of knee: Secondary | ICD-10-CM | POA: Insufficient documentation

## 2014-12-01 DIAGNOSIS — Z79899 Other long term (current) drug therapy: Secondary | ICD-10-CM | POA: Diagnosis present

## 2014-12-01 DIAGNOSIS — M1712 Unilateral primary osteoarthritis, left knee: Secondary | ICD-10-CM

## 2014-12-01 MED ORDER — OXYCODONE HCL 5 MG PO TABA: 1.0000 | ORAL_TABLET | Freq: Three times a day (TID) | ORAL | Status: DC | PRN

## 2014-12-01 MED ORDER — HYDROMORPHONE HCL 4 MG PO TABS
4.0000 mg | ORAL_TABLET | ORAL | Status: DC | PRN
Start: 2014-12-01 — End: 2015-01-26

## 2014-12-01 MED ORDER — HYDROMORPHONE HCL 4 MG PO TABS: 4.0000 mg | ORAL_TABLET | ORAL | Status: DC | PRN

## 2014-12-01 MED ORDER — TIZANIDINE HCL 2 MG PO TABS: 2.0000 mg | ORAL_TABLET | Freq: Three times a day (TID) | ORAL | Status: DC | PRN

## 2014-12-01 NOTE — Progress Notes (Signed)
Subjective:    Patient ID: Shannon Meadows, female    DOB: 11-Jan-1971, 44 y.o.   MRN: [id number]  HPI   Shannon Meadows is here in follow up of her chronic pain. Her back started to act up this Fall, but over the last few weeks her right low back pain really increased with associated spasm. She denies pain in her legs but has had some pain in her right "SI joint " area. She saw orthopedics locally and had an MRI perforemd. She brought a copy with her today. She recalls two areas with "bulging" in the L1-2, L2-3 areas apparently. She brought the disc with her but I don't have a disc drive on my computer!!  She continues to struggle with her knees. The left knee tends to be the problem side, but now she has had more pain on the right side.   Her neck pain remains unchanged. tpi's help with those typically.   She is interviewing in [facility] next week but is still hoping for a spot in [place].     Pain Inventory Average Pain 4 Pain Right Now 6 My pain is intermittent, constant, sharp, burning, dull, stabbing, tingling and aching  In the last 24 hours, has pain interfered with the following? General activity 4 Relation with others 4 Enjoyment of life 4 What TIME of day is your pain at its worst? daytime Sleep (in general) Fair  Pain is worse with: inactivity and some activites Pain improves with: rest, heat/ice, pacing activities, medication, TENS and injections Relief from Meds: 4  Mobility walk without assistance ability to climb steps?  yes do you drive?  yes Do you have any goals in this area?  yes  Function employed # of hrs/week 48 Do you have any goals in this area?  yes  Neuro/Psych numbness spasms dizziness  Prior Studies Any changes since last visit?  yes  Physicians involved in your care Any changes since last visit?  no   Family History  Problem Relation Age of Onset  . Heart disease    . Lung disease    . Diabetes    . Hypertension    . Hyperlipidemia Father   .  Hypertension Father   . Arthritis Father   . Heart disease Father   . Asthma Mother   . Diabetes Mother   . Arthritis Mother   . Heart disease Mother    Social History   Social History  . Marital Status: Single    Spouse Name: N/A  . Number of Children: N/A  . Years of Education: N/A   Social History Main Topics  . Smoking status: Never Smoker   . Smokeless tobacco: Never Used  . Alcohol Use: None  . Drug Use: None  . Sexual Activity: Not Asked   Other Topics Concern  . None   Social History Narrative   Past Surgical History  Procedure Laterality Date  . Ulnar collateral ligament reconstruction    . Laporoscopic surgery      UTEREINE  . Knee arthroscopy     Past Medical History  Diagnosis Date  . Chronic headaches   . TMJ syndrome   . Back pain   . Lumbar sprain   . Migraines   . Myofascial pain   . Chronic pain syndrome   . Neck pain   . Endometriosis   . Cervicalgia   . Facet syndrome, lumbar   . Hypertension    BP 125/47 mmHg  Pulse 81  SpO2 100%  Opioid Risk Score:   Fall Risk Score:  `1  Depression screen PHQ 2/9  Depression screen PHQ 2/9 04/10/2014  Decreased Interest 0  Down, Depressed, Hopeless 1  PHQ - 2 Score 1  Altered sleeping 2  Tired, decreased energy 2  Change in appetite 1  Feeling bad or failure about yourself  0  Trouble concentrating 0  Moving slowly or fidgety/restless 0  Suicidal thoughts 0  PHQ-9 Score 6     Review of Systems  Constitutional: Positive for appetite change and unexpected weight change.  Respiratory: Positive for cough.   Gastrointestinal: Positive for nausea and vomiting.  Musculoskeletal:       Spasms  Neurological: Positive for dizziness and numbness.  All other systems reviewed and are negative.      Objective:   Physical Exam   Constitutional: She is oriented to person, place, and time. She appears well-developed and well-nourished.  Morbidly obese  HENT: dentition remains poor  Head:  Normocephalic and atraumatic.  Eyes: EOM are normal. Pupils are equal, round, and reactive to light.  Cardiovascular: Normal rate.  Pulmonary/Chest: Effort normal and breath sounds normal.  Abdominal: Soft.  Musculoskeletal: head forward position Cervical back: She exhibits decreased range of motion, tenderness, bony tenderness and spasm, particularly at the upper [place]. Lumbar back: She exhibits decreased range of motion, tenderness, bony tenderness a(2) muscles  Back:low back generally tender, she has noticeable spasm along the right.decreased flexion/extension today. Substantial antalgia left leg with weight bearing but also antaglic on right. She tends to use her back to assist with swing of the right leg. Marland Kitchen   Neurological: She is alert and oriented to person, place, and time. Gait is improved. Less antalgia seen.  Psych: mood is good. Pleasant.    Assessment & Plan:   ASSESSMENT:  1. History of chronic migraines with aura and cervicalgia with shoulder girdle/cervical myofascial pain.  2. Temporomandibular joint dysfunction.  3. 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 Back symptoms worse because of altered gait mechanics. Recent MRI performed 4. Osteoarthritis of the left knee greater than right with meniscal. Right knee flaring however.  Chondromalacia patellae.  5. Plantar fibroids.    PLAN:  1. After informed consent , i again injected bilateral mid traps (2) and splenius capitis (1), right lumbar paraspinals each with 2cc of 1% lidocaine--for a total of 4 TPI's . Pt experienced relief again before leaving the office. Post-injection instructions were provided. No complications were experienced. Informed consent was obtained.  2. After informed consent and preparation of the skin with betadine and isopropyl alcohol, I injected 6mg  (1cc) of celestone and 4cc of 1% lidocaine into the right knee via anteroloateral approach. Additionally, aspiration was performed prior  to injection. The patient tolerated well, and no complications were encountered. Afterward the area was cleaned and dressed. Post- injection instructions were provided.  .  3. I refilled her Dilaudid 4 mg #12 and oxycodone 5 mg #90 today. She was given prescriptions for all these medications for an additional month. I told Doneen that if her medications were lost again, that I would not replace them early. She understands her obligation to be responsible about her meds.  4. Continue topamax to 100mg  qhs for migraine prophylaxis ---3 month rx was provided. 5. Will review MRI and we can consider potential injections based on findings. I suspect that her gait has a lot do with her back pain given her chronic knees issues.  6. Will add robaxin for muscles spasms. Will need to stretch an dwork on gait mechanics also. 7. I will see her back here in about 2 months, pending review of the MRI.  15 minutes of face to face patient care time were spent during this visit. All questions were encouraged and answered.

## 2014-12-01 NOTE — Patient Instructions (Signed)
PLEASE CALL ME WITH ANY PROBLEMS OR QUESTIONS (#[phone]).  HAVE A GOOD DAY!    

## 2014-12-02 LAB — PMP ALCOHOL METABOLITE (ETG)

## 2014-12-05 LAB — PRESCRIPTION MONITORING PROFILE (SOLSTAS)
Amphetamine/Meth: NEGATIVE ng/mL
BARBITURATE SCREEN, URINE: NEGATIVE ng/mL
Benzodiazepine Screen, Urine: NEGATIVE ng/mL
Buprenorphine, Urine: NEGATIVE ng/mL
CANNABINOID SCRN UR: NEGATIVE ng/mL
COCAINE METABOLITES: NEGATIVE ng/mL
CREATININE, URINE: 160.99 mg/dL (ref >20.0)
Carisoprodol, Urine: NEGATIVE ng/mL
ECSTASY: NEGATIVE ng/mL
FENTANYL URINE: NEGATIVE ng/mL
MEPERIDINE UR: NEGATIVE ng/mL
METHADONE SCREEN, URINE: NEGATIVE ng/mL
Nitrites, Initial: NEGATIVE ug/mL
PH URINE, INITIAL: 5.1 pH (ref 4.5–8.9)
Propoxyphene: NEGATIVE ng/mL
TAPENTADOLUR: NEGATIVE ng/mL
Tramadol Scrn, Ur: NEGATIVE ng/mL
Zolpidem, Urine: NEGATIVE ng/mL

## 2014-12-05 LAB — OXYCODONE, URINE (LC/MS-MS)
Noroxycodone, Ur: 6159 ng/mL (ref <50)
OXYMORPHONE, URINE: 1401 ng/mL (ref <50)
Oxycodone, ur: 1403 ng/mL (ref <50)

## 2014-12-05 LAB — ETHYL GLUCURONIDE, URINE
ETGU: 5833 ng/mL — AB (ref <500)
ETHYL SULFATE (ETS): 1007 ng/mL — AB (ref <100)

## 2014-12-05 LAB — OPIATES/OPIOIDS (LC/MS-MS)
CODEINE URINE: NEGATIVE ng/mL (ref <50)
HYDROMORPHONE: 953 ng/mL (ref <50)
Hydrocodone: NEGATIVE ng/mL (ref <50)
MORPHINE: NEGATIVE ng/mL (ref <50)
Norhydrocodone, Ur: NEGATIVE ng/mL (ref <50)
Noroxycodone, Ur: 6159 ng/mL (ref <50)
OXYCODONE, UR: 1403 ng/mL (ref <50)
Oxymorphone: 1401 ng/mL (ref <50)

## 2014-12-10 NOTE — Progress Notes (Signed)
Urine drug screen for this encounter is consistent for prescribed medication 

## 2014-12-31 ENCOUNTER — Other Ambulatory Visit: Payer: Self-pay | Admitting: Physical Medicine & Rehabilitation

## 2015-01-16 ENCOUNTER — Other Ambulatory Visit: Payer: Self-pay | Admitting: Physical Medicine & Rehabilitation

## 2015-01-22 ENCOUNTER — Telehealth: Payer: Self-pay | Admitting: *Deleted

## 2015-01-22 DIAGNOSIS — G43109 Migraine with aura, not intractable, without status migrainosus: Secondary | ICD-10-CM

## 2015-01-22 DIAGNOSIS — M26609 Unspecified temporomandibular joint disorder, unspecified side: Secondary | ICD-10-CM

## 2015-01-22 DIAGNOSIS — M797 Fibromyalgia: Secondary | ICD-10-CM

## 2015-01-22 DIAGNOSIS — M1712 Unilateral primary osteoarthritis, left knee: Secondary | ICD-10-CM

## 2015-01-22 DIAGNOSIS — M47816 Spondylosis without myelopathy or radiculopathy, lumbar region: Secondary | ICD-10-CM

## 2015-01-22 DIAGNOSIS — Z79899 Other long term (current) drug therapy: Secondary | ICD-10-CM

## 2015-01-22 DIAGNOSIS — Z5181 Encounter for therapeutic drug level monitoring: Secondary | ICD-10-CM

## 2015-01-22 NOTE — Telephone Encounter (Signed)
Shannon Meadows called and said that she has had to reschedule next weeks appt to February and will need to pick up her prescriptions (a friend). She is requesting a call back.

## 2015-01-26 MED ORDER — HYDROMORPHONE HCL 4 MG PO TABS: 4.0000 mg | ORAL_TABLET | ORAL | Status: DC | PRN

## 2015-01-26 MED ORDER — OXYCODONE HCL 5 MG PO TABA: 1.0000 | ORAL_TABLET | Freq: Three times a day (TID) | ORAL | Status: DC | PRN

## 2015-01-26 NOTE — Telephone Encounter (Signed)
Okay to refill meds until next appointment?

## 2015-01-26 NOTE — Telephone Encounter (Signed)
i would be willing to let her pick them up.

## 2015-01-26 NOTE — Telephone Encounter (Signed)
Left voicemail for Shannon Meadows to call back and leave the name of the friend who will be picking up  RX which will be available tomorrow.

## 2015-01-26 NOTE — Telephone Encounter (Signed)
Rx printed for Shannon Meadows to sign.

## 2015-01-26 NOTE — Telephone Encounter (Signed)
Pt's friends names to pick up rx are Lawrence Santiago and [employer].

## 2015-01-27 ENCOUNTER — Encounter: Payer: Managed Care, Other (non HMO) | Admitting: Physical Medicine & Rehabilitation

## 2015-01-27 NOTE — Telephone Encounter (Signed)
Rx printed, signed, and placed in book up front for pick up

## 2015-02-24 ENCOUNTER — Encounter: Payer: Self-pay | Admitting: Physical Medicine & Rehabilitation

## 2015-02-24 ENCOUNTER — Encounter
Payer: Managed Care, Other (non HMO) | Attending: Physical Medicine & Rehabilitation | Admitting: Physical Medicine & Rehabilitation

## 2015-02-24 DIAGNOSIS — M797 Fibromyalgia: Secondary | ICD-10-CM | POA: Diagnosis present

## 2015-02-24 DIAGNOSIS — M47816 Spondylosis without myelopathy or radiculopathy, lumbar region: Secondary | ICD-10-CM | POA: Diagnosis not present

## 2015-02-24 DIAGNOSIS — M26609 Unspecified temporomandibular joint disorder, unspecified side: Secondary | ICD-10-CM

## 2015-02-24 DIAGNOSIS — Z5181 Encounter for therapeutic drug level monitoring: Secondary | ICD-10-CM | POA: Insufficient documentation

## 2015-02-24 DIAGNOSIS — M170 Bilateral primary osteoarthritis of knee: Secondary | ICD-10-CM

## 2015-02-24 DIAGNOSIS — G43109 Migraine with aura, not intractable, without status migrainosus: Secondary | ICD-10-CM | POA: Insufficient documentation

## 2015-02-24 DIAGNOSIS — M1712 Unilateral primary osteoarthritis, left knee: Secondary | ICD-10-CM | POA: Diagnosis not present

## 2015-02-24 DIAGNOSIS — Z79899 Other long term (current) drug therapy: Secondary | ICD-10-CM | POA: Diagnosis present

## 2015-02-24 DIAGNOSIS — M47896 Other spondylosis, lumbar region: Secondary | ICD-10-CM | POA: Diagnosis not present

## 2015-02-24 MED ORDER — OXYCODONE HCL 5 MG PO TABA: 1.0000 | ORAL_TABLET | Freq: Three times a day (TID) | ORAL | Status: DC | PRN

## 2015-02-24 MED ORDER — HYDROMORPHONE HCL 4 MG PO TABS: 4.0000 mg | ORAL_TABLET | ORAL | Status: DC | PRN

## 2015-02-24 NOTE — Progress Notes (Signed)
Subjective:    Patient ID: Shannon Meadows, female    DOB: 1970/07/04, 45 y.o.   MRN: [id number]  HPI  Shannon Meadows is here in follow up of her chronic pain. She has moved to [facility] and is working down in [place]. She is having ongoing neck/shoulder pain per her routine. Her left knee has been acting up recently with swelling and pain especially after she's worked. I injected her right knee on 11/8 and this has been working well for her since then.   She continues on her pain medications as presribed which seem to allow her to function.   Her migraine pattern is about the same. She is having several per week. She may be headache free about 1 or 2 days per week.   She is liking her new job as this physical demands don't seem to be quite as much. She is hoping that she will be there for a while.     Pain Inventory Average Pain 5 Pain Right Now 5 My pain is same  In the last 24 hours, has pain interfered with the following? General activity 4 Relation with others 5 Enjoyment of life 5 What TIME of day is your pain at its worst? daytime Sleep (in general) Fair  Pain is worse with: walking, bending, sitting, inactivity, standing and some activites Pain improves with: rest, heat/ice, pacing activities, medication, TENS and injections Relief from Meds: 4  Mobility walk without assistance ability to climb steps?  yes do you drive?  yes Do you have any goals in this area?  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  yes  Neuro/Psych No problems in this area  Prior Studies Any changes since last visit?  no  Physicians involved in your care Any changes since last visit?  no   Family History  Problem Relation Age of Onset  . Heart disease    . Lung disease    . Diabetes    . Hypertension    . Hyperlipidemia Father   . Hypertension Father   . Arthritis Father   . Heart disease Father   . Asthma Mother   . Diabetes  Mother   . Arthritis Mother   . Heart disease Mother    Social History   Social History  . Marital Status: Single    Spouse Name: N/A  . Number of Children: N/A  . Years of Education: N/A   Social History Main Topics  . Smoking status: Never Smoker   . Smokeless tobacco: Never Used  . Alcohol Use: None  . Drug Use: None  . Sexual Activity: Not Asked   Other Topics Concern  . None   Social History Narrative   Past Surgical History  Procedure Laterality Date  . Ulnar collateral ligament reconstruction    . Laporoscopic surgery      UTEREINE  . Knee arthroscopy     Past Medical History  Diagnosis Date  . Chronic headaches   . TMJ syndrome   . Back pain   . Lumbar sprain   . Migraines   . Myofascial pain   . Chronic pain syndrome   . Neck pain   . Endometriosis   . Cervicalgia   . Facet syndrome, lumbar   . Hypertension    There were no vitals taken for this visit.  Opioid Risk Score:   Fall Risk Score:  `1  Depression screen  PHQ 2/9  Depression screen PHQ 2/9 04/10/2014  Decreased Interest 0  Down, Depressed, Hopeless 1  PHQ - 2 Score 1  Altered sleeping 2  Tired, decreased energy 2  Change in appetite 1  Feeling bad or failure about yourself  0  Trouble concentrating 0  Moving slowly or fidgety/restless 0  Suicidal thoughts 0  PHQ-9 Score 6     Review of Systems  Gastrointestinal: Positive for nausea.  All other systems reviewed and are negative.      Objective:   Physical Exam Morbidly obese  HENT: dentition remains poor  Head: Normocephalic and atraumatic.  Eyes: EOM are normal. Pupils are equal, round, and reactive to light.  Cardiovascular: Normal rate.  Pulmonary/Chest: Effort normal and breath sounds normal.  Abdominal: Soft.  Musculoskeletal: head forward position Cervical back: She exhibits decreased range of motion, tenderness, bony tenderness and spasm, particularly at the upper [place]'s. Lumbar back: She exhibits decreased range  of motion, tenderness, bony tenderness a(2) muscles  Back:low back generally tender, she has noticeable spasm along the right.decreased flexion/extension today. Substantial antalgia left leg with weight bearing but also antaglic on right. She tends to use her back to assist with swing of the right leg. Marland Kitchen   Neurological: She is alert and oriented to person, place, and time. Gait is improved. Less antalgia seen.  Psych: mood is good. Pleasant.  Assessment & Plan:   ASSESSMENT:  1. History of chronic migraines with aura and cervicalgia with shoulder girdle/cervical myofascial pain.  2. Temporomandibular joint dysfunction.  3. 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 Back symptoms worse because of altered gait mechanics. Recent MRI performed  4. Osteoarthritis of the left knee greater than right with meniscal. Right knee flaring however. Chondromalacia patellae.  5. Plantar fibroids.   PLAN:  1. After informed consent , i again injected bilateral mid traps (2) and splenius capitis (2), each with 2cc of 1% lidocaine--for a total of 4 TPI's . Pt experienced relief again before leaving the office. Post-injection instructions were provided. No complications were experienced. Informed consent was obtained.  2. After informed consent and preparation of the skin with betadine and isopropyl alcohol, I injected 6mg  (1cc) of celestone and 4cc of 1% lidocaine into the left knee via anteroloateral approach. Additionally, aspiration was performed prior to injection. The patient tolerated well, and no complications were encountered. Afterward the area was cleaned and dressed. Post- injection instructions were provided.  .  3. I refilled her Dilaudid 4 mg #12 and oxycodone 5 mg #90 today. She was given prescriptions for all these medications for an additional month. I told Shannon Meadows that if her medications were lost again, that I would not replace them early. She understands her obligation to be  responsible about her meds.  4. Continue topamax to 100mg  qhs for migraine prophylaxis ---3 month rx was provided.  -botox might be a good solution for her especially given her history of myofascial pain  -she will look into insurance coverage  5. Will review MRI and we can consider potential injections based on findings. I suspect that her gait has a lot do with her back pain given her chronic knees issues.  6. Will add robaxin for muscles spasms. Will need to stretch an dwork on gait mechanics also.  7. I will see her back here in about 2 months.    15 minutes of face to face patient care time were spent during this visit. All questions were  encouraged and answered.

## 2015-04-19 ENCOUNTER — Encounter
Payer: BLUE CROSS/BLUE SHIELD | Attending: Physical Medicine & Rehabilitation | Admitting: Physical Medicine & Rehabilitation

## 2015-04-19 ENCOUNTER — Encounter: Payer: Self-pay | Admitting: Physical Medicine & Rehabilitation

## 2015-04-19 VITALS — BP 146/81 | HR 88

## 2015-04-19 DIAGNOSIS — Z79899 Other long term (current) drug therapy: Secondary | ICD-10-CM | POA: Diagnosis present

## 2015-04-19 DIAGNOSIS — M797 Fibromyalgia: Secondary | ICD-10-CM

## 2015-04-19 DIAGNOSIS — M26609 Unspecified temporomandibular joint disorder, unspecified side: Secondary | ICD-10-CM | POA: Diagnosis present

## 2015-04-19 DIAGNOSIS — G43109 Migraine with aura, not intractable, without status migrainosus: Secondary | ICD-10-CM | POA: Diagnosis present

## 2015-04-19 DIAGNOSIS — Z5181 Encounter for therapeutic drug level monitoring: Secondary | ICD-10-CM

## 2015-04-19 DIAGNOSIS — G894 Chronic pain syndrome: Secondary | ICD-10-CM

## 2015-04-19 DIAGNOSIS — M170 Bilateral primary osteoarthritis of knee: Secondary | ICD-10-CM | POA: Insufficient documentation

## 2015-04-19 DIAGNOSIS — M47816 Spondylosis without myelopathy or radiculopathy, lumbar region: Secondary | ICD-10-CM

## 2015-04-19 DIAGNOSIS — M1712 Unilateral primary osteoarthritis, left knee: Secondary | ICD-10-CM

## 2015-04-19 MED ORDER — TIZANIDINE HCL 2 MG PO TABS
2.0000 mg | ORAL_TABLET | Freq: Three times a day (TID) | ORAL | Status: DC | PRN
Start: 2015-04-19 — End: 2015-09-30

## 2015-04-19 MED ORDER — TOPIRAMATE 100 MG PO TABS: ORAL_TABLET | ORAL | Status: DC

## 2015-04-19 MED ORDER — HYDROMORPHONE HCL 4 MG PO TABS: 4.0000 mg | ORAL_TABLET | ORAL | Status: DC | PRN

## 2015-04-19 MED ORDER — OXYCODONE HCL 5 MG PO TABA: 1.0000 | ORAL_TABLET | Freq: Three times a day (TID) | ORAL | Status: DC | PRN

## 2015-04-19 NOTE — Patient Instructions (Signed)
YOU NEED ORTHOPEDIC FOLLOW UP FOR YOUR LEFT KNEE.  YOU PROBABLY NEED A NEW MRI ALSO

## 2015-04-19 NOTE — Progress Notes (Signed)
Subjective:    Patient ID: Shannon Meadows, female    DOB: 1970-05-03, 45 y.o.   MRN: [id number]  HPI  Has moved to [facility].  She fell after I injected the left knee when she passed out walking in the grocery store. She doesn't know why. She fell on her left knee apparently. She hasn't had any symptoms or near syncopal episodes since then.  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 She takes her medications as prescribed.     Pain Inventory Average Pain 8 Pain Right Now 6 My pain is constant  In the last 24 hours, has pain interfered with the following? General activity 8 Relation with others 5 Enjoyment of life 5 What TIME of day is your pain at its worst? daytime Sleep (in general) Fair  Pain is worse with: walking, bending, sitting, inactivity, standing and some activites Pain improves with: rest, heat/ice, pacing activities, medication and injections Relief from Meds: 7  Mobility walk without assistance ability to climb steps?  yes do you drive?  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  no  Physicians involved in your care Any changes since last visit?  no   Family History  Problem Relation Age of Onset  . Heart disease    . Lung disease    . Diabetes    . Hypertension    . Hyperlipidemia Father   . Hypertension Father   . Arthritis Father   . Heart disease Father   . Asthma Mother   . Diabetes Mother   . Arthritis Mother   . Heart disease Mother    Social History   Social History  . Marital Status: Single    Spouse Name: N/A  . Number of Children: N/A  . Years of Education: N/A   Social History Main Topics  . Smoking status: Never Smoker   . Smokeless tobacco: Never Used  . Alcohol Use: None  . Drug Use: None  . Sexual Activity: Not Asked   Other Topics Concern  . None    Social History Narrative   Past Surgical History  Procedure Laterality Date  . Ulnar collateral ligament reconstruction    . Laporoscopic surgery      UTEREINE  . Knee arthroscopy     Past Medical History  Diagnosis Date  . Chronic headaches   . TMJ syndrome   . Back pain   . Lumbar sprain   . Migraines   . Myofascial pain   . Chronic pain syndrome   . Neck pain   . Endometriosis   . Cervicalgia   . Facet syndrome, lumbar   . Hypertension    BP 146/81 mmHg  Pulse 88  SpO2 97%  Opioid Risk Score:   Fall Risk Score:  `1  Depression screen PHQ 2/9  Depression screen [facility] 2/9 04/19/2015 04/10/2014  Decreased Interest 0 0  Down, Depressed, Hopeless 1 1  PHQ - 2 Score 1 1  Altered sleeping - 2  Tired, decreased energy - 2  Change in appetite - 1  Feeling bad or failure about yourself  - 0  Trouble concentrating - 0  Moving slowly or fidgety/restless - 0  Suicidal thoughts - 0  PHQ-9 Score - 6    Review of Systems  All other systems reviewed and are negative.  Objective:   Physical Exam  Morbidly obese  HENT: dentition remains poor  Head: Normocephalic and atraumatic.  Eyes: EOM are normal. Pupils are equal, round, and reactive to light.  Cardiovascular: Normal rate.  Pulmonary/Chest: Effort normal and breath sounds normal.  Abdominal: Soft.  Musculoskeletal: head forward position Cervical back: She exhibits decreased range of motion, tenderness, bony tenderness and spasm, particularly at the upper 's. Lumbar back: She exhibits decreased range of motion, tenderness, bony tenderness a(2) muscles  Back:low back generally tender, she has noticeable spasm along the right.decreased flexion/extension today. Substantial antalgia left leg with weight bearing but also antaglic on right. She tends to use her back to assist with swing of the right leg. Marland Kitchen   Neurological: She is alert and oriented to person, place, and time. Gait is improved. Less antalgia seen.   Psych: mood is good. Pleasant.   Assessment & Plan:   ASSESSMENT:  1. History of chronic migraines with aura and cervicalgia with shoulder girdle/cervical myofascial pain.  2. Temporomandibular joint dysfunction.  3. 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 Back symptoms worse because of altered gait mechanics. Recent MRI performed  4. Osteoarthritis of the left knee greater than right with meniscal injury.  Chondromalacia patellae.  5. Plantar fibroids.   PLAN:  1. Needs to see an orthopedic surgeon locally regarding her right knee and meniscal injury.   2. After informed consent and preparation of the skin with isopropyl alcohol, I injected the bilateral traps (x 4 total, two on each side) with 2cc of 1% lidocaine. The patient tolerated well, and no complications were experienced. Post-injection instructions were provided.    3. I refilled her Dilaudid 4 mg #12 and oxycodone 5 mg #90 today. She was given prescriptions for all these medications for an additional month.   4. Continue topamax to 100mg  qhs for migraine prophylaxis ---3 month rx was provided.  -botox may be a good solution for her especially given her history of myofascial pain  -she will look into insurance coverage  6. Will add robaxin for muscles spasms. Will need to stretch an dwork on gait mechanics also.  7. I will see her back here in about 2 months.  15 minutes of face to face patient care time were spent during this visit. All questions were encouraged and answered.

## 2015-04-22 LAB — TOXASSURE SELECT,+ANTIDEPR,UR: PDF: 0

## 2015-04-26 NOTE — Progress Notes (Signed)
Urine drug screen for this encounter is consistent for prescribed medication.  Last taken 3/24 & 3/25 and test done 04/19/15

## 2015-06-04 ENCOUNTER — Other Ambulatory Visit: Payer: Self-pay | Admitting: Physical Medicine & Rehabilitation

## 2015-06-14 ENCOUNTER — Encounter
Payer: BLUE CROSS/BLUE SHIELD | Attending: Physical Medicine & Rehabilitation | Admitting: Physical Medicine & Rehabilitation

## 2015-06-14 ENCOUNTER — Other Ambulatory Visit: Payer: Self-pay | Admitting: Physical Medicine & Rehabilitation

## 2015-06-14 ENCOUNTER — Encounter: Payer: Self-pay | Admitting: Physical Medicine & Rehabilitation

## 2015-06-14 VITALS — BP 148/78 | HR 94 | Resp 15

## 2015-06-14 DIAGNOSIS — Z5181 Encounter for therapeutic drug level monitoring: Secondary | ICD-10-CM | POA: Diagnosis present

## 2015-06-14 DIAGNOSIS — G43109 Migraine with aura, not intractable, without status migrainosus: Secondary | ICD-10-CM | POA: Diagnosis not present

## 2015-06-14 DIAGNOSIS — M26609 Unspecified temporomandibular joint disorder, unspecified side: Secondary | ICD-10-CM | POA: Insufficient documentation

## 2015-06-14 DIAGNOSIS — Z79899 Other long term (current) drug therapy: Secondary | ICD-10-CM | POA: Diagnosis present

## 2015-06-14 DIAGNOSIS — M797 Fibromyalgia: Secondary | ICD-10-CM | POA: Insufficient documentation

## 2015-06-14 DIAGNOSIS — M47816 Spondylosis without myelopathy or radiculopathy, lumbar region: Secondary | ICD-10-CM

## 2015-06-14 DIAGNOSIS — M170 Bilateral primary osteoarthritis of knee: Secondary | ICD-10-CM | POA: Diagnosis not present

## 2015-06-14 DIAGNOSIS — M1712 Unilateral primary osteoarthritis, left knee: Secondary | ICD-10-CM

## 2015-06-14 MED ORDER — HYDROMORPHONE HCL 4 MG PO TABS: 4.0000 mg | ORAL_TABLET | ORAL | Status: DC | PRN

## 2015-06-14 MED ORDER — OXYCODONE HCL 5 MG PO TABS: 5.0000 mg | ORAL_TABLET | Freq: Three times a day (TID) | ORAL | Status: DC | PRN

## 2015-06-14 NOTE — Progress Notes (Signed)
Subjective:    Patient ID: Shannon Meadows, female    DOB: 1970-12-24, 45 y.o.   MRN: [id number]  HPI  Shannon Meadows is here in follow up of her chronic pain . She is having more neck pain over the last couple months. Her knee pain is stable. She has an orthopedic referral which is pending.   Her TMJ has been "tolerable" for the most part. She just deals with it. Her headaches are persistent.  She has at least 3 headaches per week and >than 15 days per month.   She continues on her dilaudid for severe migraine pain and oxycodone for lesser breakthrough pain. These remain effective.   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 Shannon Meadows likes the [place] area. She has her father at home who has increasing care needs.     Pain Inventory Average Pain 6 Pain Right Now 6 My pain is intermittent, constant, sharp, burning, dull, stabbing and aching  In the last 24 hours, has pain interfered with the following? General activity 5 Relation with others 5 Enjoyment of life 5 What TIME of day is your pain at its worst? Morning and Daytime Sleep (in general) Fair  Pain is worse with: walking, bending, sitting, inactivity, standing and some activites Pain improves with: rest, heat/ice, pacing activities, medication, TENS and injections Relief from Meds: 7  Mobility walk without assistance ability to climb steps?  yes do you drive?  yes Do you have any goals in this area?  yes  Function employed # of hrs/week 36 what is your job? NA Do you have any goals in this area?  yes  Neuro/Psych No problems in this area  Prior Studies Any changes since last visit?  no  Physicians involved in your care Any changes since last visit?  no   Family History  Problem Relation Age of Onset  . Heart disease    . Lung disease    . Diabetes    . Hypertension    . Hyperlipidemia Father   . Hypertension Father   . Arthritis Father   . Heart disease Father   . Asthma Mother   . Diabetes  Mother   . Arthritis Mother   . Heart disease Mother    Social History   Social History  . Marital Status: Single    Spouse Name: N/A  . Number of Children: N/A  . Years of Education: N/A   Social History Main Topics  . Smoking status: Never Smoker   . Smokeless tobacco: Never Used  . Alcohol Use: None  . Drug Use: None  . Sexual Activity: Not Asked   Other Topics Concern  . None   Social History Narrative   Past Surgical History  Procedure Laterality Date  . Ulnar collateral ligament reconstruction    . Laporoscopic surgery      UTEREINE  . Knee arthroscopy     Past Medical History  Diagnosis Date  . Chronic headaches   . TMJ syndrome   . Back pain   . Lumbar sprain   . Migraines   . Myofascial pain   . Chronic pain syndrome   . Neck pain   . Endometriosis   . Cervicalgia   . Facet syndrome, lumbar   . Hypertension    BP 148/78 mmHg  Pulse 94  Resp 15  SpO2 99%  Opioid Risk Score:   Fall Risk Score:  `1  Depression screen PHQ 2/9  Depression screen  [facility] 2/9 04/19/2015 04/10/2014  Decreased Interest 0 0  Down, Depressed, Hopeless 1 1  PHQ - 2 Score 1 1  Altered sleeping - 2  Tired, decreased energy - 2  Change in appetite - 1  Feeling bad or failure about yourself  - 0  Trouble concentrating - 0  Moving slowly or fidgety/restless - 0  Suicidal thoughts - 0  PHQ-9 Score - 6      Review of Systems  All other systems reviewed and are negative.      Objective:   Physical Exam  Morbidly obese  HENT: dentition remains poor  Head: Normocephalic and atraumatic.  Eyes: EOM are normal. Pupils are equal, round, and reactive to light.  Cardiovascular: Normal rate.  Pulmonary/Chest: Effort normal and breath sounds normal.  Abdominal: Soft.  Musculoskeletal: head forward position Cervical back:  Tenderness in the semispinalis capitis and mid traps bilaterally. Rhomboids slightly tender also.  Lumbar back: She exhibits decreased range of motion,  tenderness, bony tenderness a(2) muscles  Back:low back generally tender, she has noticeable spasm along the right.decreased flexion/extension today. Substantial antalgia left leg with weight bearing but also antaglic on right. She tends to use her back to assist with swing of the right leg. Marland Kitchen   Neurological: She is alert and oriented to person, place, and time. Gait is improved. Less antalgia seen.  Psych: mood is good. Pleasant.   Assessment & Plan:   ASSESSMENT:  1. History of chronic migraines with aura and cervicalgia with shoulder girdle/cervical myofascial pain.  2. Temporomandibular joint dysfunction.  3. 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 Back symptoms worse because of altered gait mechanics. Recent MRI performed  4. Osteoarthritis of the left knee greater than right with meniscal injury. Chondromalacia patellae.  5. Plantar fibroids.    PLAN:  1. Needs to see an orthopedic surgeon locally regarding her right knee and meniscal injury.  2. After informed consent and preparation of the skin with isopropyl alcohol, I injected the bilateral traps and splenius capitis (x 4 total, two on each side) with 2cc of 1% lidocaine. The patient tolerated well, and no complications were experienced. Post-injection instructions were provided.  3. I refilled her Dilaudid 4 mg #12 and oxycodone 5 mg #90 today. She was given prescriptions for all these medications for an additional month.  4. Continue topamax to 100mg  qhs for migraine prophylaxis ---3 month rx was provided.  -botox may be a good solution for her especially given her history of myofascial pain  -will submit to insurance for botox migraine 200 u 6. Continue with HEP and realistic activities with work. 7. I will see her back here in about 2 months.    15 minutes of face to face patient care time were spent during this visit. All questions were encouraged and answered.

## 2015-06-14 NOTE — Telephone Encounter (Signed)
Patient requesting refill of Lyrica. Please advise.

## 2015-06-14 NOTE — Patient Instructions (Signed)
  PLEASE CALL ME WITH ANY PROBLEMS OR QUESTIONS (#[phone]).      

## 2015-08-04 ENCOUNTER — Telehealth: Payer: Self-pay | Admitting: Physical Medicine & Rehabilitation

## 2015-08-04 DIAGNOSIS — Z79899 Other long term (current) drug therapy: Secondary | ICD-10-CM

## 2015-08-04 DIAGNOSIS — M26609 Unspecified temporomandibular joint disorder, unspecified side: Secondary | ICD-10-CM

## 2015-08-04 DIAGNOSIS — G43109 Migraine with aura, not intractable, without status migrainosus: Secondary | ICD-10-CM

## 2015-08-04 DIAGNOSIS — M1712 Unilateral primary osteoarthritis, left knee: Secondary | ICD-10-CM

## 2015-08-04 DIAGNOSIS — M797 Fibromyalgia: Secondary | ICD-10-CM

## 2015-08-04 DIAGNOSIS — M47816 Spondylosis without myelopathy or radiculopathy, lumbar region: Secondary | ICD-10-CM

## 2015-08-04 DIAGNOSIS — M170 Bilateral primary osteoarthritis of knee: Secondary | ICD-10-CM

## 2015-08-04 DIAGNOSIS — Z5181 Encounter for therapeutic drug level monitoring: Secondary | ICD-10-CM

## 2015-08-04 MED ORDER — HYDROMORPHONE HCL 4 MG PO TABS
4.0000 mg | ORAL_TABLET | ORAL | Status: DC | PRN
Start: 2015-08-04 — End: 2015-09-13

## 2015-08-04 MED ORDER — OXYCODONE HCL 5 MG PO TABS: 5.0000 mg | ORAL_TABLET | Freq: Three times a day (TID) | ORAL | Status: DC | PRN

## 2015-08-04 NOTE — Telephone Encounter (Signed)
I called a verified fill date on last RX @ [facility].  They were filled 07/13/15.  Her return appt has been scheduled for 09/13/15 prescription for printed for Zella Ball to sign and Izora Gala notified.

## 2015-08-04 NOTE — Telephone Encounter (Signed)
Patient needs to get her oxycodone and dilaudid refilled, had to reschedule appointment next week due to issues with getting here. She will have to get Lawrence Santiago to come pick her medications up for her.  Please call patient when this is done.

## 2015-08-10 ENCOUNTER — Ambulatory Visit: Payer: BLUE CROSS/BLUE SHIELD | Admitting: Physical Medicine & Rehabilitation

## 2015-08-25 ENCOUNTER — Telehealth: Payer: Self-pay

## 2015-08-25 MED ORDER — PREGABALIN 300 MG PO CAPS: 300.0000 mg | ORAL_CAPSULE | Freq: Two times a day (BID) | ORAL | 5 refills | Status: DC

## 2015-08-25 NOTE — Telephone Encounter (Signed)
Lyrica called to pharmacy.

## 2015-08-25 NOTE — Telephone Encounter (Signed)
Pt is requesting a refill on Lyrica. I do not see any recent documentation regarding this medication. Is it okay to refill?

## 2015-08-25 NOTE — Telephone Encounter (Signed)
300mg  BID 5 RF  thanks

## 2015-09-13 ENCOUNTER — Encounter
Payer: BLUE CROSS/BLUE SHIELD | Attending: Physical Medicine & Rehabilitation | Admitting: Physical Medicine & Rehabilitation

## 2015-09-13 ENCOUNTER — Encounter: Payer: Self-pay | Admitting: Physical Medicine & Rehabilitation

## 2015-09-13 DIAGNOSIS — M26609 Unspecified temporomandibular joint disorder, unspecified side: Secondary | ICD-10-CM | POA: Diagnosis not present

## 2015-09-13 DIAGNOSIS — G43109 Migraine with aura, not intractable, without status migrainosus: Secondary | ICD-10-CM | POA: Diagnosis not present

## 2015-09-13 DIAGNOSIS — M47816 Spondylosis without myelopathy or radiculopathy, lumbar region: Secondary | ICD-10-CM | POA: Diagnosis not present

## 2015-09-13 DIAGNOSIS — M170 Bilateral primary osteoarthritis of knee: Secondary | ICD-10-CM | POA: Insufficient documentation

## 2015-09-13 DIAGNOSIS — Z79899 Other long term (current) drug therapy: Secondary | ICD-10-CM | POA: Insufficient documentation

## 2015-09-13 DIAGNOSIS — Z5181 Encounter for therapeutic drug level monitoring: Secondary | ICD-10-CM | POA: Diagnosis present

## 2015-09-13 DIAGNOSIS — M797 Fibromyalgia: Secondary | ICD-10-CM | POA: Diagnosis present

## 2015-09-13 DIAGNOSIS — M1712 Unilateral primary osteoarthritis, left knee: Secondary | ICD-10-CM

## 2015-09-13 MED ORDER — HYDROMORPHONE HCL 4 MG PO TABS: 4.0000 mg | ORAL_TABLET | ORAL | 0 refills | Status: DC | PRN

## 2015-09-13 MED ORDER — OXYCODONE HCL 5 MG PO TABS: 5.0000 mg | ORAL_TABLET | Freq: Three times a day (TID) | ORAL | 0 refills | Status: DC | PRN

## 2015-09-13 NOTE — Progress Notes (Signed)
Botox Injection for chronic migraine headaches ICD 10:  Migraine headaches  Dilution: 100 Units/ 56ml preservative free NS Indication: refractory headaches (At least 15 days per month/headache lasting greater than 4 hours per day) incompletely responsive to other more conservative measures.  Informed consent was obtained after describing risks and benefits of the procedure with the patient. This includes bleeding, bruising, infection, excessive weakness, or medication side effects. A REMS form is on file and signed. Needle: 30g 1/2 inch needle   Number of units per muscle:  Right temporalis 10 units, 2 access points Left temporalis 10 units,  2 access points Right frontalis 10 units, 2 access points Left frontalis 10 units, 2 access points Procerus 5 units, 1 access point Right corrugator 5 units, 1 access point Left corrugator 5 units, 1 access point Right occipitalis 15 units, 3 access points Left occipitalis 15 units, 3 access points Right cervical paraspinal 10 units, 2 access points Left cervical paraspinals 10 units, 2 access points  Right trapezius 15 units, 3 access points Left trapezius 15 units, 3 access points   Remaining 45 units was injected in the right and left trap, lumbar paraspinals at the L4-5 level.    All injections were done after  after negative drawback for blood. The patient tolerated the procedure well. Post procedure instructions were given.  folow up in 2 months

## 2015-09-13 NOTE — Addendum Note (Signed)
Addended byRoland Rack on: 09/13/2015 02:58 PM   Modules accepted: Orders

## 2015-09-13 NOTE — Patient Instructions (Signed)
PLEASE CALL ME WITH ANY PROBLEMS OR QUESTIONS ([phone])  

## 2015-09-22 LAB — TOXASSURE SELECT,+ANTIDEPR,UR: PDF: 0

## 2015-09-23 NOTE — Progress Notes (Signed)
Urine drug screen for this encounter is consistent for prescribed medications.   

## 2015-09-30 ENCOUNTER — Telehealth: Payer: Self-pay

## 2015-09-30 ENCOUNTER — Other Ambulatory Visit: Payer: Self-pay | Admitting: *Deleted

## 2015-09-30 DIAGNOSIS — G43109 Migraine with aura, not intractable, without status migrainosus: Secondary | ICD-10-CM

## 2015-09-30 DIAGNOSIS — M47816 Spondylosis without myelopathy or radiculopathy, lumbar region: Secondary | ICD-10-CM

## 2015-09-30 DIAGNOSIS — Z79899 Other long term (current) drug therapy: Secondary | ICD-10-CM

## 2015-09-30 DIAGNOSIS — M797 Fibromyalgia: Secondary | ICD-10-CM

## 2015-09-30 DIAGNOSIS — Z5181 Encounter for therapeutic drug level monitoring: Secondary | ICD-10-CM

## 2015-09-30 DIAGNOSIS — M1712 Unilateral primary osteoarthritis, left knee: Secondary | ICD-10-CM

## 2015-09-30 DIAGNOSIS — G894 Chronic pain syndrome: Secondary | ICD-10-CM

## 2015-09-30 DIAGNOSIS — M26609 Unspecified temporomandibular joint disorder, unspecified side: Secondary | ICD-10-CM

## 2015-09-30 MED ORDER — TIZANIDINE HCL 2 MG PO TABS: 2.0000 mg | ORAL_TABLET | Freq: Three times a day (TID) | ORAL | 3 refills | Status: DC | PRN

## 2015-09-30 NOTE — Telephone Encounter (Signed)
Pt called for a refill on her tizanidine. Tizanidine sent, pt aware.

## 2015-10-11 ENCOUNTER — Telehealth: Payer: Self-pay | Admitting: Physical Medicine & Rehabilitation

## 2015-10-11 NOTE — Telephone Encounter (Signed)
[facility] [place] [facility]'s prescription for Dilaudid and Oxy IR.  Please call them at [phone].  Also, Karlyne Greenspan called as well.  She would like for someone to call her when this is done.

## 2015-10-12 NOTE — Telephone Encounter (Signed)
Verified [facility] [place] [facility] [place] notified.

## 2015-11-08 ENCOUNTER — Encounter: Payer: Self-pay | Admitting: Physical Medicine & Rehabilitation

## 2015-11-08 ENCOUNTER — Encounter
Payer: BLUE CROSS/BLUE SHIELD | Attending: Physical Medicine & Rehabilitation | Admitting: Physical Medicine & Rehabilitation

## 2015-11-08 VITALS — BP 102/71 | HR 67

## 2015-11-08 DIAGNOSIS — G43109 Migraine with aura, not intractable, without status migrainosus: Secondary | ICD-10-CM | POA: Insufficient documentation

## 2015-11-08 DIAGNOSIS — M1712 Unilateral primary osteoarthritis, left knee: Secondary | ICD-10-CM

## 2015-11-08 DIAGNOSIS — Z79899 Other long term (current) drug therapy: Secondary | ICD-10-CM | POA: Diagnosis present

## 2015-11-08 DIAGNOSIS — Z5181 Encounter for therapeutic drug level monitoring: Secondary | ICD-10-CM | POA: Diagnosis present

## 2015-11-08 DIAGNOSIS — M170 Bilateral primary osteoarthritis of knee: Secondary | ICD-10-CM

## 2015-11-08 DIAGNOSIS — M797 Fibromyalgia: Secondary | ICD-10-CM | POA: Diagnosis not present

## 2015-11-08 DIAGNOSIS — M47816 Spondylosis without myelopathy or radiculopathy, lumbar region: Secondary | ICD-10-CM

## 2015-11-08 DIAGNOSIS — M26609 Unspecified temporomandibular joint disorder, unspecified side: Secondary | ICD-10-CM | POA: Diagnosis not present

## 2015-11-08 MED ORDER — OXYCODONE HCL 5 MG PO TABS: 5.0000 mg | ORAL_TABLET | Freq: Three times a day (TID) | ORAL | 0 refills | Status: DC | PRN

## 2015-11-08 MED ORDER — HYDROMORPHONE HCL 4 MG PO TABS: 4.0000 mg | ORAL_TABLET | ORAL | 0 refills | Status: DC | PRN

## 2015-11-08 NOTE — Patient Instructions (Signed)
PLEASE CALL ME WITH ANY PROBLEMS OR QUESTIONS ([phone])  

## 2015-11-08 NOTE — Progress Notes (Signed)
Subjective:    Patient ID: Shannon Meadows, female    DOB: [birth year], 45 y.o.   MRN: [id number]  HPI   Shannon Meadows is here in follow up of her chornic pain. At last visit I performed botox for migraine. She has had improvement in her frontal symptoms as well as her shoulders. She continues to have a lot of neck pain. She wants trigger point injections today to her neck in fact.   She continues to work as an Therapist, sports. There have been no changes to her work load.   She has seen ortho who has injected her knees. They have discussed surgical options which include arthroscopic and/or joint replacement surgery.   She continues on medications as I have prescribed which include oxycodone and dilaudid.       Pain Inventory Average Pain 6 Pain Right Now 7 My pain is intermittent, constant, sharp, burning, dull, stabbing and aching  In the last 24 hours, has pain interfered with the following? General activity 5 Relation with others 5 Enjoyment of life 5 What TIME of day is your pain at its worst? morning, daytime Sleep (in general) Fair  Pain is worse with: sitting, inactivity, standing and some activites Pain improves with: rest, heat/ice, medication and injections Relief from Meds: 7  Mobility walk without assistance ability to climb steps?  yes do you drive?  yes Do you have any goals in this area?  yes  Function employed # of hrs/week 36+ Do you have any goals in this area?  yes  Neuro/Psych No problems in this area  Prior Studies Any changes since last visit?  no  Physicians involved in your care Any changes since last visit?  no   Family History  Problem Relation Age of Onset  . Hyperlipidemia Father   . Hypertension Father   . Arthritis Father   . Heart disease Father   . Asthma Mother   . Diabetes Mother   . Arthritis Mother   . Heart disease Mother   . Heart disease    . Lung disease    . Diabetes    . Hypertension     Social History   Social History  .  Marital status: Single    Spouse name: N/A  . Number of children: N/A  . Years of education: N/A   Social History Main Topics  . Smoking status: Never Smoker  . Smokeless tobacco: Never Used  . Alcohol use None  . Drug use: Unknown  . Sexual activity: Not Asked   Other Topics Concern  . None   Social History Narrative  . None   Past Surgical History:  Procedure Laterality Date  . KNEE ARTHROSCOPY    . [place]  . ULNAR COLLATERAL LIGAMENT RECONSTRUCTION     Past Medical History:  Diagnosis Date  . Back pain   . Cervicalgia   . Chronic headaches   . Chronic pain syndrome   . Endometriosis   . Facet syndrome, lumbar   . Hyperlipidemia   . Hypertension   . Lumbar sprain   . Migraines   . Myofascial pain   . Neck pain   . TMJ syndrome    BP 102/71   Pulse 67   SpO2 95%   Opioid Risk Score:   Fall Risk Score:  `1  Depression screen PHQ 2/9  Depression screen [facility] 2/9 04/19/2015 04/10/2014  Decreased Interest 0 0  Down, Depressed, Hopeless 1 1  PHQ - 2 Score 1 1  Altered sleeping - 2  Tired, decreased energy - 2  Change in appetite - 1  Feeling bad or failure about yourself  - 0  Trouble concentrating - 0  Moving slowly or fidgety/restless - 0  Suicidal thoughts - 0  PHQ-9 Score - 6    Review of Systems  Constitutional: Positive for unexpected weight change.  HENT: Negative.   Eyes: Negative.   Respiratory: Negative.   Cardiovascular: Negative.   Gastrointestinal: Positive for nausea and vomiting.  Endocrine: Negative.   Genitourinary: Negative.   Musculoskeletal: Positive for back pain and neck pain.  Skin: Negative.   Allergic/Immunologic: Negative.   Neurological: Negative.   Hematological: Negative.   Psychiatric/Behavioral: Negative.        Objective:   Physical Exam  Morbidly obese  HENT: dentition remains poor  Head: Normocephalic and atraumatic.  Eyes: EOM are normal. Pupils are equal, round, and reactive  to light.  Cardiovascular: Normal rate.  Pulmonary/Chest: Effort normal and breath sounds normal.  Abdominal: Soft.  Musculoskeletal: head forward position Cervical back:  Tenderness in the semispinalis capitis. Facet maneuvers provoked pain.  Lumbar back: She exhibits decreased range of motion, tenderness, bony tenderness  Back:low back generally tender, she has noticeable spasm along the right.decreased flexion/extension today. Substantial antalgia left leg with weight bearing but also antaglic on right. She tends to use her back to assist with swing of the right leg.   Neurological: She is alert and oriented to person, place, and time. Gait is improved. Less antalgia seen.  Psych: mood is good. Pleasant.   Assessment & Plan:   ASSESSMENT:  1. History of chronic migraines with aura and cervicalgia with shoulder girdle/cervical myofascial pain.   -I suspect an underlying cervical spondylosis as well. 2. Temporomandibular joint dysfunction.  3. 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 Back symptoms worse because of altered gait mechanics. Recent MRI performed  4. Osteoarthritis of the left knee greater than right with meniscal injury. Chondromalacia patellae.  5. Plantar fibroids.    PLAN:  1. Knees per ortho in [place]  2. After informed consent and preparation of the skin with isopropyl alcohol, I injected the bilateral  splenius capitis (x w total) with 2cc of 1% lidocaine. The patient tolerated well, and no complications were experienced. Post-injection instructions were provided.  3. I refilled her Dilaudid 4 mg #12 (for severe headaches) and oxycodone 5 mg #90 today. She was given prescriptions for all these medications for an additional month.  4. Continue topamax to 100mg  qhs for migraine prophylaxis ---3 month rx was provided.  5. Reviewed potential plan for neck. Consider cervical xrays to start. Discussed posture/traction 6. I will see her back here in about 2  months.

## 2015-12-07 ENCOUNTER — Telehealth: Payer: Self-pay | Admitting: Physical Medicine & Rehabilitation

## 2015-12-07 NOTE — Telephone Encounter (Signed)
[facility] for patient, oxycodone and dilaudid.  Please call them at [phone].

## 2015-12-07 NOTE — Telephone Encounter (Signed)
.  verified

## 2016-01-04 ENCOUNTER — Encounter: Payer: BLUE CROSS/BLUE SHIELD | Admitting: Physical Medicine & Rehabilitation

## 2016-01-04 ENCOUNTER — Telehealth: Payer: Self-pay | Admitting: *Deleted

## 2016-01-04 DIAGNOSIS — M26609 Unspecified temporomandibular joint disorder, unspecified side: Secondary | ICD-10-CM

## 2016-01-04 DIAGNOSIS — G43109 Migraine with aura, not intractable, without status migrainosus: Secondary | ICD-10-CM

## 2016-01-04 DIAGNOSIS — Z5181 Encounter for therapeutic drug level monitoring: Secondary | ICD-10-CM

## 2016-01-04 DIAGNOSIS — M170 Bilateral primary osteoarthritis of knee: Secondary | ICD-10-CM

## 2016-01-04 DIAGNOSIS — Z79899 Other long term (current) drug therapy: Secondary | ICD-10-CM

## 2016-01-04 DIAGNOSIS — M797 Fibromyalgia: Secondary | ICD-10-CM

## 2016-01-04 DIAGNOSIS — M47816 Spondylosis without myelopathy or radiculopathy, lumbar region: Secondary | ICD-10-CM

## 2016-01-04 DIAGNOSIS — M1712 Unilateral primary osteoarthritis, left knee: Secondary | ICD-10-CM

## 2016-01-04 MED ORDER — OXYCODONE HCL 5 MG PO TABS: 5.0000 mg | ORAL_TABLET | Freq: Three times a day (TID) | ORAL | 0 refills | Status: DC | PRN

## 2016-01-04 MED ORDER — HYDROMORPHONE HCL 4 MG PO TABS: 4.0000 mg | ORAL_TABLET | ORAL | 0 refills | Status: DC | PRN

## 2016-01-04 NOTE — Telephone Encounter (Signed)
Valynda called and she had her teeth pulled 12/30/15 and she didn't realize she would not be able to drive afterwards. She got up this morning and almost passed out so she missed her appt. She needs to get her Rx picked up by family member and she has rescheduled her appt to 02/07/16 (first avail).  Rx printed for Naaman Plummer to sign. Left VM for Roneshia that it will be available after noon.

## 2016-02-07 ENCOUNTER — Encounter
Payer: BLUE CROSS/BLUE SHIELD | Attending: Physical Medicine & Rehabilitation | Admitting: Physical Medicine & Rehabilitation

## 2016-02-07 ENCOUNTER — Encounter: Payer: Self-pay | Admitting: Physical Medicine & Rehabilitation

## 2016-02-07 VITALS — BP 130/79 | HR 68

## 2016-02-07 DIAGNOSIS — Z79899 Other long term (current) drug therapy: Secondary | ICD-10-CM | POA: Insufficient documentation

## 2016-02-07 DIAGNOSIS — M170 Bilateral primary osteoarthritis of knee: Secondary | ICD-10-CM | POA: Diagnosis present

## 2016-02-07 DIAGNOSIS — M797 Fibromyalgia: Secondary | ICD-10-CM | POA: Insufficient documentation

## 2016-02-07 DIAGNOSIS — M1712 Unilateral primary osteoarthritis, left knee: Secondary | ICD-10-CM

## 2016-02-07 DIAGNOSIS — Z5181 Encounter for therapeutic drug level monitoring: Secondary | ICD-10-CM | POA: Insufficient documentation

## 2016-02-07 DIAGNOSIS — M47816 Spondylosis without myelopathy or radiculopathy, lumbar region: Secondary | ICD-10-CM | POA: Diagnosis not present

## 2016-02-07 DIAGNOSIS — M26609 Unspecified temporomandibular joint disorder, unspecified side: Secondary | ICD-10-CM | POA: Insufficient documentation

## 2016-02-07 DIAGNOSIS — G43109 Migraine with aura, not intractable, without status migrainosus: Secondary | ICD-10-CM | POA: Diagnosis not present

## 2016-02-07 MED ORDER — SUMATRIPTAN SUCCINATE 6 MG/0.5ML ~~LOC~~ SOLN: 6.0000 mg | Freq: Every day | SUBCUTANEOUS | 3 refills | Status: DC | PRN

## 2016-02-07 MED ORDER — OXYCODONE HCL 5 MG PO TABS: 5.0000 mg | ORAL_TABLET | Freq: Three times a day (TID) | ORAL | 0 refills | Status: DC | PRN

## 2016-02-07 MED ORDER — HYDROMORPHONE HCL 4 MG PO TABS: 4.0000 mg | ORAL_TABLET | ORAL | 0 refills | Status: DC | PRN

## 2016-02-07 NOTE — Patient Instructions (Signed)
PLEASE CALL ME WITH ANY PROBLEMS OR QUESTIONS ([phone])  

## 2016-02-07 NOTE — Progress Notes (Signed)
Subjective:    Patient ID: Shannon Meadows, female    DOB: 05-08-1970, 46 y.o.   MRN: [id number]  HPI   Shannon Meadows is here in follow up of her chronic pain. She has reported some "numbness" along the left buttock/lateral thigh. It is not painful and she hasn't reported any back tenderness or weakness.   She continues to have left knee pain which she deals with. She follows ortho who is adopting a conservative approach at present. They wanted her to get her teeth fixed first. (her teeth are all out now). She may a candidate for arthroscopic surgery.   Headaches are stable but she has associated cervicalgia and myofascial pain. The neck symptoms at this point are more painful than her head. She is stretching her neck daily and addressing posture  From a narcotic standpoint for pain she remains on oxycodone and dilaudid which remain effective.      Pain Inventory Average Pain 5 Pain Right Now 7 My pain is intermittent, sharp, burning, dull, stabbing, tingling and aching  In the last 24 hours, has pain interfered with the following? General activity 5 Relation with others 4 Enjoyment of life 4 What TIME of day is your pain at its worst? daytime Sleep (in general) Poor  Pain is worse with: inactivity, standing and some activites Pain improves with: rest, heat/ice, pacing activities, medication and injections Relief from Meds: 7  Mobility walk without assistance ability to climb steps?  yes do you drive?  yes Do you have any goals in this area?  yes  Function employed # of hrs/week 36 Do you have any goals in this area?  yes  Neuro/Psych No problems in this area  Prior Studies Any changes since last visit?  no  Physicians involved in your care Any changes since last visit?  no   Family History  Problem Relation Age of Onset  . Hyperlipidemia Father   . Hypertension Father   . Arthritis Father   . Heart disease Father   . Asthma Mother   . Diabetes Mother   .  Arthritis Mother   . Heart disease Mother   . Heart disease    . Lung disease    . Diabetes    . Hypertension     Social History   Social History  . Marital status: Single    Spouse name: N/A  . Number of children: N/A  . Years of education: N/A   Social History Main Topics  . Smoking status: Never Smoker  . Smokeless tobacco: Never Used  . Alcohol use Not on file  . Drug use: Unknown  . Sexual activity: Not on file   Other Topics Concern  . Not on file   Social History Narrative  . No narrative on file   Past Surgical History:  Procedure Laterality Date  . KNEE ARTHROSCOPY    . [place]  . ULNAR COLLATERAL LIGAMENT RECONSTRUCTION     Past Medical History:  Diagnosis Date  . Back pain   . Cervicalgia   . Chronic headaches   . Chronic pain syndrome   . Endometriosis   . Facet syndrome, lumbar   . Hyperlipidemia   . Hypertension   . Lumbar sprain   . Migraines   . Myofascial pain   . Neck pain   . TMJ syndrome    There were no vitals taken for this visit.  Opioid Risk Score:   Fall Risk Score:  `  1  Depression screen PHQ 2/9  Depression screen [facility] 2/9 04/19/2015 04/10/2014  Decreased Interest 0 0  Down, Depressed, Hopeless 1 1  PHQ - 2 Score 1 1  Altered sleeping - 2  Tired, decreased energy - 2  Change in appetite - 1  Feeling bad or failure about yourself  - 0  Trouble concentrating - 0  Moving slowly or fidgety/restless - 0  Suicidal thoughts - 0  PHQ-9 Score - 6   Review of Systems  Constitutional: Negative.   HENT: Negative.   Eyes: Negative.   Respiratory: Negative.   Cardiovascular: Negative.   Gastrointestinal: Negative.   Endocrine: Negative.   Genitourinary: Negative.   Musculoskeletal: Positive for back pain.  Skin: Negative.   Allergic/Immunologic: Negative.   Neurological: Negative.   Hematological: Negative.   Psychiatric/Behavioral: Negative.   All other systems reviewed and are negative.        Objective:   Physical Exam  Morbidly obese HENT: dentition remains poor  Head: Normocephalic and atraumatic.  Eyes: EOM are normal. Pupils are equal, round, and reactive to light.  Cardiovascular: RRR  Pulmonary/Chest: clear, normal effort  Abdominal: Soft.  Musculoskeletal: head forward position Cervical back: Tenderness in the semispinalis capitis. Facet maneuvers provoked pain. Lumbar back: tenderness at upper splenius capitis and mid SCM's bilaterally  Back:low back generally tender. No increase in pain today with flexion,extension.   Neurological: She is alert and oriented to person, place, and time. Strength 5/5. DTR's 2+/ no sensory loss. Psych: mood is good. Pleasant.   Assessment & Plan:  ASSESSMENT:  1. History of chronic migraines with aura and cervicalgia with shoulder girdle/cervical myofascial pain.              -I suspect an underlying cervical spondylosis also. 2. Temporomandibular joint dysfunction.  3. 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 Back symptoms worse because of altered gait mechanics. Recent MRI performed  4. Osteoarthritis of the left knee greater than right with meniscal injury. Chondromalacia patellae.  5. Plantar fibroids.   PLAN:  1. Knees per ortho in [place]  2.  After informed consent and preparation of the skin with isopropyl alcohol, I injected the bilateral splenius capitis, bilateral mid SCM's each with 2cc of 1% lidocaine (4 total). The patient tolerated well, and no complications were experienced. Post-injection instructions were provided. Pt was experiencing relief before she left office 3. I refilled her Dilaudid 4 mg #12 (for severe migraines) and oxycodone 5 mg #90 today. She was given prescriptions for all these medications for an additional month.  4. Continue topamax to 100mg  qhs for migraine prophylaxis ---3 month rx was provided.  5. Continue with HEP for neck. Needs to maintain posture.  6. I will see her  back here in about 2 months.

## 2016-02-13 LAB — TOXASSURE SELECT,+ANTIDEPR,UR

## 2016-02-15 NOTE — Progress Notes (Signed)
Urine drug screen for this encounter is consistent for prescribed medication 

## 2016-03-01 ENCOUNTER — Other Ambulatory Visit: Payer: Self-pay

## 2016-03-01 MED ORDER — PREGABALIN 300 MG PO CAPS: 300.0000 mg | ORAL_CAPSULE | Freq: Two times a day (BID) | ORAL | 5 refills | Status: DC

## 2016-03-02 ENCOUNTER — Other Ambulatory Visit: Payer: Self-pay | Admitting: *Deleted

## 2016-03-09 MED ORDER — PREGABALIN 300 MG PO CAPS: 300.0000 mg | ORAL_CAPSULE | Freq: Two times a day (BID) | ORAL | 5 refills | Status: DC

## 2016-04-04 ENCOUNTER — Encounter: Payer: Self-pay | Admitting: Physical Medicine & Rehabilitation

## 2016-04-04 ENCOUNTER — Encounter
Payer: BLUE CROSS/BLUE SHIELD | Attending: Physical Medicine & Rehabilitation | Admitting: Physical Medicine & Rehabilitation

## 2016-04-04 VITALS — BP 136/75 | HR 50 | Resp 14

## 2016-04-04 DIAGNOSIS — Z5181 Encounter for therapeutic drug level monitoring: Secondary | ICD-10-CM | POA: Diagnosis present

## 2016-04-04 DIAGNOSIS — G43109 Migraine with aura, not intractable, without status migrainosus: Secondary | ICD-10-CM

## 2016-04-04 DIAGNOSIS — M47816 Spondylosis without myelopathy or radiculopathy, lumbar region: Secondary | ICD-10-CM

## 2016-04-04 DIAGNOSIS — M26609 Unspecified temporomandibular joint disorder, unspecified side: Secondary | ICD-10-CM | POA: Diagnosis not present

## 2016-04-04 DIAGNOSIS — M170 Bilateral primary osteoarthritis of knee: Secondary | ICD-10-CM | POA: Diagnosis not present

## 2016-04-04 DIAGNOSIS — M791 Myalgia: Secondary | ICD-10-CM | POA: Diagnosis not present

## 2016-04-04 DIAGNOSIS — M1712 Unilateral primary osteoarthritis, left knee: Secondary | ICD-10-CM

## 2016-04-04 DIAGNOSIS — M797 Fibromyalgia: Secondary | ICD-10-CM | POA: Diagnosis not present

## 2016-04-04 DIAGNOSIS — M7918 Myalgia, other site: Secondary | ICD-10-CM

## 2016-04-04 DIAGNOSIS — Z79899 Other long term (current) drug therapy: Secondary | ICD-10-CM | POA: Insufficient documentation

## 2016-04-04 MED ORDER — OXYCODONE HCL 5 MG PO TABS: 5.0000 mg | ORAL_TABLET | Freq: Three times a day (TID) | ORAL | 0 refills | Status: DC | PRN

## 2016-04-04 MED ORDER — HYDROMORPHONE HCL 4 MG PO TABS: 4.0000 mg | ORAL_TABLET | ORAL | 0 refills | Status: DC | PRN

## 2016-04-04 NOTE — Progress Notes (Signed)
Subjective:    Patient ID: Shannon Meadows, female    DOB: 1970/02/16, 46 y.o.   MRN: [id number]  HPI   Shannon Meadows is here in regards to her chronic pain. She states that her pain levels are fairly stable. She has had some more pain develop around her neck and shoulders over the last couple weeks and would like TPI's again. She had very good results with the last set we did two months ago, particularly at the sternocleidomastoid areas  She continues on dilaudid and oxycodone for pain control. She uses the dilaudid primarily for severe pain, migraines.   She continue to be followed by ortho for her knees.    Pain Inventory Average Pain 8 Pain Right Now 6 My pain is sharp, dull, stabbing and aching  In the last 24 hours, has pain interfered with the following? General activity 6 Relation with others 6 Enjoyment of life 6 What TIME of day is your pain at its worst? daytime Sleep (in general) Fair  Pain is worse with: walking, bending, sitting, inactivity, standing, unsure and some activites Pain improves with: heat/ice, pacing activities, medication, TENS and injections Relief from Meds: 5  Mobility walk without assistance ability to climb steps?  yes do you drive?  yes Do you have any goals in this area?  yes  Function employed # of hrs/week 36-48 Do you have any goals in this area?  yes  Neuro/Psych No problems in this area  Prior Studies Any changes since last visit?  no  Physicians involved in your care Any changes since last visit?  no   Family History  Problem Relation Age of Onset  . Hyperlipidemia Father   . Hypertension Father   . Arthritis Father   . Heart disease Father   . Asthma Mother   . Diabetes Mother   . Arthritis Mother   . Heart disease Mother   . Heart disease    . Lung disease    . Diabetes    . Hypertension     Social History   Social History  . Marital status: Single    Spouse name: N/A  . Number of children: N/A  . Years of  education: N/A   Social History Main Topics  . Smoking status: Never Smoker  . Smokeless tobacco: Never Used  . Alcohol use None  . Drug use: Unknown  . Sexual activity: Not Asked   Other Topics Concern  . None   Social History Narrative  . None   Past Surgical History:  Procedure Laterality Date  . KNEE ARTHROSCOPY    . [place]  . ULNAR COLLATERAL LIGAMENT RECONSTRUCTION     Past Medical History:  Diagnosis Date  . Back pain   . Cervicalgia   . Chronic headaches   . Chronic pain syndrome   . Endometriosis   . Facet syndrome, lumbar   . Hyperlipidemia   . Hypertension   . Lumbar sprain   . Migraines   . Myofascial pain   . Neck pain   . TMJ syndrome    BP 136/75 (BP Location: Left Wrist, Patient Position: Sitting, Cuff Size: Normal)   Pulse (!) 50   Resp 14   SpO2 97%   Opioid Risk Score:   Fall Risk Score:  `1  Depression screen PHQ 2/9  Depression screen [facility] 2/9 04/19/2015 04/10/2014  Decreased Interest 0 0  Down, Depressed, Hopeless 1 1  PHQ - 2 Score  1 1  Altered sleeping - 2  Tired, decreased energy - 2  Change in appetite - 1  Feeling bad or failure about yourself  - 0  Trouble concentrating - 0  Moving slowly or fidgety/restless - 0  Suicidal thoughts - 0  PHQ-9 Score - 6    Review of Systems  Constitutional: Negative.   HENT: Negative.   Eyes: Negative.   Respiratory: Negative.   Cardiovascular: Negative.   Gastrointestinal: Negative.   Endocrine: Negative.   Genitourinary: Negative.   Musculoskeletal: Positive for arthralgias, back pain, myalgias and neck pain.  Skin: Negative.   Allergic/Immunologic: Negative.   Neurological: Negative.   Hematological: Negative.   Psychiatric/Behavioral: Negative.   All other systems reviewed and are negative.      Objective:   Physical Exam  Morbidly obese HENT: dentition remains poor  Head: Normocephalic and atraumatic.  Eyes: EOM are normal. Pupils are equal,  round, and reactive to light.  Cardiovascular: RRR Pulmonary/Chest: normal effort Abdominal: Soft.  Musculoskeletal: head forward Cervical back: Tenderness in the splenius capitis an SCM's bilaterally. Facet maneuvers provoked pain. Back:low back generally tender. No increase in pain today with flexion,extension.   Neurological: She is alert and oriented to person, place, and time. Strength 5/5. DTR's 2+/ no sensory loss. Psych: mood is good. Pleasant.   Assessment & Plan:  ASSESSMENT:  1. History of chronic migraines with aura and cervicalgia with shoulder girdle/cervical myofascial pain.  -I suspect an underlying cervical spondylosis as wel 2. Temporomandibular joint dysfunction.  3. 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 Back symptoms worse because of altered gait mechanics. Recent MRI performed  4. Osteoarthritis of the left knee greater than right with meniscal injury. Chondromalacia patellae.  5. Plantar fibroids.   PLAN:  1. Knees per ortho in [place] 2.  After informed consent and preparation of the skin with isopropyl alcohol, after drawback,  I injected the bilateral SCM's and splenius capitis muscles (4 in total) each with 2cc of 1% lidocaine. The patient tolerated well, and no complications were experienced. Post-injection instructions were provided. 3. I refilled her Dilaudid 4 mg #12 (for severe migraines)and oxycodone 5 mg #90 today. I gave her rx'es for next month. We will continue the opioid monitoring program, this consists of regular clinic visits, examinations, urine drug screen, pill counts as well as use of [place] Controlled Substance Reporting System. [place] was reviewed today.  4. Continue topamax to 100mg  qhs for migraine prophylaxis ---no new rx was required 5. Continue with HEP for neck. Needs to maintain posture.  6. I will see her back here in about 2 months.

## 2016-04-04 NOTE — Patient Instructions (Signed)
PLEASE FEEL FREE TO CALL OUR OFFICE WITH ANY PROBLEMS OR QUESTIONS ([phone])      

## 2016-04-15 ENCOUNTER — Other Ambulatory Visit: Payer: Self-pay | Admitting: Physical Medicine & Rehabilitation

## 2016-04-15 DIAGNOSIS — M26609 Unspecified temporomandibular joint disorder, unspecified side: Secondary | ICD-10-CM

## 2016-04-15 DIAGNOSIS — G43109 Migraine with aura, not intractable, without status migrainosus: Secondary | ICD-10-CM

## 2016-04-15 DIAGNOSIS — Z5181 Encounter for therapeutic drug level monitoring: Secondary | ICD-10-CM

## 2016-04-15 DIAGNOSIS — M1712 Unilateral primary osteoarthritis, left knee: Secondary | ICD-10-CM

## 2016-04-15 DIAGNOSIS — Z79899 Other long term (current) drug therapy: Secondary | ICD-10-CM

## 2016-04-15 DIAGNOSIS — M797 Fibromyalgia: Secondary | ICD-10-CM

## 2016-04-15 DIAGNOSIS — G894 Chronic pain syndrome: Secondary | ICD-10-CM

## 2016-05-02 ENCOUNTER — Other Ambulatory Visit: Payer: Self-pay | Admitting: *Deleted

## 2016-05-02 DIAGNOSIS — G43109 Migraine with aura, not intractable, without status migrainosus: Secondary | ICD-10-CM

## 2016-05-02 MED ORDER — TOPIRAMATE 100 MG PO TABS: ORAL_TABLET | ORAL | 3 refills | Status: DC

## 2016-05-03 ENCOUNTER — Other Ambulatory Visit: Payer: Self-pay | Admitting: *Deleted

## 2016-05-03 ENCOUNTER — Telehealth: Payer: Self-pay

## 2016-05-03 DIAGNOSIS — G43109 Migraine with aura, not intractable, without status migrainosus: Secondary | ICD-10-CM

## 2016-05-03 MED ORDER — TOPIRAMATE 100 MG PO TABS: ORAL_TABLET | ORAL | 3 refills | Status: DC

## 2016-05-03 NOTE — Telephone Encounter (Signed)
Recieved voice mail from [place], stated needed verbal authorization to refill patients hydromorphone and oxycodone, called pharmacy back and confirmed medications, called patient and informed her that calls were made.

## 2016-06-06 ENCOUNTER — Encounter: Payer: Self-pay | Admitting: Physical Medicine & Rehabilitation

## 2016-06-06 ENCOUNTER — Encounter
Payer: BLUE CROSS/BLUE SHIELD | Attending: Physical Medicine & Rehabilitation | Admitting: Physical Medicine & Rehabilitation

## 2016-06-06 VITALS — BP 145/84 | HR 64

## 2016-06-06 DIAGNOSIS — M26609 Unspecified temporomandibular joint disorder, unspecified side: Secondary | ICD-10-CM | POA: Diagnosis not present

## 2016-06-06 DIAGNOSIS — M797 Fibromyalgia: Secondary | ICD-10-CM | POA: Diagnosis present

## 2016-06-06 DIAGNOSIS — M1712 Unilateral primary osteoarthritis, left knee: Secondary | ICD-10-CM | POA: Diagnosis not present

## 2016-06-06 DIAGNOSIS — M791 Myalgia: Secondary | ICD-10-CM | POA: Diagnosis not present

## 2016-06-06 DIAGNOSIS — G894 Chronic pain syndrome: Secondary | ICD-10-CM | POA: Diagnosis not present

## 2016-06-06 DIAGNOSIS — M7918 Myalgia, other site: Secondary | ICD-10-CM

## 2016-06-06 DIAGNOSIS — Z5181 Encounter for therapeutic drug level monitoring: Secondary | ICD-10-CM | POA: Diagnosis not present

## 2016-06-06 DIAGNOSIS — G43109 Migraine with aura, not intractable, without status migrainosus: Secondary | ICD-10-CM | POA: Insufficient documentation

## 2016-06-06 DIAGNOSIS — Z79899 Other long term (current) drug therapy: Secondary | ICD-10-CM | POA: Insufficient documentation

## 2016-06-06 DIAGNOSIS — M170 Bilateral primary osteoarthritis of knee: Secondary | ICD-10-CM

## 2016-06-06 DIAGNOSIS — M47816 Spondylosis without myelopathy or radiculopathy, lumbar region: Secondary | ICD-10-CM

## 2016-06-06 MED ORDER — OXYCODONE HCL 5 MG PO TABS: 5.0000 mg | ORAL_TABLET | Freq: Three times a day (TID) | ORAL | 0 refills | Status: DC | PRN

## 2016-06-06 MED ORDER — HYDROMORPHONE HCL 4 MG PO TABS: 4.0000 mg | ORAL_TABLET | ORAL | 0 refills | Status: DC | PRN

## 2016-06-06 NOTE — Patient Instructions (Signed)
PLEASE FEEL FREE TO CALL OUR OFFICE WITH ANY PROBLEMS OR QUESTIONS ([phone])      

## 2016-06-06 NOTE — Progress Notes (Signed)
Subjective:    Patient ID: Shannon Meadows, female    DOB: Sep 24, 1970, 46 y.o.   MRN: [id number]  HPI   Shannon Meadows is here in follow up of her chronic pain. She continues to do fairly well. Her migraines are persistent. Her neck pain is recurring again after improving post TPI's. She follows with ortho regarding her knees receiving regular injections.   She continues with oxycodone and dilaudid for pain control. These continue to be effective.    Pain Inventory Average Pain 6 Pain Right Now 6 My pain is intermittent, constant, sharp, burning, dull, stabbing, tingling and aching  In the last 24 hours, has pain interfered with the following? General activity 6 Relation with others 4 Enjoyment of life 4 What TIME of day is your pain at its worst? daytime Sleep (in general) Fair  Pain is worse with: walking, sitting, inactivity, standing, unsure and some activites Pain improves with: rest, pacing activities, medication and injections Relief from Meds: 5  Mobility walk without assistance ability to climb steps?  yes  Function employed # of hrs/week 48  Neuro/Psych dizziness  Prior Studies Any changes since last visit?  no  Physicians involved in your care Any changes since last visit?  no   Family History  Problem Relation Age of Onset  . Hyperlipidemia Father   . Hypertension Father   . Arthritis Father   . Heart disease Father   . Asthma Mother   . Diabetes Mother   . Arthritis Mother   . Heart disease Mother   . Heart disease Unknown   . Lung disease Unknown   . Diabetes Unknown   . Hypertension Unknown    Social History   Social History  . Marital status: Single    Spouse name: N/A  . Number of children: N/A  . Years of education: N/A   Social History Main Topics  . Smoking status: Never Smoker  . Smokeless tobacco: Never Used  . Alcohol use None  . Drug use: Unknown  . Sexual activity: Not Asked   Other Topics Concern  . None   Social History  Narrative  . None   Past Surgical History:  Procedure Laterality Date  . KNEE ARTHROSCOPY    . [place]  . ULNAR COLLATERAL LIGAMENT RECONSTRUCTION     Past Medical History:  Diagnosis Date  . Back pain   . Cervicalgia   . Chronic headaches   . Chronic pain syndrome   . Endometriosis   . Facet syndrome, lumbar ([place])   . Hyperlipidemia   . Hypertension   . Lumbar sprain   . Migraines   . Myofascial pain   . Neck pain   . TMJ syndrome    BP (!) 145/84   Pulse 64   SpO2 95%   Opioid Risk Score:   Fall Risk Score:  `1  Depression screen PHQ 2/9  Depression screen [facility] 2/9 04/19/2015 04/10/2014  Decreased Interest 0 0  Down, Depressed, Hopeless 1 1  PHQ - 2 Score 1 1  Altered sleeping - 2  Tired, decreased energy - 2  Change in appetite - 1  Feeling bad or failure about yourself  - 0  Trouble concentrating - 0  Moving slowly or fidgety/restless - 0  Suicidal thoughts - 0  PHQ-9 Score - 6    Review of Systems  Constitutional: Negative.   HENT: Negative.   Eyes: Negative.   Respiratory: Negative.  Cardiovascular: Negative.   Gastrointestinal: Positive for nausea.  Endocrine: Negative.   Genitourinary: Negative.   Musculoskeletal: Positive for joint swelling.  Skin: Negative.   Allergic/Immunologic: Negative.   Neurological: Negative.   Hematological: Negative.   Psychiatric/Behavioral: Negative.   All other systems reviewed and are negative.      Objective:   Physical Exam  Morbidly obese HENT: edentulous  Head: Normocephalic and atraumatic.  Eyes: EOM are normal. Pupils are equal, round, and reactive to light.  Cardiovascular: RRR Pulmonary/Chest: CTA Abdominal: Soft.  Musculoskeletal: head forward Cervical back: Tenderness in the bilateral splenius capitis an SCM's  Facet maneuvers provoke some pain. Back:low back generally tender. No increase in pain today with flexion,extension. Knees with effusions and antalgia  with weight bearing  Neurological: She is alert and oriented to person, place, and time. Strength 5/5. DTR's 2+/ no sensory loss. Psych: mood is good. Pleasant as always  Assessment & Plan:  ASSESSMENT:  1. History of chronic migraines with aura and cervicalgia with shoulder girdle/cervical myofascial pain.  -likely underlying cervical spondylosis as wel 2. Temporomandibular joint dysfunction.  3. 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 Back symptoms worse because of altered gait mechanics. Recent MRI performed  4. Osteoarthritis of the left knee greater than right with meniscal injury. Chondromalacia patellae.  5. Plantar fibroids.    PLAN:  1. Knees per ortho in [place] 2. After informed consent and preparation of the skin with isopropyl alcohol, I injected the bilateral traps and SCM's (4 total) with 2cc of 1% lidocaine. The patient tolerated well, and no complications were experienced. Post-injection instructions were provided. 3. I refilled her Dilaudid 4 mg #12 (for severe migraines)and oxycodone 5 mg #90 today. I gave her rx'es for next month. We will continue the opioid monitoring program, this consists of regular clinic visits, examinations, urine drug screen, pill counts as well as use of [place] Controlled Substance Reporting System. [place]CC[place] was reviewed today.  UDS today 4. Continue topamax to 100mg  qhs for migraine prophylaxis ---  5. Continue with HEP for neck. Needs to maintain posture.  6. I will see her back here in about 2 months. Fifteen minutes of face to face patient care time were spent during this visit. All questions were encouraged and answered.

## 2016-06-10 LAB — TOXASSURE SELECT,+ANTIDEPR,UR

## 2016-06-12 ENCOUNTER — Telehealth: Payer: Self-pay | Admitting: *Deleted

## 2016-06-12 NOTE — Telephone Encounter (Signed)
Urine drug screen for this encounter is consistent for prescribed medication.  Last dose of oxycodone was 06/03/16 so parent drug would not be present.

## 2016-07-05 ENCOUNTER — Telehealth: Payer: Self-pay

## 2016-07-05 NOTE — Telephone Encounter (Signed)
Shannon Meadows called, stated had ran into problems at her normal pharmacy in reguards to her medications, stated had to go to a different [facility] [address] in [place], asked if I could call and verify the prescriptions for her to the new pharmacy, phone call made and prescriptions are being filled.

## 2016-07-06 ENCOUNTER — Telehealth: Payer: Self-pay | Admitting: Physical Medicine & Rehabilitation

## 2016-07-06 NOTE — Telephone Encounter (Signed)
We had to change patient's appointment from July 16 to July 23.  Patient will be out of medication, and would like to get a weeks worth of medication to cover her until she comes in.  Her friends, Lawrence Santiago or [place] will pick the medication for her.  Please call her when ready to be picked up.

## 2016-07-10 NOTE — Telephone Encounter (Signed)
Spoke with patient and asked patient to give our office a call 1Week before medication runs out. Patient states she understands. Patient has been advise to keep appointment schedule for 7/23 with Dr.Swartz

## 2016-07-13 ENCOUNTER — Other Ambulatory Visit: Payer: Self-pay | Admitting: Physical Medicine & Rehabilitation

## 2016-07-13 DIAGNOSIS — G894 Chronic pain syndrome: Secondary | ICD-10-CM

## 2016-07-13 DIAGNOSIS — Z79899 Other long term (current) drug therapy: Secondary | ICD-10-CM

## 2016-07-13 DIAGNOSIS — G43109 Migraine with aura, not intractable, without status migrainosus: Secondary | ICD-10-CM

## 2016-07-13 DIAGNOSIS — M797 Fibromyalgia: Secondary | ICD-10-CM

## 2016-07-13 DIAGNOSIS — Z5181 Encounter for therapeutic drug level monitoring: Secondary | ICD-10-CM

## 2016-07-13 DIAGNOSIS — M26609 Unspecified temporomandibular joint disorder, unspecified side: Secondary | ICD-10-CM

## 2016-07-13 DIAGNOSIS — M1712 Unilateral primary osteoarthritis, left knee: Secondary | ICD-10-CM

## 2016-08-01 ENCOUNTER — Telehealth: Payer: Self-pay | Admitting: *Deleted

## 2016-08-01 ENCOUNTER — Other Ambulatory Visit: Payer: Self-pay | Admitting: *Deleted

## 2016-08-01 DIAGNOSIS — M47816 Spondylosis without myelopathy or radiculopathy, lumbar region: Secondary | ICD-10-CM

## 2016-08-01 DIAGNOSIS — M1712 Unilateral primary osteoarthritis, left knee: Secondary | ICD-10-CM

## 2016-08-01 DIAGNOSIS — M26609 Unspecified temporomandibular joint disorder, unspecified side: Secondary | ICD-10-CM

## 2016-08-01 DIAGNOSIS — Z5181 Encounter for therapeutic drug level monitoring: Secondary | ICD-10-CM

## 2016-08-01 DIAGNOSIS — G43109 Migraine with aura, not intractable, without status migrainosus: Secondary | ICD-10-CM

## 2016-08-01 DIAGNOSIS — M170 Bilateral primary osteoarthritis of knee: Secondary | ICD-10-CM

## 2016-08-01 DIAGNOSIS — M797 Fibromyalgia: Secondary | ICD-10-CM

## 2016-08-01 DIAGNOSIS — Z79899 Other long term (current) drug therapy: Secondary | ICD-10-CM

## 2016-08-01 MED ORDER — OXYCODONE HCL 5 MG PO TABS: 5.0000 mg | ORAL_TABLET | Freq: Three times a day (TID) | ORAL | 0 refills | Status: DC | PRN

## 2016-08-01 MED ORDER — HYDROMORPHONE HCL 4 MG PO TABS: 4.0000 mg | ORAL_TABLET | ORAL | 0 refills | Status: DC | PRN

## 2016-08-01 NOTE — Telephone Encounter (Signed)
Patient left a message for her scripts for oxycodone and dilaudid.  I contacted the patient and she explained that we cancelled her appointment due to Dr. Serita Grit vacation.  Dr. Naaman Plummer agreed to provide scripts. Scripts were printed andsigned.  Patient has friend (either Patty Church or Orpah Clinton) coming by to pick up.

## 2016-08-07 ENCOUNTER — Ambulatory Visit: Payer: BLUE CROSS/BLUE SHIELD | Admitting: Physical Medicine & Rehabilitation

## 2016-08-14 ENCOUNTER — Ambulatory Visit: Payer: BLUE CROSS/BLUE SHIELD | Admitting: Physical Medicine & Rehabilitation

## 2016-09-05 ENCOUNTER — Encounter: Payer: Self-pay | Attending: Physical Medicine & Rehabilitation | Admitting: Physical Medicine & Rehabilitation

## 2016-09-05 ENCOUNTER — Encounter: Payer: Self-pay | Admitting: Physical Medicine & Rehabilitation

## 2016-09-05 VITALS — BP 123/74 | HR 110 | Resp 14

## 2016-09-05 DIAGNOSIS — M26609 Unspecified temporomandibular joint disorder, unspecified side: Secondary | ICD-10-CM | POA: Insufficient documentation

## 2016-09-05 DIAGNOSIS — Z79899 Other long term (current) drug therapy: Secondary | ICD-10-CM | POA: Insufficient documentation

## 2016-09-05 DIAGNOSIS — M170 Bilateral primary osteoarthritis of knee: Secondary | ICD-10-CM

## 2016-09-05 DIAGNOSIS — M797 Fibromyalgia: Secondary | ICD-10-CM | POA: Insufficient documentation

## 2016-09-05 DIAGNOSIS — G43109 Migraine with aura, not intractable, without status migrainosus: Secondary | ICD-10-CM | POA: Insufficient documentation

## 2016-09-05 DIAGNOSIS — M47816 Spondylosis without myelopathy or radiculopathy, lumbar region: Secondary | ICD-10-CM

## 2016-09-05 DIAGNOSIS — Z5181 Encounter for therapeutic drug level monitoring: Secondary | ICD-10-CM | POA: Insufficient documentation

## 2016-09-05 MED ORDER — HYDROMORPHONE HCL 4 MG PO TABS: 4.0000 mg | ORAL_TABLET | ORAL | 0 refills | Status: DC | PRN

## 2016-09-05 MED ORDER — PREGABALIN 300 MG PO CAPS: 300.0000 mg | ORAL_CAPSULE | Freq: Two times a day (BID) | ORAL | 5 refills | Status: DC

## 2016-09-05 MED ORDER — OXYCODONE HCL 5 MG PO TABS: 5.0000 mg | ORAL_TABLET | Freq: Three times a day (TID) | ORAL | 0 refills | Status: DC | PRN

## 2016-09-05 NOTE — Patient Instructions (Signed)
PLEASE FEEL FREE TO CALL OUR OFFICE WITH ANY PROBLEMS OR QUESTIONS ([phone])      

## 2016-09-05 NOTE — Progress Notes (Signed)
Subjective:    Patient ID: Shannon Meadows, female    DOB: [birth year], 46 y.o.   MRN: [id number]  HPI   Shannon Meadows is here in follow up of her chronic pain. 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   Her pain levels are faily stable. She developed some more pain recently with some pressure sores she developed while being seated too long during an orientation program.   Her father has worsening medical issues including advancing dementia.   She remains on oxycodone for her more severe pain and dilaudid for severe sx. She was 3 days short on pills today.      Pain Inventory Average Pain 7 Pain Right Now 6 My pain is intermittent, constant, sharp, burning, dull, stabbing, tingling and aching  In the last 24 hours, has pain interfered with the following? General activity 5 Relation with others 5 Enjoyment of life 5 What TIME of day is your pain at its worst? morning, daytime Sleep (in general) Fair  Pain is worse with: walking, bending, sitting, inactivity, standing and some activites Pain improves with: rest, heat/ice, pacing activities, medication and injections Relief from Meds: 5  Mobility walk without assistance ability to climb steps?  yes do you drive?  yes Do you have any goals in this area?  yes  Function employed # of hrs/week RETURN to work 09/18/2016   not employed: date last employed last day at previous 08/23/2016 Do you have any goals in this area?  yes  Neuro/Psych spasms dizziness depression anxiety  Prior Studies Any changes since last visit?  no  Physicians involved in your care Any changes since last visit?  no   Family History  Problem Relation Age of Onset  . Hyperlipidemia Father   . Hypertension Father   . Arthritis Father   . Heart disease Father   . Asthma Mother   . Diabetes Mother   . Arthritis Mother   . Heart disease Mother   . Heart  disease Unknown   . Lung disease Unknown   . Diabetes Unknown   . Hypertension Unknown    Social History   Social History  . Marital status: Single    Spouse name: N/A  . Number of children: N/A  . Years of education: N/A   Social History Main Topics  . Smoking status: Never Smoker  . Smokeless tobacco: Never Used  . Alcohol use Not on file  . Drug use: Unknown  . Sexual activity: Not on file   Other Topics Concern  . Not on file   Social History Narrative  . No narrative on file   Past Surgical History:  Procedure Laterality Date  . KNEE ARTHROSCOPY    . [place]  . ULNAR COLLATERAL LIGAMENT RECONSTRUCTION     Past Medical History:  Diagnosis Date  . Back pain   . Cervicalgia   . Chronic headaches   . Chronic pain syndrome   . Endometriosis   . Facet syndrome, lumbar ([place])   . Hyperlipidemia   . Hypertension   . Lumbar sprain   . Migraines   . Myofascial pain   . Neck pain   . TMJ syndrome    BP 123/74 (BP Location: Right Wrist, Patient Position: Sitting, Cuff Size: Normal)   Pulse (!) 110   Resp 14   SpO2 95%   Opioid Risk Score:  Fall Risk Score:  `1  Depression screen PHQ 2/9  Depression screen [facility] 2/9 04/19/2015 04/10/2014  Decreased Interest 0 0  Down, Depressed, Hopeless 1 1  PHQ - 2 Score 1 1  Altered sleeping - 2  Tired, decreased energy - 2  Change in appetite - 1  Feeling bad or failure about yourself  - 0  Trouble concentrating - 0  Moving slowly or fidgety/restless - 0  Suicidal thoughts - 0  PHQ-9 Score - 6    Review of Systems  Constitutional: Positive for appetite change.  HENT: Negative.   Eyes: Negative.   Respiratory: Negative.   Cardiovascular: Negative.   Gastrointestinal: Positive for diarrhea and nausea.  Endocrine: Negative.   Genitourinary: Negative.   Musculoskeletal: Positive for arthralgias, neck pain and neck stiffness.       Spasms   Skin: Negative.   Allergic/Immunologic:  Negative.   Neurological: Positive for headaches.  Hematological: Bruises/bleeds easily.  Psychiatric/Behavioral: Positive for dysphoric mood. The patient is nervous/anxious.   All other systems reviewed and are negative.      Objective:   Physical Exam  Morbidly obese HENT: edentulous  Head: Normocephalic and atraumatic.  Eyes: EOM are normal. Pupils are equal, round, and reactive to light.  Cardiovascular: RRR Pulmonary/Chest: CTA Abdominal: Soft.  Musculoskeletal: head forward persistent.  Cervical back: mild tenderness along shoulders and cervical musculature.  Facet maneuvers provoke some pain. Back:low back generally tender.   Knees with effusions and antalgia with weight bearing right more than left  Neurological: She is alert and oriented to person, place, and time. Strength 5/5. DTR's 2+/ no sensory loss. Psych: appears a bit stressed  Assessment & Plan:  ASSESSMENT:  1. History of chronic migraines with aura and cervicalgia with shoulder girdle/cervical myofascial pain.  -likely underlying cervical spondylosis as wel 2. Temporomandibular joint dysfunction.  3. 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 Back symptoms worse because of altered gait mechanics. Recent MRI performed  4. Osteoarthritis of the left knee greater than right with meniscal injury. Chondromalacia patellae.  5. Plantar fibroids.    PLAN:  1. Knees per ortho in [place] 2.Refilled lyrica today 300mg  BID 3. I refilled her Dilaudid 4 mg #12 (for severe migraines)and oxycodone 5 mg #90 today. I gave her rx'es for next month. We will continue the opioid monitoring program, this consists of regular clinic visits, examinations, urine drug screen, pill counts as well as use of [place] Controlled Substance Reporting System. [place] was reviewed today.  I reviewed her CSA agreement as well today 4. Continue topamax to 100mg  qhs for migraine prophylaxis ---  5.  Continue with HEP for neck. Needs to maintain posture.  6. I will see her back here in about 2 months.Ten minutes of face to face patient care time were spent during this visit. All questions were encouraged and answered.

## 2016-11-01 ENCOUNTER — Other Ambulatory Visit: Payer: Self-pay | Admitting: Physical Medicine & Rehabilitation

## 2016-11-01 DIAGNOSIS — M26609 Unspecified temporomandibular joint disorder, unspecified side: Secondary | ICD-10-CM

## 2016-11-01 DIAGNOSIS — Z79899 Other long term (current) drug therapy: Secondary | ICD-10-CM

## 2016-11-01 DIAGNOSIS — M797 Fibromyalgia: Secondary | ICD-10-CM

## 2016-11-01 DIAGNOSIS — Z5181 Encounter for therapeutic drug level monitoring: Secondary | ICD-10-CM

## 2016-11-01 DIAGNOSIS — G43109 Migraine with aura, not intractable, without status migrainosus: Secondary | ICD-10-CM

## 2016-11-01 DIAGNOSIS — M1712 Unilateral primary osteoarthritis, left knee: Secondary | ICD-10-CM

## 2016-11-01 DIAGNOSIS — G894 Chronic pain syndrome: Secondary | ICD-10-CM

## 2016-11-06 ENCOUNTER — Encounter
Payer: BLUE CROSS/BLUE SHIELD | Attending: Physical Medicine & Rehabilitation | Admitting: Physical Medicine & Rehabilitation

## 2016-11-06 ENCOUNTER — Encounter: Payer: Self-pay | Admitting: Physical Medicine & Rehabilitation

## 2016-11-06 VITALS — BP 122/78 | HR 88

## 2016-11-06 DIAGNOSIS — Z5181 Encounter for therapeutic drug level monitoring: Secondary | ICD-10-CM | POA: Diagnosis present

## 2016-11-06 DIAGNOSIS — M797 Fibromyalgia: Secondary | ICD-10-CM | POA: Insufficient documentation

## 2016-11-06 DIAGNOSIS — M26609 Unspecified temporomandibular joint disorder, unspecified side: Secondary | ICD-10-CM | POA: Insufficient documentation

## 2016-11-06 DIAGNOSIS — M47816 Spondylosis without myelopathy or radiculopathy, lumbar region: Secondary | ICD-10-CM

## 2016-11-06 DIAGNOSIS — Z79899 Other long term (current) drug therapy: Secondary | ICD-10-CM | POA: Diagnosis present

## 2016-11-06 DIAGNOSIS — G43109 Migraine with aura, not intractable, without status migrainosus: Secondary | ICD-10-CM | POA: Diagnosis present

## 2016-11-06 DIAGNOSIS — M7918 Myalgia, other site: Secondary | ICD-10-CM

## 2016-11-06 DIAGNOSIS — M170 Bilateral primary osteoarthritis of knee: Secondary | ICD-10-CM | POA: Diagnosis present

## 2016-11-06 MED ORDER — OXYCODONE HCL 5 MG PO TABS: 5.0000 mg | ORAL_TABLET | Freq: Three times a day (TID) | ORAL | 0 refills | Status: DC | PRN

## 2016-11-06 MED ORDER — HYDROMORPHONE HCL 4 MG PO TABS: 4.0000 mg | ORAL_TABLET | ORAL | 0 refills | Status: DC | PRN

## 2016-11-06 NOTE — Progress Notes (Signed)
Subjective:    Patient ID: Shannon Meadows, female    DOB: 30-Dec-1970, 46 y.o.   MRN: [id number]  HPI   Shannon Meadows is here in follow up of her chronic pain. She is now working in [place], [place]. Her father passed away 3 weeks ago which is stressful for her, but she's coping. Her pain levels are fairly consistent. She is having more shoulder and neck pain since I last saw her here.   She remains on her dilaudid and oxycodone for pain relief. These remain generally effective.    Pain Inventory Average Pain 6 Pain Right Now 6 My pain is constant, dull and aching  In the last 24 hours, has pain interfered with the following? General activity 5 Relation with others 5 Enjoyment of life 5 What TIME of day is your pain at its worst? daytime Sleep (in general) Fair  Pain is worse with: walking, bending, sitting, inactivity, standing and some activites Pain improves with: rest, heat/ice, pacing activities, medication and injections Relief from Meds: 6  Mobility walk without assistance ability to climb steps?  yes do you drive?  yes  Function employed # of hrs/week 36  Neuro/Psych numbness tingling depression  Prior Studies Any changes since last visit?  no  Physicians involved in your care Any changes since last visit?  no   Family History  Problem Relation Age of Onset  . Hyperlipidemia Father   . Hypertension Father   . Arthritis Father   . Heart disease Father   . Asthma Mother   . Diabetes Mother   . Arthritis Mother   . Heart disease Mother   . Heart disease Unknown   . Lung disease Unknown   . Diabetes Unknown   . Hypertension Unknown    Social History   Social History  . Marital status: Single    Spouse name: N/A  . Number of children: N/A  . Years of education: N/A   Social History Main Topics  . Smoking status: Never Smoker  . Smokeless tobacco: Never Used  . Alcohol use Not on file  . Drug use: Unknown  . Sexual activity: Not on file   Other  Topics Concern  . Not on file   Social History Narrative  . No narrative on file   Past Surgical History:  Procedure Laterality Date  . KNEE ARTHROSCOPY    . [place]  . ULNAR COLLATERAL LIGAMENT RECONSTRUCTION     Past Medical History:  Diagnosis Date  . Back pain   . Cervicalgia   . Chronic headaches   . Chronic pain syndrome   . Endometriosis   . Facet syndrome, lumbar ([place])   . Hyperlipidemia   . Hypertension   . Lumbar sprain   . Migraines   . Myofascial pain   . Neck pain   . TMJ syndrome    There were no vitals taken for this visit.  Opioid Risk Score:   Fall Risk Score:  `1  Depression screen PHQ 2/9  Depression screen [facility] 2/9 04/19/2015 04/10/2014  Decreased Interest 0 0  Down, Depressed, Hopeless 1 1  PHQ - 2 Score 1 1  Altered sleeping - 2  Tired, decreased energy - 2  Change in appetite - 1  Feeling bad or failure about yourself  - 0  Trouble concentrating - 0  Moving slowly or fidgety/restless - 0  Suicidal thoughts - 0  PHQ-9 Score - 6  Review of Systems  Constitutional: Positive for appetite change.  HENT: Negative.   Eyes: Negative.   Respiratory: Negative.   Cardiovascular: Negative.   Gastrointestinal: Positive for constipation, diarrhea and nausea.  Endocrine: Negative.   Genitourinary: Negative.   Musculoskeletal: Positive for joint swelling.  Skin: Positive for rash.  Allergic/Immunologic: Negative.   Neurological: Negative.   Hematological: Negative.   Psychiatric/Behavioral: Negative.   All other systems reviewed and are negative.      Objective:   Physical Exam  HENT: edentulous Head: Normocephalic and atraumatic.  Eyes: EOM are normal. Pupils are equal, round, and reactive to light.  Cardiovascular: RRR Pulmonary/Chest: CTA Abdominal: soft.  Musculoskeletal: head forward persistent.  Cervical back: mild tenderness along shoulders and cervical musculature with TP's palpable along traps  and scd's bilaterally. Facet maneuvers provoke some pain. Back:low back generally tender.   Knees with effusions and antalgia with weight bearing right more than left  Neurological: She is alert and oriented to person, place, and time. Strength 5/5. DTR's 2+/ no sensory loss. Psych: appears a bit stressed  Assessment & Plan:  ASSESSMENT:  1. History of chronic migraines with aura and cervicalgia with shoulder girdle/cervical myofascial pain.  -likely underlying cervical spondylosis as wel 2. Temporomandibular joint dysfunction.  3. 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 Back symptoms worse because of altered gait mechanics. Recent MRI performed  4. Osteoarthritis of the left knee greater than right with meniscal injury. Chondromalacia patellae.  5. Plantar fibroids.    PLAN:  1. Knees per ortho in [place] 2.Refilled lyrica today 300mg  BID  3. I refilled her Dilaudid 4 mg #12 (for severe migraines)and oxycodone 5 mg #90 today. rx's provided for next month. We will continue the opioid monitoring program, this consists of regular clinic visits, examinations, routine drug screening, pill counts as well as use of [place] Controlled Substance Reporting System. [place]CCS[place] was reviewed today.  4. Continue topamax to 100mg  qhs for migraine prophylaxis ---  4. After informed consent and preparation of the skin with isopropyl alcohol, I injected the bilateral upper traps and scd's (4 total) each with 2cc of 1% lidocaine. The patient tolerated well, and no complications were experienced. Post-injection instructions were provided.  5. Continue with HEP for neck. Needs to maintain posture.  6. I will see her back here in about 2 months.Ten minutes of face to face patient care time were spent during this visit. All questions were encouraged and answered.

## 2016-11-06 NOTE — Patient Instructions (Signed)
PLEASE FEEL FREE TO CALL OUR OFFICE WITH ANY PROBLEMS OR QUESTIONS ([phone])      

## 2016-11-08 ENCOUNTER — Telehealth: Payer: Self-pay | Admitting: *Deleted

## 2016-11-08 NOTE — Telephone Encounter (Signed)
Prior authorizations for hydromorphone and oxycodone have been approved

## 2016-11-09 ENCOUNTER — Telehealth: Payer: Self-pay | Admitting: *Deleted

## 2016-11-09 NOTE — Telephone Encounter (Signed)
Shannon Meadows  [employer]  ID# [id number]  RxBin# [id number]   RxPCN# [id number] [id number]#[id number]

## 2016-11-28 ENCOUNTER — Telehealth: Payer: Self-pay | Admitting: *Deleted

## 2016-11-28 NOTE — Telephone Encounter (Signed)
[place] Uniform Prior Authorization Form Prescription drug requests [facility] for hydromorphone 4 mg and oxycodone 5 mg.

## 2016-11-28 NOTE — Telephone Encounter (Signed)
rec'd fax back indicating that we needed to contact another division of [facility].  I contacted that number and completed prior aurthorizations over the phone for hydromorphone 4 mg and oxycodone 5 mg. Prior authorizations for both medications APPROVED.

## 2017-01-03 ENCOUNTER — Encounter
Payer: Commercial Managed Care - PPO | Attending: Physical Medicine & Rehabilitation | Admitting: Physical Medicine & Rehabilitation

## 2017-01-03 ENCOUNTER — Encounter: Payer: Self-pay | Admitting: Physical Medicine & Rehabilitation

## 2017-01-03 ENCOUNTER — Telehealth: Payer: Self-pay

## 2017-01-03 VITALS — BP 121/74 | HR 76

## 2017-01-03 DIAGNOSIS — M7918 Myalgia, other site: Secondary | ICD-10-CM | POA: Diagnosis not present

## 2017-01-03 DIAGNOSIS — Z5181 Encounter for therapeutic drug level monitoring: Secondary | ICD-10-CM | POA: Diagnosis present

## 2017-01-03 DIAGNOSIS — M47816 Spondylosis without myelopathy or radiculopathy, lumbar region: Secondary | ICD-10-CM | POA: Diagnosis not present

## 2017-01-03 DIAGNOSIS — Z79899 Other long term (current) drug therapy: Secondary | ICD-10-CM | POA: Insufficient documentation

## 2017-01-03 DIAGNOSIS — M170 Bilateral primary osteoarthritis of knee: Secondary | ICD-10-CM

## 2017-01-03 DIAGNOSIS — M26609 Unspecified temporomandibular joint disorder, unspecified side: Secondary | ICD-10-CM | POA: Diagnosis present

## 2017-01-03 DIAGNOSIS — G43109 Migraine with aura, not intractable, without status migrainosus: Secondary | ICD-10-CM | POA: Diagnosis present

## 2017-01-03 DIAGNOSIS — M797 Fibromyalgia: Secondary | ICD-10-CM | POA: Diagnosis present

## 2017-01-03 MED ORDER — OXYCODONE HCL 5 MG PO TABS: 5.0000 mg | ORAL_TABLET | Freq: Three times a day (TID) | ORAL | 0 refills | Status: DC | PRN

## 2017-01-03 MED ORDER — HYDROMORPHONE HCL 4 MG PO TABS: 4.0000 mg | ORAL_TABLET | ORAL | 0 refills | Status: DC | PRN

## 2017-01-03 NOTE — Patient Instructions (Signed)
PLEASE FEEL FREE TO CALL OUR OFFICE WITH ANY PROBLEMS OR QUESTIONS ([phone])  HAVE A HAPPY HOLIDAYS!                     ^                  ^^                ^ ^ ^             ^ ^ ^ ^ ^           ^ ^ ^ ^ ^ ^ ^        ^ ^ ^ ^ ^ ^ ^ ^ ^      ^ ^ ^ ^ ^ ^ ^ ^ ^ ^ ^                ^^^^                ^^^^                ^^^^     

## 2017-01-03 NOTE — Progress Notes (Signed)
Subjective:    Patient ID: Shannon Meadows, female    DOB: [birth year], 46 y.o.   MRN: [id number]  HPI   Laiya is here for follow-up of her chronic pain.  She states that her pain levels have been generally stable.  She is having increased neck and shoulder pain over the last few weeks.  She took a new job in [place] and is getting accustomed to the job in the area.  She still is dealing with a lot of issues regarding her father's estate and things have been unsettled there as well.  She remains on oxycodone 5 mg every 8 hours as needed for pain control as well as Dilaudid 4 mg daily as needed for more severe pain/headaches.  She states her bowels and bladder are moving without issues.  Yaremi has noticed some increased pain in her TMJ areas with associated popping and grinding.  Pain Inventory Average Pain 5 Pain Right Now 6 My pain is intermittent, constant, sharp, burning, dull, stabbing, tingling and aching  In the last 24 hours, has pain interfered with the following? General activity 5 Relation with others 4 Enjoyment of life 4 What TIME of day is your pain at its worst? daytime Sleep (in general) Fair  Pain is worse with: inactivity and some activites Pain improves with: rest, heat/ice, pacing activities, medication, TENS and injections Relief from Meds: 5  Mobility walk without assistance ability to climb steps?  yes do you drive?  yes  Function employed # of hrs/week 40  Neuro/Psych dizziness depression  Prior Studies Any changes since last visit?  no  Physicians involved in your care Any changes since last visit?  no   Family History  Problem Relation Age of Onset  . Hyperlipidemia Father   . Hypertension Father   . Arthritis Father   . Heart disease Father   . Asthma Mother   . Diabetes Mother   . Arthritis Mother   . Heart disease Mother   . Heart disease Unknown   . Lung disease Unknown   . Diabetes Unknown   . Hypertension Unknown    Social  History   Socioeconomic History  . Marital status: Single    Spouse name: None  . Number of children: None  . Years of education: None  . Highest education level: None  Social Needs  . Financial resource strain: None  . Food insecurity - worry: None  . Food insecurity - inability: None  . Transportation needs - medical: None  . Transportation needs - non-medical: None  Occupational History  . None  Tobacco Use  . Smoking status: Never Smoker  . Smokeless tobacco: Never Used  Substance and Sexual Activity  . Alcohol use: None  . Drug use: None  . Sexual activity: None  Other Topics Concern  . None  Social History Narrative  . None   Past Surgical History:  Procedure Laterality Date  . KNEE ARTHROSCOPY    . [place]  . ULNAR COLLATERAL LIGAMENT RECONSTRUCTION     Past Medical History:  Diagnosis Date  . Back pain   . Cervicalgia   . Chronic headaches   . Chronic pain syndrome   . Endometriosis   . Facet syndrome, lumbar   . Hyperlipidemia   . Hypertension   . Lumbar sprain   . Migraines   . Myofascial pain   . Neck pain   . TMJ syndrome    BP 121/74  Pulse 76   SpO2 96%   Opioid Risk Score:   Fall Risk Score:  `1  Depression screen PHQ 2/9  Depression screen [facility] 2/9 04/19/2015 04/10/2014  Decreased Interest 0 0  Down, Depressed, Hopeless 1 1  PHQ - 2 Score 1 1  Altered sleeping - 2  Tired, decreased energy - 2  Change in appetite - 1  Feeling bad or failure about yourself  - 0  Trouble concentrating - 0  Moving slowly or fidgety/restless - 0  Suicidal thoughts - 0  PHQ-9 Score - 6     Review of Systems  Constitutional: Positive for unexpected weight change.  HENT: Negative.   Eyes: Negative.   Respiratory: Negative.   Cardiovascular: Negative.   Gastrointestinal: Positive for diarrhea and nausea.  Endocrine: Negative.   Genitourinary: Negative.   Musculoskeletal: Negative.   Skin: Negative.     Allergic/Immunologic: Negative.   Neurological: Negative.   Hematological: Negative.   Psychiatric/Behavioral: Negative.   All other systems reviewed and are negative.      Objective:   Physical Exam  HENT: edentulous Head: Normocephalic and atraumatic.  Eyes: EOM are normal. Pupils are equal, round, and reactive to light.  Cardiovascular: RRR Pulmonary/Chest: CTA Abdominal: soft.  Musculoskeletal: head forward persistent.  Cervical back: mild tenderness along shoulders and cervical musculature with TP's palpable along left trap and bilateral scm's bilaterally. facet man positive. Back:low back generally tender. Knees with effusions and antalgia with weight bearing right more than left  Neurological: She is alert and oriented to person, place, and time. Strength 5/5. DTR's 2+/ no sensory loss. Psych: appears a bit stressed  Assessment & Plan:  ASSESSMENT:  1. History of chronic migraines with aura and cervicalgia with shoulder girdle/cervical myofascial pain.  -likely underlying cervical spondylosis as wel 2. Temporomandibular joint dysfunction.  3. 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 Back symptoms worse because of altered gait mechanics. Recent MRI performed  4. Osteoarthritis of the left knee greater than right with meniscal injury. Chondromalacia patellae.  5. Plantar fibroids.    PLAN:  1. Knees per ortho (no current established follow up however) 2.Refilled lyrica today 300mg  BID  3. I refilled her Dilaudid 4 mg #12 (for severe pain)and oxycodone 5 mg #90 today. rx'es provided for next month. We will continue the opioid monitoring program, this consists of regular clinic visits, examinations, routine drug screening, pill counts as well as use of [place] Controlled Substance Reporting System. NCCS[place] was reviewed today.   4. Continue topamax to 100mg  qhs for migraine prophylaxis ---  4. After informed consent and  preparation of the skin with isopropyl alcohol, I injected the left SCM/right SCM/left upper trap with 2cc of 1% lidocaine. The patient tolerated well, and no complications were experienced. Post-injection instructions were provided.   5. Continue with HEP for neck. Needs to maintain posture.  6. I will see her back here in about 2 months.Tenminutes of face to face patient care time were spent during this visit. All questions were encouraged and answered.

## 2017-01-03 NOTE — Telephone Encounter (Signed)
Patient called today, states now has new insurannce and therefore needs prior [place] for both medications, oxycodone and hydromorphone.  Prior auth started today 01-03-2017

## 2017-01-03 NOTE — Telephone Encounter (Signed)
Patient has new insurance which was filed with clinic but no card was scanned, called patient and found out that her info is:  ID: [id number] [place]:  [id number] PCN:  [id number] [place]:  [id number]

## 2017-01-04 NOTE — Telephone Encounter (Signed)
Both prior authorizations have returned today (01-04-2017) as being approved, patient called, no answer, left message informing her of approvals

## 2017-02-01 ENCOUNTER — Telehealth: Payer: Self-pay | Admitting: *Deleted

## 2017-02-01 DIAGNOSIS — M170 Bilateral primary osteoarthritis of knee: Secondary | ICD-10-CM

## 2017-02-01 DIAGNOSIS — M47816 Spondylosis without myelopathy or radiculopathy, lumbar region: Secondary | ICD-10-CM

## 2017-02-01 NOTE — Telephone Encounter (Signed)
Patient called Shannon Meadows left a message stating that she saw Dr. Naaman Plummer on 01/03/2017 and was given a 2nd set of scripts for the month of Jan 2019.  She has misplaced those scripts and is unable to pick up her medications which will run out tomorrow.  She is very sorry for the inconvenience.  She says normally she is very careful about keeping track of her second set scripts.  She is hoping Dr. Naaman Plummer can provide her with replacements.  She will be in [place] on Monday and would be able to come by to pick up the scripts...Marland KitchenMarland KitchenPlease advise

## 2017-02-02 NOTE — Telephone Encounter (Signed)
I will be willing to replace her scripts this time.  However we need to document that we will not do so again.  I believe this may be the second time this has happened

## 2017-02-05 ENCOUNTER — Telehealth: Payer: Self-pay

## 2017-02-05 MED ORDER — OXYCODONE HCL 5 MG PO TABS: 5.0000 mg | ORAL_TABLET | Freq: Three times a day (TID) | ORAL | 0 refills | Status: DC | PRN

## 2017-02-05 MED ORDER — HYDROMORPHONE HCL 4 MG PO TABS: 4.0000 mg | ORAL_TABLET | ORAL | 0 refills | Status: DC | PRN

## 2017-02-05 NOTE — Progress Notes (Signed)
Warning: Dr. Naaman Plummer has issued a final warning stating that if the patient loses her paper scripts again, they will not be replaced.  He agreed to do it one last time on 02/05/2017.

## 2017-02-05 NOTE — Telephone Encounter (Signed)
A warning was added to her chart as a blank note addendum to her last visit on 01/03/2017.  Scripts have been printed and placed on Dr. Charm Barges desk to sign.  Patient notified.

## 2017-02-05 NOTE — Telephone Encounter (Signed)
error 

## 2017-02-06 ENCOUNTER — Other Ambulatory Visit: Payer: Self-pay | Admitting: Physical Medicine & Rehabilitation

## 2017-02-06 DIAGNOSIS — G894 Chronic pain syndrome: Secondary | ICD-10-CM

## 2017-02-06 DIAGNOSIS — Z5181 Encounter for therapeutic drug level monitoring: Secondary | ICD-10-CM

## 2017-02-06 DIAGNOSIS — Z79899 Other long term (current) drug therapy: Secondary | ICD-10-CM

## 2017-02-06 DIAGNOSIS — M26609 Unspecified temporomandibular joint disorder, unspecified side: Secondary | ICD-10-CM

## 2017-02-06 DIAGNOSIS — M1712 Unilateral primary osteoarthritis, left knee: Secondary | ICD-10-CM

## 2017-02-06 DIAGNOSIS — M797 Fibromyalgia: Secondary | ICD-10-CM

## 2017-02-06 DIAGNOSIS — G43109 Migraine with aura, not intractable, without status migrainosus: Secondary | ICD-10-CM

## 2017-03-05 ENCOUNTER — Encounter
Payer: Commercial Managed Care - PPO | Attending: Physical Medicine & Rehabilitation | Admitting: Physical Medicine & Rehabilitation

## 2017-03-05 ENCOUNTER — Encounter: Payer: Self-pay | Admitting: Physical Medicine & Rehabilitation

## 2017-03-05 VITALS — BP 143/73 | HR 64

## 2017-03-05 DIAGNOSIS — G43109 Migraine with aura, not intractable, without status migrainosus: Secondary | ICD-10-CM | POA: Insufficient documentation

## 2017-03-05 DIAGNOSIS — Z79899 Other long term (current) drug therapy: Secondary | ICD-10-CM | POA: Diagnosis present

## 2017-03-05 DIAGNOSIS — Z5181 Encounter for therapeutic drug level monitoring: Secondary | ICD-10-CM | POA: Diagnosis present

## 2017-03-05 DIAGNOSIS — M1712 Unilateral primary osteoarthritis, left knee: Secondary | ICD-10-CM | POA: Diagnosis not present

## 2017-03-05 DIAGNOSIS — M26609 Unspecified temporomandibular joint disorder, unspecified side: Secondary | ICD-10-CM | POA: Insufficient documentation

## 2017-03-05 DIAGNOSIS — M7918 Myalgia, other site: Secondary | ICD-10-CM | POA: Diagnosis not present

## 2017-03-05 DIAGNOSIS — M797 Fibromyalgia: Secondary | ICD-10-CM | POA: Insufficient documentation

## 2017-03-05 DIAGNOSIS — M170 Bilateral primary osteoarthritis of knee: Secondary | ICD-10-CM | POA: Diagnosis present

## 2017-03-05 DIAGNOSIS — M47816 Spondylosis without myelopathy or radiculopathy, lumbar region: Secondary | ICD-10-CM | POA: Diagnosis not present

## 2017-03-05 MED ORDER — TOPIRAMATE 50 MG PO TABS: 50.0000 mg | ORAL_TABLET | Freq: Three times a day (TID) | ORAL | 5 refills | Status: DC

## 2017-03-05 MED ORDER — OXYCODONE HCL 5 MG PO TABS: 5.0000 mg | ORAL_TABLET | Freq: Three times a day (TID) | ORAL | 0 refills | Status: DC | PRN

## 2017-03-05 MED ORDER — ERENUMAB-AOOE 70 MG/ML ~~LOC~~ SOAJ: 70.0000 mg | SUBCUTANEOUS | 3 refills | Status: DC

## 2017-03-05 MED ORDER — HYDROMORPHONE HCL 4 MG PO TABS: 4.0000 mg | ORAL_TABLET | ORAL | 0 refills | Status: DC | PRN

## 2017-03-05 NOTE — Patient Instructions (Signed)
PLEASE FEEL FREE TO CALL OUR OFFICE WITH ANY PROBLEMS OR QUESTIONS ([phone])      

## 2017-03-05 NOTE — Progress Notes (Signed)
Subjective:    Patient ID: Shannon Meadows, female    DOB: [birth year], 47 y.o.   MRN: [id number]  HPI  Shannon Meadows is here in follow-up of her chronic pain.  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  She is hoping to potentially moved to the [facility] area after she spends several weeks and she will be [place].  She is having worsening migraine headaches over the last couple months.  Trigger point injections helped somewhat with her neck pain and to a small extent of headaches, but nothing she is doing right now seems to be penetrating migraines.  She is having at least 15-16 severe headaches each month and over 20 headache days each month.  She is on Topamax 100 mg at nighttime and uses Imitrex for breakthrough headaches as well as her Dilaudid.  She is using oxycodone every 8 hours as needed during the day and is also on Lyrica and Zanaflex.  Pain Inventory Average Pain 7 Pain Right Now 7 My pain is intermittent, dull, stabbing, tingling and aching  In the last 24 hours, has pain interfered with the following? General activity 9 Relation with others 9 Enjoyment of life 9 What TIME of day is your pain at its worst? daytime Sleep (in general) Poor  Pain is worse with: walking, bending, inactivity, standing and some activites Pain improves with: rest, pacing activities, medication and injections Relief from Meds: 6  Mobility walk without assistance ability to climb steps?  yes do you drive?  yes  Function employed # of hrs/week 36  Neuro/Psych dizziness depression  Prior Studies Any changes since last visit?  no  Physicians involved in your care Any changes since last visit?  no   Family History  Problem Relation Age of Onset  . Hyperlipidemia Father   . Hypertension Father   . Arthritis Father   . Heart disease Father   . Asthma Mother   . Diabetes Mother   . Arthritis Mother   . Heart disease Mother   . Heart disease Unknown    . Lung disease Unknown   . Diabetes Unknown   . Hypertension Unknown    Social History   Socioeconomic History  . Marital status: Single    Spouse name: Not on file  . Number of children: Not on file  . Years of education: Not on file  . Highest education level: Not on file  Social Needs  . Financial resource strain: Not on file  . Food insecurity - worry: Not on file  . Food insecurity - inability: Not on file  . Transportation needs - medical: Not on file  . Transportation needs - non-medical: Not on file  Occupational History  . Not on file  Tobacco Use  . Smoking status: Never Smoker  . Smokeless tobacco: Never Used  Substance and Sexual Activity  . Alcohol use: Not on file  . Drug use: Not on file  . Sexual activity: Not on file  Other Topics Concern  . Not on file  Social History Narrative  . Not on file   Past Surgical History:  Procedure Laterality Date  . KNEE ARTHROSCOPY    . [name]  . ULNAR COLLATERAL LIGAMENT RECONSTRUCTION     Past Medical History:  Diagnosis Date  . Back pain   . Cervicalgia   . Chronic headaches   . Chronic pain syndrome   . Endometriosis   .  Facet syndrome, lumbar   . Hyperlipidemia   . Hypertension   . Lumbar sprain   . Migraines   . Myofascial pain   . Neck pain   . TMJ syndrome    There were no vitals taken for this visit.  Opioid Risk Score:   Fall Risk Score:  `1  Depression screen PHQ 2/9  Depression screen [facility] 2/9 04/19/2015 04/10/2014  Decreased Interest 0 0  Down, Depressed, Hopeless 1 1  PHQ - 2 Score 1 1  Altered sleeping - 2  Tired, decreased energy - 2  Change in appetite - 1  Feeling bad or failure about yourself  - 0  Trouble concentrating - 0  Moving slowly or fidgety/restless - 0  Suicidal thoughts - 0  PHQ-9 Score - 6     Review of Systems  Constitutional: Positive for appetite change and unexpected weight change.  HENT: Negative.   Eyes: Negative.     Respiratory: Negative.   Cardiovascular: Negative.   Gastrointestinal: Positive for nausea and vomiting.  Endocrine: Negative.   Genitourinary: Negative.   Musculoskeletal: Negative.   Skin: Negative.   Allergic/Immunologic: Negative.   Neurological: Negative.   Hematological: Negative.   Psychiatric/Behavioral: Negative.   All other systems reviewed and are negative.      Objective:   Physical Exam HENT: edentulousremains morbidly obese Head: Normocephalic and atraumatic.  Eyes: EOM are normal. Pupils are equal, round, and reactive to light.  Cardiovascular:Regular rate Pulmonary/Chest:Normal effort Abdominal:soft.  Musculoskeletal: head forward persistent.  Cervical back: Ongoing tenderness along both shoulders and traps into the splenius capitis muscles bilaterally.  No specific trigger points were appreciated today although the area in general is tenderBack:low back generally tender. Knees with effusions and antalgia with weight bearing right more than left  Neurological: She is alert and oriented to person, place, and time. Strength strength and sensory function remain normal  psych: appears fatigued  Assessment & Plan:  ASSESSMENT:  1. History of chronic migraines with aura and cervicalgia with shoulder girdle/cervical myofascial pain.  -likely underlying cervical spondylosis as well      -Worsening headaches over the last few months despite rescue and prophylactic treatment 2. Temporomandibular joint dysfunction.  3. 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 Back symptoms worse because of altered gait mechanics. Recent MRI performed  4. Osteoarthritis of the left knee greater than right with meniscal injury. Chondromalacia patellae.  5. Plantar fibroids.    PLAN:  1. Knees per ortho (????no current established follow up however) 2.continue Lyrica today 300mg  BID  3. I refilled her Dilaudid 4 mg #12(for more severe  pain)and oxycodone 5 mg #90today. rx'es provided for next month.We will continue the opioid monitoring program, this consists of regular clinic visits, examinations, routine drug screening, pill counts as well as use of [place] Controlled Substance Reporting System. [place]CC[place] was reviewed today.   4. Change topamax to 100mg  qpm and 50mg  qam 5. Continue with HEP for neck. Needs to maintain posture.  Stressed the importance of this once again today 6. Aimovig trial---prior auth being saught    I will see her back here in about35months.15 min of face to face patient care time were spent during this visit. All questions were encouraged and answered.

## 2017-03-14 ENCOUNTER — Telehealth: Payer: Self-pay | Admitting: *Deleted

## 2017-03-14 NOTE — Telephone Encounter (Signed)
Prior Authorization for Aimovig was submitted and approved by [facility]

## 2017-04-02 ENCOUNTER — Telehealth: Payer: Self-pay | Admitting: *Deleted

## 2017-04-02 DIAGNOSIS — M170 Bilateral primary osteoarthritis of knee: Secondary | ICD-10-CM

## 2017-04-02 DIAGNOSIS — M47816 Spondylosis without myelopathy or radiculopathy, lumbar region: Secondary | ICD-10-CM

## 2017-04-02 MED ORDER — PREGABALIN 300 MG PO CAPS: 300.0000 mg | ORAL_CAPSULE | Freq: Two times a day (BID) | ORAL | 5 refills | Status: DC

## 2017-04-02 NOTE — Telephone Encounter (Signed)
Shannon Meadows needs [place] to cal and verify her narcotic rxs with her pharmacy [facility] [place] and she needs refill on her lyrica.  Both were done.

## 2017-04-22 ENCOUNTER — Other Ambulatory Visit: Payer: Self-pay | Admitting: Physical Medicine & Rehabilitation

## 2017-04-22 DIAGNOSIS — M797 Fibromyalgia: Secondary | ICD-10-CM

## 2017-04-22 DIAGNOSIS — G894 Chronic pain syndrome: Secondary | ICD-10-CM

## 2017-04-22 DIAGNOSIS — M1712 Unilateral primary osteoarthritis, left knee: Secondary | ICD-10-CM

## 2017-04-22 DIAGNOSIS — G43109 Migraine with aura, not intractable, without status migrainosus: Secondary | ICD-10-CM

## 2017-04-22 DIAGNOSIS — M26609 Unspecified temporomandibular joint disorder, unspecified side: Secondary | ICD-10-CM

## 2017-04-22 DIAGNOSIS — Z5181 Encounter for therapeutic drug level monitoring: Secondary | ICD-10-CM

## 2017-04-22 DIAGNOSIS — Z79899 Other long term (current) drug therapy: Secondary | ICD-10-CM

## 2017-04-23 ENCOUNTER — Encounter
Payer: Commercial Managed Care - PPO | Attending: Physical Medicine & Rehabilitation | Admitting: Physical Medicine & Rehabilitation

## 2017-04-23 ENCOUNTER — Telehealth: Payer: Self-pay | Admitting: *Deleted

## 2017-04-23 ENCOUNTER — Encounter: Payer: Self-pay | Admitting: Physical Medicine & Rehabilitation

## 2017-04-23 DIAGNOSIS — M26609 Unspecified temporomandibular joint disorder, unspecified side: Secondary | ICD-10-CM | POA: Diagnosis not present

## 2017-04-23 DIAGNOSIS — Z5181 Encounter for therapeutic drug level monitoring: Secondary | ICD-10-CM | POA: Diagnosis present

## 2017-04-23 DIAGNOSIS — G43109 Migraine with aura, not intractable, without status migrainosus: Secondary | ICD-10-CM | POA: Diagnosis present

## 2017-04-23 DIAGNOSIS — M170 Bilateral primary osteoarthritis of knee: Secondary | ICD-10-CM | POA: Diagnosis present

## 2017-04-23 DIAGNOSIS — M1712 Unilateral primary osteoarthritis, left knee: Secondary | ICD-10-CM

## 2017-04-23 DIAGNOSIS — Z79899 Other long term (current) drug therapy: Secondary | ICD-10-CM | POA: Diagnosis present

## 2017-04-23 DIAGNOSIS — M7918 Myalgia, other site: Secondary | ICD-10-CM | POA: Diagnosis not present

## 2017-04-23 DIAGNOSIS — M797 Fibromyalgia: Secondary | ICD-10-CM | POA: Diagnosis present

## 2017-04-23 DIAGNOSIS — M47816 Spondylosis without myelopathy or radiculopathy, lumbar region: Secondary | ICD-10-CM

## 2017-04-23 DIAGNOSIS — G894 Chronic pain syndrome: Secondary | ICD-10-CM

## 2017-04-23 MED ORDER — OXYCODONE HCL 5 MG PO TABS: 5.0000 mg | ORAL_TABLET | Freq: Three times a day (TID) | ORAL | 0 refills | Status: DC | PRN

## 2017-04-23 MED ORDER — TIZANIDINE HCL 2 MG PO TABS: 2.0000 mg | ORAL_TABLET | Freq: Three times a day (TID) | ORAL | 6 refills | Status: DC | PRN

## 2017-04-23 MED ORDER — HYDROMORPHONE HCL 4 MG PO TABS: 4.0000 mg | ORAL_TABLET | ORAL | 0 refills | Status: DC | PRN

## 2017-04-23 NOTE — Patient Instructions (Signed)
PLEASE FEEL FREE TO CALL OUR OFFICE WITH ANY PROBLEMS OR QUESTIONS ([phone])      

## 2017-04-23 NOTE — Telephone Encounter (Signed)
Shannon Meadows called back to office after visit because she went to fill her medication at local pharmacy from [facility] and they had no problem filling the dilaudid (last fill date 04/02/17) but they could not fill the oxycodone because it too was filled 04/02/17 and would not be due to be filled until 05/01/17.  Shannon Meadows is wanting it ok'd to fill today but she is driving back to [facility] so [facility] [place] [facility] [place] [facility]  I spoke with Dr Naaman Plummer and he said NO.  Vella notified.

## 2017-04-23 NOTE — Progress Notes (Signed)
Subjective:    Patient ID: Shannon Meadows, female    DOB: January 25, 1970, 47 y.o.   MRN: [id number]  HPI   Shannon Meadows is here in follow-up of her chronic pain. She experienced at least a 33% drop in her headache severity with the aimovig. She just took her second injection this weekend.   She remains on oxycodone for her breakthrough pain and dilaudid for more severe pain/headaches. Her dilaudid lasted longer this past month then prior months.  She reports some increasing pain in her lower neck.  Has been sometime since we performed trigger point injections.  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  She is hoping to be moving closer to the [place] area soon.   Pain Inventory Average Pain 6 Pain Right Now 6 My pain is intermittent, constant, sharp, burning, dull, stabbing, tingling and aching  In the last 24 hours, has pain interfered with the following? General activity 5 Relation with others 5 Enjoyment of life 5 What TIME of day is your pain at its worst? morning and day Sleep (in general) Fair  Pain is worse with: walking, bending, sitting, inactivity, standing and some activites Pain improves with: rest, pacing activities, medication and injections Relief from Meds: 6  Mobility walk without assistance ability to climb steps?  yes do you drive?  yes  Function employed # of hrs/week 36  Neuro/Psych dizziness  Prior Studies Any changes since last visit?  no  Physicians involved in your care Any changes since last visit?  no   Family History  Problem Relation Age of Onset  . Hyperlipidemia Father   . Hypertension Father   . Arthritis Father   . Heart disease Father   . Asthma Mother   . Diabetes Mother   . Arthritis Mother   . Heart disease Mother   . Heart disease Unknown   . Lung disease Unknown   . Diabetes Unknown   . Hypertension Unknown    Social History    Socioeconomic History  . Marital status: Single    Spouse name: Not on file  . Number of children: Not on file  . Years of education: Not on file  . Highest education level: Not on file  Occupational History  . Not on file  Social Needs  . Financial resource strain: Not on file  . Food insecurity:    Worry: Not on file    Inability: Not on file  . Transportation needs:    Medical: Not on file    Non-medical: Not on file  Tobacco Use  . Smoking status: Never Smoker  . Smokeless tobacco: Never Used  Substance and Sexual Activity  . Alcohol use: Not on file  . Drug use: Not on file  . Sexual activity: Not on file  Lifestyle  . Physical activity:    Days per week: Not on file    Minutes per session: Not on file  . Stress: Not on file  Relationships  . Social connections:    Talks on phone: Not on file    Gets together: Not on file    Attends religious service: Not on file    Active member of club or organization: Not on file    Attends meetings of clubs or organizations: Not on file    Relationship status: Not on file  Other Topics Concern  . Not on file  Social History Narrative  .  Not on file   Past Surgical History:  Procedure Laterality Date  . KNEE ARTHROSCOPY    . [place]  . ULNAR COLLATERAL LIGAMENT RECONSTRUCTION     Past Medical History:  Diagnosis Date  . Back pain   . Cervicalgia   . Chronic headaches   . Chronic pain syndrome   . Endometriosis   . Facet syndrome, lumbar   . Hyperlipidemia   . Hypertension   . Lumbar sprain   . Migraines   . Myofascial pain   . Neck pain   . TMJ syndrome    There were no vitals taken for this visit.  Opioid Risk Score:   Fall Risk Score:  `1  Depression screen PHQ 2/9  Depression screen [facility] 2/9 04/19/2015 04/10/2014  Decreased Interest 0 0  Down, Depressed, Hopeless 1 1  PHQ - 2 Score 1 1  Altered sleeping - 2  Tired, decreased energy - 2  Change in appetite - 1  Feeling  bad or failure about yourself  - 0  Trouble concentrating - 0  Moving slowly or fidgety/restless - 0  Suicidal thoughts - 0  PHQ-9 Score - 6     Review of Systems  Constitutional: Negative.   HENT: Negative.   Eyes: Negative.   Respiratory: Negative.   Cardiovascular: Negative.   Gastrointestinal: Negative.   Endocrine: Negative.   Genitourinary: Negative.   Musculoskeletal: Positive for arthralgias, back pain, joint swelling, myalgias and neck pain.  Skin: Negative.   Allergic/Immunologic: Negative.   Neurological: Positive for dizziness.  Hematological: Negative.   Psychiatric/Behavioral: Negative.   All other systems reviewed and are negative.      Objective:   Physical Exam General: No acute distress HEENT: EOMI, oral membranes moist Cards: reg rate  Chest: normal effort Abdomen: Soft, NT, ND Skin: dry, intact Extremities: no edema  Musculoskeletal: head forward persistent.  Cervical back:  Trigger points in bilateral mid traps as well as upper/posterior sternocleidomastoid muscles bilaterally.  Neck range of motion was affected somewhat by the tenderness and spasm.  Low back generally tender to palpation.  She remains antalgic on both knees.. Knees with effusions   Neurological: She is alert and oriented to person, place, and time. Strength strength and sensory function remain normal  psych: appears fatigued  Assessment & Plan:  ASSESSMENT:  1. History of chronic migraines with aura and cervicalgia with shoulder girdle/cervical myofascial pain.  -likely underlying cervical spondylosis as well 2. Temporomandibular joint dysfunction.  3. 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 Back symptoms worse because of altered gait mechanics. Recent MRI performed  4. Osteoarthritis of the left knee greater than right with meniscal injury. Chondromalacia patellae.  5. Plantar fibroids.    PLAN:  1. After informed consent and  preparation of the skin with isopropyl alcohol, I injected the bilateral SCM's and traps each (4 total) with 2cc of 1% lidocaine. The patient tolerated well, and no complications were experienced. Post-injection instructions were provided. 2.continue Lyrica today 300mg  BID  3. I refilled her Dilaudid 4 mg #12(for more severe pain/headaches)and oxycodone 5 mg #90today.rx'es were provided for next month. We will continue the opioid monitoring program, this consists of regular clinic visits, examinations, routine drug screening, pill counts as well as use of [place] Controlled Substance Reporting System. [place] was reviewed today.   -a goal will be to wean her dilaudid down over the course of this year  given her  success with aimovig.  4. Topamax 100mg  qpm and 50mg  qam 5. Continue with HEP for neck. Needs to maintain posture.   6. Aimovig trial---improvement by 33% over 6-8 weeks so far..     I will see her back here in about14months.15 min of face to face patient care time were spent during this visit. All questions were encouraged and answered.

## 2017-05-01 ENCOUNTER — Telehealth: Payer: Self-pay | Admitting: *Deleted

## 2017-05-01 NOTE — Telephone Encounter (Signed)
Shannon Meadows called about her oxycodone needing authorization to be filled. [facility] [place] to give [facility] but in the meantime [facility] from [place].  I called Shannon Meadows back and she says she is working there right now.  [facility] [place] [facility] by Dr Naaman Plummer, and I questioned her about it.  She says she will be back there next month.  I called [facility] and cancelled the ok to fill THIS MONTH but let them know they do have the next month on file.  I called [facility] and spoke to the pharmacist and gave approval to fill today.

## 2017-06-25 ENCOUNTER — Encounter: Payer: Self-pay | Admitting: Physical Medicine & Rehabilitation

## 2017-06-25 ENCOUNTER — Encounter
Payer: Commercial Managed Care - PPO | Attending: Physical Medicine & Rehabilitation | Admitting: Physical Medicine & Rehabilitation

## 2017-06-25 VITALS — BP 120/79 | HR 70 | Ht 69.0 in | Wt 330.0 lb

## 2017-06-25 DIAGNOSIS — M797 Fibromyalgia: Secondary | ICD-10-CM | POA: Diagnosis present

## 2017-06-25 DIAGNOSIS — M7918 Myalgia, other site: Secondary | ICD-10-CM | POA: Diagnosis not present

## 2017-06-25 DIAGNOSIS — Z5181 Encounter for therapeutic drug level monitoring: Secondary | ICD-10-CM

## 2017-06-25 DIAGNOSIS — Z79899 Other long term (current) drug therapy: Secondary | ICD-10-CM | POA: Diagnosis present

## 2017-06-25 DIAGNOSIS — M26609 Unspecified temporomandibular joint disorder, unspecified side: Secondary | ICD-10-CM | POA: Insufficient documentation

## 2017-06-25 DIAGNOSIS — M170 Bilateral primary osteoarthritis of knee: Secondary | ICD-10-CM

## 2017-06-25 DIAGNOSIS — M47816 Spondylosis without myelopathy or radiculopathy, lumbar region: Secondary | ICD-10-CM | POA: Diagnosis not present

## 2017-06-25 DIAGNOSIS — G43109 Migraine with aura, not intractable, without status migrainosus: Secondary | ICD-10-CM | POA: Diagnosis present

## 2017-06-25 MED ORDER — OXYCODONE HCL 5 MG PO TABS: 5.0000 mg | ORAL_TABLET | Freq: Three times a day (TID) | ORAL | 0 refills | Status: DC | PRN

## 2017-06-25 MED ORDER — ERENUMAB-AOOE 70 MG/ML ~~LOC~~ SOAJ: 70.0000 mg | SUBCUTANEOUS | 3 refills | Status: DC

## 2017-06-25 MED ORDER — HYDROMORPHONE HCL 4 MG PO TABS: 4.0000 mg | ORAL_TABLET | ORAL | 0 refills | Status: DC | PRN

## 2017-06-25 NOTE — Progress Notes (Signed)
Subjective:    Patient ID: Shannon Meadows, female    DOB: [birth year], 47 y.o.   MRN: [id number]  HPI   [name] is here in follow up of her chronic pain. She is now working in [place], [place] with plans to move there in the near future. She says she likes where she is. She just came off a 12 hour shift and drove up here. She is now living with a boyfriend who has health issues and she has had to help provide care for him now.    She remains on dilaudid and oxycodone for pain control.   Aimovig has continued to prove effective for her migraine control. It is most effective up until 3 days before her shot is due. Otherwise she states her migraines have drop by 1/3rd.  She remains on the 70mg  dose.            Pain Inventory Average Pain 6 Pain Right Now 5 My pain is intermittent, constant, sharp, burning, dull, stabbing, tingling and aching  In the last 24 hours, has pain interfered with the following? General activity 5 Relation with others 7 Enjoyment of life 7 What TIME of day is your pain at its worst? morning Sleep (in general) Poor  Pain is worse with: walking, bending, sitting, standing, unsure and some activites Pain improves with: rest, heat/ice, pacing activities, medication and injections Relief from Meds: 5  Mobility walk without assistance ability to climb steps?  yes do you drive?  yes  Function employed # of hrs/week 36+  Neuro/Psych dizziness  Prior Studies Any changes since last visit?  no  Physicians involved in your care Any changes since last visit?  no   Family History  Problem Relation Age of Onset  . Hyperlipidemia Father   . Hypertension Father   . Arthritis Father   . Heart disease Father   . Asthma Mother   . Diabetes Mother   . Arthritis Mother   . Heart disease Mother   . Heart disease Unknown   . Lung disease Unknown   . Diabetes Unknown   . Hypertension Unknown    Social History   Socioeconomic History  . Marital status:  Single    Spouse name: Not on file  . Number of children: Not on file  . Years of education: Not on file  . Highest education level: Not on file  Occupational History  . Not on file  Social Needs  . Financial resource strain: Not on file  . Food insecurity:    Worry: Not on file    Inability: Not on file  . Transportation needs:    Medical: Not on file    Non-medical: Not on file  Tobacco Use  . Smoking status: Never Smoker  . Smokeless tobacco: Never Used  Substance and Sexual Activity  . Alcohol use: Not on file  . Drug use: Not on file  . Sexual activity: Not on file  Lifestyle  . Physical activity:    Days per week: Not on file    Minutes per session: Not on file  . Stress: Not on file  Relationships  . Social connections:    Talks on phone: Not on file    Gets together: Not on file    Attends religious service: Not on file    Active member of club or organization: Not on file    Attends meetings of clubs or organizations: Not on file    Relationship status: Not  on file  Other Topics Concern  . Not on file  Social History Narrative  . Not on file   Past Surgical History:  Procedure Laterality Date  . KNEE ARTHROSCOPY    . [place]  . ULNAR COLLATERAL LIGAMENT RECONSTRUCTION     Past Medical History:  Diagnosis Date  . Back pain   . Cervicalgia   . Chronic headaches   . Chronic pain syndrome   . Endometriosis   . Facet syndrome, lumbar   . Hyperlipidemia   . Hypertension   . Lumbar sprain   . Migraines   . Myofascial pain   . Neck pain   . TMJ syndrome    There were no vitals taken for this visit.  Opioid Risk Score:   Fall Risk Score:  `1  Depression screen PHQ 2/9  Depression screen [facility] 2/9 04/19/2015 04/10/2014  Decreased Interest 0 0  Down, Depressed, Hopeless 1 1  PHQ - 2 Score 1 1  Altered sleeping - 2  Tired, decreased energy - 2  Change in appetite - 1  Feeling bad or failure about yourself  - 0  Trouble  concentrating - 0  Moving slowly or fidgety/restless - 0  Suicidal thoughts - 0  PHQ-9 Score - 6     Review of Systems  Constitutional: Negative.   HENT: Negative.   Eyes: Negative.   Respiratory: Negative.   Cardiovascular: Negative.   Gastrointestinal: Negative.   Endocrine: Negative.   Genitourinary: Negative.   Musculoskeletal: Positive for arthralgias and myalgias.  Skin: Negative.   Allergic/Immunologic: Negative.   Neurological: Positive for dizziness.  Hematological: Negative.   Psychiatric/Behavioral: Negative.   All other systems reviewed and are negative.      Objective:   Physical Exam  General: No acute distress HEENT: EOMI, oral membranes moist Cards: reg rate  Chest: normal effort Abdomen: Soft, NT, ND Skin: dry, intact Extremities: no edema  Musculoskeletal: head forward persistent.  Cervical back: Traps and cervical paraspinals are taut, tender.   Neck range of motion was affected somewhat by the tenderness and spasm.  Low back generally tender to palpation.  She remains antalgic on both knees.. Knees with effusions   Neurological: She is alert and oriented to person, place, and time. Strength strength and sensory function remain normal  psych: appearsfatigued  Assessment & Plan:  ASSESSMENT:  1. History of chronic migraines with aura and cervicalgia with shoulder girdle/cervical myofascial pain.  -underlying cervical spondylosis as well 2. Temporomandibular joint dysfunction.  3. 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 Back symptoms worse because of altered gait mechanics. Recent MRI performed  4. Osteoarthritis of the left knee greater than right with meniscal injury. Chondromalacia patellae.  5. Plantar fibroids.    PLAN:  1.Discussed the fact that she needs to establish some "normalcy" to her life with better sleep habits, increased physical activity, decreased mental stress, etc.   2.continue  Lyrica today 300mg  BID  3. I refilled her Dilaudid 4 mg #12(for more severe pain/headaches)and oxycodone 5 mg #90today.rx'es were provided for next month. We will continue the controlled substance monitoring program, this consists of regular clinic visits, examinations, routine drug screening, pill counts as well as use of [place] Controlled Substance Reporting System. [place] was reviewed today.   -UDS 4. Topamax 100mg  qpm and 50mg  qam 5. Continue with HEP for neck. Needs to maintain posture. 6.Continue Aimovig --improvement by 33%.    I  will see her back here in about60months. 75minof face to face patient care time were spent during this visit. All questions were encouraged and answered.

## 2017-06-25 NOTE — Patient Instructions (Signed)
ONCE YOU SETTLE INTO YOUR NEW SITUATION IN [place], YOU NEED TO WORK ON RE-ESTABLISHING REGULAR SLEEP HABITS AND INCREASE YOUR EXERCISE!!!

## 2017-06-29 LAB — TOXASSURE SELECT,+ANTIDEPR,UR

## 2017-07-03 ENCOUNTER — Telehealth: Payer: Self-pay | Admitting: *Deleted

## 2017-07-03 NOTE — Telephone Encounter (Signed)
Urine drug screen for this encounter is consistent for prescribed medication 

## 2017-07-17 ENCOUNTER — Other Ambulatory Visit: Payer: Self-pay | Admitting: Physical Medicine & Rehabilitation

## 2017-07-17 DIAGNOSIS — G43109 Migraine with aura, not intractable, without status migrainosus: Secondary | ICD-10-CM

## 2017-08-27 ENCOUNTER — Encounter: Payer: Self-pay | Admitting: Physical Medicine & Rehabilitation

## 2017-08-27 ENCOUNTER — Encounter: Payer: Self-pay | Attending: Physical Medicine & Rehabilitation | Admitting: Physical Medicine & Rehabilitation

## 2017-08-27 ENCOUNTER — Ambulatory Visit
Admission: RE | Admit: 2017-08-27 | Discharge: 2017-08-27 | Disposition: A | Discharge status: Home / Self Care | Payer: Commercial Managed Care - PPO | Source: Ambulatory Visit | Attending: Physical Medicine & Rehabilitation | Admitting: Physical Medicine & Rehabilitation

## 2017-08-27 VITALS — BP 107/69 | HR 73 | Ht 69.0 in | Wt 323.0 lb

## 2017-08-27 DIAGNOSIS — Z5181 Encounter for therapeutic drug level monitoring: Secondary | ICD-10-CM

## 2017-08-27 DIAGNOSIS — Z79899 Other long term (current) drug therapy: Secondary | ICD-10-CM | POA: Insufficient documentation

## 2017-08-27 DIAGNOSIS — M47816 Spondylosis without myelopathy or radiculopathy, lumbar region: Secondary | ICD-10-CM

## 2017-08-27 DIAGNOSIS — M170 Bilateral primary osteoarthritis of knee: Secondary | ICD-10-CM

## 2017-08-27 DIAGNOSIS — M7918 Myalgia, other site: Secondary | ICD-10-CM

## 2017-08-27 DIAGNOSIS — G43109 Migraine with aura, not intractable, without status migrainosus: Secondary | ICD-10-CM | POA: Insufficient documentation

## 2017-08-27 DIAGNOSIS — M26609 Unspecified temporomandibular joint disorder, unspecified side: Secondary | ICD-10-CM | POA: Insufficient documentation

## 2017-08-27 DIAGNOSIS — M797 Fibromyalgia: Secondary | ICD-10-CM | POA: Insufficient documentation

## 2017-08-27 MED ORDER — PREDNISONE 20 MG PO TABS: 20.0000 mg | ORAL_TABLET | ORAL | 0 refills | Status: DC

## 2017-08-27 MED ORDER — HYDROMORPHONE HCL 4 MG PO TABS: 4.0000 mg | ORAL_TABLET | ORAL | 0 refills | Status: DC | PRN

## 2017-08-27 MED ORDER — OXYCODONE HCL 5 MG PO TABS: 5.0000 mg | ORAL_TABLET | Freq: Three times a day (TID) | ORAL | 0 refills | Status: DC | PRN

## 2017-08-27 NOTE — Patient Instructions (Signed)
PLEASE FEEL FREE TO CALL OUR OFFICE WITH ANY PROBLEMS OR QUESTIONS ([phone])      
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
## 2017-08-28 ENCOUNTER — Telehealth: Payer: Self-pay | Admitting: Physical Medicine & Rehabilitation

## 2017-08-28 NOTE — Telephone Encounter (Signed)
No we cannot deduct that from these films
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
## 2017-08-28 NOTE — Telephone Encounter (Signed)
Please let Shannon Meadows know that lumbar xrays only show mild degenerative changes. I would continue with the plan as we outlined yesterday, and observe for response. thx

## 2017-09-13 ENCOUNTER — Telehealth: Payer: Self-pay

## 2017-09-13 NOTE — Telephone Encounter (Signed)
received notification that the prior auth was approved for 1 year for aimovig 09-13-2017

## 2017-09-13 NOTE — Telephone Encounter (Signed)
Recieved notification of need for a prior authorization of AIMOVIG.  Prior auth started on 09-12-2017

## 2017-10-01 ENCOUNTER — Other Ambulatory Visit: Payer: Self-pay | Admitting: Physical Medicine & Rehabilitation

## 2017-10-01 DIAGNOSIS — M26609 Unspecified temporomandibular joint disorder, unspecified side: Secondary | ICD-10-CM

## 2017-10-01 DIAGNOSIS — G43109 Migraine with aura, not intractable, without status migrainosus: Secondary | ICD-10-CM

## 2017-10-01 DIAGNOSIS — Z79899 Other long term (current) drug therapy: Secondary | ICD-10-CM

## 2017-10-01 DIAGNOSIS — M797 Fibromyalgia: Secondary | ICD-10-CM

## 2017-10-01 DIAGNOSIS — G894 Chronic pain syndrome: Secondary | ICD-10-CM

## 2017-10-01 DIAGNOSIS — M1712 Unilateral primary osteoarthritis, left knee: Secondary | ICD-10-CM

## 2017-10-01 DIAGNOSIS — Z5181 Encounter for therapeutic drug level monitoring: Secondary | ICD-10-CM

## 2017-10-15 ENCOUNTER — Other Ambulatory Visit: Payer: Self-pay | Admitting: Physical Medicine & Rehabilitation

## 2017-10-15 DIAGNOSIS — M26609 Unspecified temporomandibular joint disorder, unspecified side: Secondary | ICD-10-CM

## 2017-10-15 DIAGNOSIS — Z5181 Encounter for therapeutic drug level monitoring: Secondary | ICD-10-CM

## 2017-10-15 DIAGNOSIS — M1712 Unilateral primary osteoarthritis, left knee: Secondary | ICD-10-CM

## 2017-10-15 DIAGNOSIS — M797 Fibromyalgia: Secondary | ICD-10-CM

## 2017-10-15 DIAGNOSIS — G894 Chronic pain syndrome: Secondary | ICD-10-CM

## 2017-10-15 DIAGNOSIS — G43109 Migraine with aura, not intractable, without status migrainosus: Secondary | ICD-10-CM

## 2017-10-15 DIAGNOSIS — Z79899 Other long term (current) drug therapy: Secondary | ICD-10-CM

## 2017-10-19 ENCOUNTER — Telehealth: Payer: Self-pay

## 2017-10-19 NOTE — Telephone Encounter (Signed)
Prior auth approved, contacted patient, contacted pharmacy

## 2017-10-19 NOTE — Telephone Encounter (Signed)
Pt called with [employer]. [employer] MD#[id number] RxBin#  [id number] Grp# [id number]. Need PA Aimovig.

## 2017-10-19 NOTE — Telephone Encounter (Signed)
Prior Authorization has been submitted to insurance

## 2017-10-29 ENCOUNTER — Encounter: Payer: Self-pay | Attending: Physical Medicine & Rehabilitation | Admitting: Physical Medicine & Rehabilitation

## 2017-10-29 ENCOUNTER — Encounter: Payer: Self-pay | Admitting: Physical Medicine & Rehabilitation

## 2017-10-29 DIAGNOSIS — M26609 Unspecified temporomandibular joint disorder, unspecified side: Secondary | ICD-10-CM | POA: Insufficient documentation

## 2017-10-29 DIAGNOSIS — Z79899 Other long term (current) drug therapy: Secondary | ICD-10-CM | POA: Insufficient documentation

## 2017-10-29 DIAGNOSIS — Z5181 Encounter for therapeutic drug level monitoring: Secondary | ICD-10-CM | POA: Insufficient documentation

## 2017-10-29 DIAGNOSIS — G43109 Migraine with aura, not intractable, without status migrainosus: Secondary | ICD-10-CM | POA: Insufficient documentation

## 2017-10-29 DIAGNOSIS — M170 Bilateral primary osteoarthritis of knee: Secondary | ICD-10-CM | POA: Insufficient documentation

## 2017-10-29 DIAGNOSIS — M797 Fibromyalgia: Secondary | ICD-10-CM | POA: Insufficient documentation

## 2017-10-29 DIAGNOSIS — M47816 Spondylosis without myelopathy or radiculopathy, lumbar region: Secondary | ICD-10-CM

## 2017-10-29 MED ORDER — HYDROMORPHONE HCL 4 MG PO TABS: 4.0000 mg | ORAL_TABLET | ORAL | 0 refills | Status: DC | PRN

## 2017-10-29 MED ORDER — OXYCODONE HCL 5 MG PO TABS: 5.0000 mg | ORAL_TABLET | Freq: Three times a day (TID) | ORAL | 0 refills | Status: DC | PRN

## 2017-10-29 NOTE — Patient Instructions (Signed)
PLEASE FEEL FREE TO CALL OUR OFFICE WITH ANY PROBLEMS OR QUESTIONS ([phone])      
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
## 2017-11-01 ENCOUNTER — Other Ambulatory Visit: Payer: Self-pay | Admitting: Physical Medicine & Rehabilitation

## 2017-11-01 DIAGNOSIS — M170 Bilateral primary osteoarthritis of knee: Secondary | ICD-10-CM

## 2017-11-01 DIAGNOSIS — M47816 Spondylosis without myelopathy or radiculopathy, lumbar region: Secondary | ICD-10-CM

## 2017-11-05 ENCOUNTER — Telehealth: Payer: Self-pay | Admitting: *Deleted

## 2017-11-05 NOTE — Telephone Encounter (Signed)
Shannon Meadows called because she needs PA for her lyrica.

## 2017-11-06 NOTE — Telephone Encounter (Signed)
This has been handled by Yvone Neu.

## 2017-11-26 ENCOUNTER — Other Ambulatory Visit: Payer: Self-pay | Admitting: Physical Medicine & Rehabilitation

## 2017-11-26 DIAGNOSIS — Z79899 Other long term (current) drug therapy: Secondary | ICD-10-CM

## 2017-11-26 DIAGNOSIS — M797 Fibromyalgia: Secondary | ICD-10-CM

## 2017-11-26 DIAGNOSIS — Z5181 Encounter for therapeutic drug level monitoring: Secondary | ICD-10-CM

## 2017-11-26 DIAGNOSIS — M1712 Unilateral primary osteoarthritis, left knee: Secondary | ICD-10-CM

## 2017-11-26 DIAGNOSIS — G43109 Migraine with aura, not intractable, without status migrainosus: Secondary | ICD-10-CM

## 2017-11-26 DIAGNOSIS — G894 Chronic pain syndrome: Secondary | ICD-10-CM

## 2017-11-26 DIAGNOSIS — M26609 Unspecified temporomandibular joint disorder, unspecified side: Secondary | ICD-10-CM

## 2017-12-25 ENCOUNTER — Telehealth: Payer: Self-pay | Admitting: *Deleted

## 2017-12-25 ENCOUNTER — Encounter: Payer: BLUE CROSS/BLUE SHIELD | Admitting: Physical Medicine & Rehabilitation

## 2017-12-25 DIAGNOSIS — M47816 Spondylosis without myelopathy or radiculopathy, lumbar region: Secondary | ICD-10-CM

## 2017-12-25 DIAGNOSIS — M170 Bilateral primary osteoarthritis of knee: Secondary | ICD-10-CM

## 2017-12-25 NOTE — Telephone Encounter (Signed)
Shannon Meadows had to cancel appt due to being sick today.  Appt rescheduled to 02/06/18.  She is requesting [facility]codone [facility] [place]

## 2017-12-26 MED ORDER — HYDROMORPHONE HCL 4 MG PO TABS: 4.0000 mg | ORAL_TABLET | ORAL | 0 refills | Status: DC | PRN

## 2017-12-26 MED ORDER — OXYCODONE HCL 5 MG PO TABS: 5.0000 mg | ORAL_TABLET | Freq: Three times a day (TID) | ORAL | 0 refills | Status: DC | PRN

## 2017-12-26 NOTE — Telephone Encounter (Signed)
Called pt to let her know that medication was sent in but we would need her to move her appt up. I had to leave her a message to call back

## 2017-12-26 NOTE — Telephone Encounter (Signed)
This is the last time I will do this, as I'm pretty sure I have before. Please make a notation in her chart as such.   If her appt is not moved up to 1/4-06/2018 this will happen again. Please schedule her with Zella Ball if needed.

## 2017-12-27 ENCOUNTER — Telehealth: Payer: Self-pay

## 2017-12-27 ENCOUNTER — Other Ambulatory Visit: Payer: Self-pay | Admitting: Physical Medicine & Rehabilitation

## 2017-12-27 DIAGNOSIS — Z5181 Encounter for therapeutic drug level monitoring: Secondary | ICD-10-CM

## 2017-12-27 DIAGNOSIS — M1712 Unilateral primary osteoarthritis, left knee: Secondary | ICD-10-CM

## 2017-12-27 DIAGNOSIS — G894 Chronic pain syndrome: Secondary | ICD-10-CM

## 2017-12-27 DIAGNOSIS — M26609 Unspecified temporomandibular joint disorder, unspecified side: Secondary | ICD-10-CM

## 2017-12-27 DIAGNOSIS — M797 Fibromyalgia: Secondary | ICD-10-CM

## 2017-12-27 DIAGNOSIS — G43109 Migraine with aura, not intractable, without status migrainosus: Secondary | ICD-10-CM

## 2017-12-27 DIAGNOSIS — Z79899 Other long term (current) drug therapy: Secondary | ICD-10-CM

## 2017-12-27 NOTE — Telephone Encounter (Signed)
Oxycodone has been approved, pharmacy and patient notified.

## 2017-12-27 NOTE — Telephone Encounter (Signed)
Pharmacy started a PA for Oxycodone and I can not finish it because it says Member ID [place]. I called pharmacy and that is the number they have. Called pt and left message her to call back with the ID on her insurance card.

## 2018-01-09 ENCOUNTER — Encounter: Payer: BLUE CROSS/BLUE SHIELD | Attending: Physical Medicine & Rehabilitation | Admitting: Registered Nurse

## 2018-01-09 ENCOUNTER — Encounter: Payer: Self-pay | Admitting: Registered Nurse

## 2018-01-09 VITALS — BP 96/67 | HR 72 | Resp 14 | Ht 69.0 in | Wt 323.0 lb

## 2018-01-09 DIAGNOSIS — G43109 Migraine with aura, not intractable, without status migrainosus: Secondary | ICD-10-CM | POA: Diagnosis present

## 2018-01-09 DIAGNOSIS — Z5181 Encounter for therapeutic drug level monitoring: Secondary | ICD-10-CM | POA: Diagnosis present

## 2018-01-09 DIAGNOSIS — I951 Orthostatic hypotension: Secondary | ICD-10-CM

## 2018-01-09 DIAGNOSIS — Z79899 Other long term (current) drug therapy: Secondary | ICD-10-CM

## 2018-01-09 DIAGNOSIS — M26609 Unspecified temporomandibular joint disorder, unspecified side: Secondary | ICD-10-CM | POA: Diagnosis not present

## 2018-01-09 DIAGNOSIS — G8929 Other chronic pain: Secondary | ICD-10-CM

## 2018-01-09 DIAGNOSIS — M7062 Trochanteric bursitis, left hip: Secondary | ICD-10-CM

## 2018-01-09 DIAGNOSIS — M7918 Myalgia, other site: Secondary | ICD-10-CM

## 2018-01-09 DIAGNOSIS — M7061 Trochanteric bursitis, right hip: Secondary | ICD-10-CM | POA: Diagnosis not present

## 2018-01-09 DIAGNOSIS — M47816 Spondylosis without myelopathy or radiculopathy, lumbar region: Secondary | ICD-10-CM

## 2018-01-09 DIAGNOSIS — M25512 Pain in left shoulder: Secondary | ICD-10-CM

## 2018-01-09 DIAGNOSIS — M797 Fibromyalgia: Secondary | ICD-10-CM | POA: Insufficient documentation

## 2018-01-09 DIAGNOSIS — M170 Bilateral primary osteoarthritis of knee: Secondary | ICD-10-CM

## 2018-01-09 DIAGNOSIS — R42 Dizziness and giddiness: Secondary | ICD-10-CM

## 2018-01-09 DIAGNOSIS — M25511 Pain in right shoulder: Secondary | ICD-10-CM

## 2018-01-09 MED ORDER — HYDROMORPHONE HCL 4 MG PO TABS: 4.0000 mg | ORAL_TABLET | ORAL | 0 refills | Status: DC | PRN

## 2018-01-09 MED ORDER — ERENUMAB-AOOE 70 MG/ML ~~LOC~~ SOAJ: 70.0000 mg | SUBCUTANEOUS | 3 refills | Status: DC

## 2018-01-09 MED ORDER — OXYCODONE HCL 5 MG PO TABS: 5.0000 mg | ORAL_TABLET | Freq: Three times a day (TID) | ORAL | 0 refills | Status: DC | PRN

## 2018-01-09 NOTE — Progress Notes (Signed)
Subjective:    Patient ID: Shannon Meadows, female    DOB: 10-14-1970, 47 y.o.   MRN: [id number]  HPI: Shannon Meadows is a 47 y.o. female who returns for follow up appointment for chronic pain and medication refill. She states her pain is located in her bilateral shoulders, lower back, bilateral hips and bilateral knees L>R. Also reports she has a headache with dizziness. She rates her pain 8. Her current exercise regime is walking and performing stretching exercises.  [name] arrived to office hypotensive with complaints of dizziness, orthostatic blood pressures were obtained. [name] refuses ED or urgent care evaluation, she was instructed to call her PCP. She called Dr. Gloris Ham, [facility], they also instructed her to go to Emergency room for evaluation, she refused. She states she took her antihypertensive medication this morning without checking her blood pressure, educated on keeping a blood pressure log and F/U with her PCP, she verbalizes understanding.   [name] equivalent is 56.50MME.  Last UDS was Performed on 06/25/17, it was consistent.   Pain Inventory Average Pain 8 Pain Right Now 8 My pain is intermittent, constant, sharp, burning, dull, stabbing, tingling and aching  In the last 24 hours, has pain interfered with the following? General activity 8 Relation with others 8 Enjoyment of life 8 What TIME of day is your pain at its worst? all Sleep (in general) Poor  Pain is worse with: walking and standing Pain improves with: medication Relief from Meds: 5  Mobility walk without assistance ability to climb steps?  yes do you drive?  yes Do you have any goals in this area?  yes  Function Do you have any goals in this area?  yes  Neuro/Psych numbness dizziness  Prior Studies Any changes since last visit?  no  Physicians involved in your care Any changes since last visit?  no   Family History  Problem Relation Age of Onset  .  Hyperlipidemia Father   . Hypertension Father   . Arthritis Father   . Heart disease Father   . Asthma Mother   . Diabetes Mother   . Arthritis Mother   . Heart disease Mother   . Heart disease Other   . Lung disease Other   . Diabetes Other   . Hypertension Other    Social History   Socioeconomic History  . Marital status: Single    Spouse name: Not on file  . Number of children: Not on file  . Years of education: Not on file  . Highest education level: Not on file  Occupational History  . Not on file  Social Needs  . Financial resource strain: Not on file  . Food insecurity:    Worry: Not on file    Inability: Not on file  . Transportation needs:    Medical: Not on file    Non-medical: Not on file  Tobacco Use  . Smoking status: Never Smoker  . Smokeless tobacco: Never Used  Substance and Sexual Activity  . Alcohol use: Not on file  . Drug use: Not on file  . Sexual activity: Not on file  Lifestyle  . Physical activity:    Days per week: Not on file    Minutes per session: Not on file  . Stress: Not on file  Relationships  . Social connections:    Talks on phone: Not on file    Gets together: Not on file    Attends religious service: Not  on file    Active member of club or organization: Not on file    Attends meetings of clubs or organizations: Not on file    Relationship status: Not on file  Other Topics Concern  . Not on file  Social History Narrative  . Not on file   Past Surgical History:  Procedure Laterality Date  . KNEE ARTHROSCOPY    . [place]  . ULNAR COLLATERAL LIGAMENT RECONSTRUCTION     Past Medical History:  Diagnosis Date  . Back pain   . Cervicalgia   . Chronic headaches   . Chronic pain syndrome   . Endometriosis   . Facet syndrome, lumbar   . Hyperlipidemia   . Hypertension   . Lumbar sprain   . Migraines   . Myofascial pain   . Neck pain   . TMJ syndrome    BP (!) 91/50   Pulse 64   Resp 14    Ht 5\' 9"  (1.753 m)   Wt (!) 323 lb (146.5 kg)   SpO2 97%   BMI 47.70 kg/m   Opioid Risk Score:   Fall Risk Score:  `1  Depression screen PHQ 2/9  Depression screen [facility] 2/9 04/19/2015 04/10/2014  Decreased Interest 0 0  Down, Depressed, Hopeless 1 1  PHQ - 2 Score 1 1  Altered sleeping - 2  Tired, decreased energy - 2  Change in appetite - 1  Feeling bad or failure about yourself  - 0  Trouble concentrating - 0  Moving slowly or fidgety/restless - 0  Suicidal thoughts - 0  PHQ-9 Score - 6    Review of Systems  Constitutional: Negative.   HENT: Negative.   Eyes: Negative.   Respiratory: Negative.   Cardiovascular: Negative.   Gastrointestinal: Negative.   Endocrine: Negative.   Genitourinary: Negative.   Musculoskeletal: Positive for arthralgias, back pain, myalgias and neck pain.  Skin: Negative.   Allergic/Immunologic: Negative.   Neurological: Positive for dizziness, light-headedness, numbness and headaches.  Hematological: Negative.   Psychiatric/Behavioral: Negative.   All other systems reviewed and are negative.      Objective:   Physical Exam Vitals signs and nursing note reviewed.  Constitutional:      Appearance: Normal appearance.  Neck:     Musculoskeletal: Normal range of motion and neck supple.  Cardiovascular:     Rate and Rhythm: Normal rate and regular rhythm.     Pulses: Normal pulses.     Heart sounds: Normal heart sounds.  Pulmonary:     Effort: Pulmonary effort is normal.     Breath sounds: Normal breath sounds.  Musculoskeletal:     Comments: Normal Muscle Bulk and Muscle Testing Reveals:  Upper Extremities: Full ROM and Muscle Strength 5/5 Thoracic Paraspinal Tenderness: T-7-T-9 Lumbar Paraspinal Tenderness: L-4-L-5 Lower Extremities: Full ROM and Muscle Strength 5/5 Bilateral Lower Extremity Flexion Produces Pain into Bilateral Patella's Arises from Table with ease Narrow Based Gait   Skin:    General: Skin is warm and dry.    Neurological:     Mental Status: She is alert and oriented to person, place, and time.  Psychiatric:        Mood and Affect: Mood normal.        Behavior: Behavior normal.           Assessment & Plan:  1. History of chronic migraines with aura and cervicalgia with myofascial pain.Refilled: Dilaudid 4mg  1-2 tablets Daily PRN #12.  Was given a second script. 01/09/2018 2. 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 Continue with Exercise and use Heat therapy. 01/09/18 Refilled: Oxycodone 5 mg one tablet every 8 hours as needed #90. Was given a second script. 01/09/18 4.  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 Chondromalacia patellae changes.Continue to monitor.  5. Orthostatic Hypotension/ Dizziness: Refuses ED or Urgent Care Evaluation. Instructed to call her PCP, she called Dr. Gloris Ham office at [facility]. She reports she was instructed to go to ED for evaluation, she refuses. Instructed to keep blood pressure log and F/U with her PCP. She verbalizes understanding.    30 minutes of face to face patient care time was spent during this visit. All questions were encouraged and answered.   F/U in 2 months

## 2018-02-06 ENCOUNTER — Ambulatory Visit: Payer: Self-pay | Admitting: Physical Medicine & Rehabilitation

## 2018-02-23 ENCOUNTER — Other Ambulatory Visit: Payer: Self-pay | Admitting: Physical Medicine & Rehabilitation

## 2018-02-23 DIAGNOSIS — M47816 Spondylosis without myelopathy or radiculopathy, lumbar region: Secondary | ICD-10-CM

## 2018-02-23 DIAGNOSIS — M170 Bilateral primary osteoarthritis of knee: Secondary | ICD-10-CM

## 2018-03-11 ENCOUNTER — Other Ambulatory Visit: Payer: Self-pay | Admitting: Physical Medicine & Rehabilitation

## 2018-03-11 ENCOUNTER — Encounter: Payer: Self-pay | Admitting: Physical Medicine & Rehabilitation

## 2018-03-11 ENCOUNTER — Encounter
Payer: BLUE CROSS/BLUE SHIELD | Attending: Physical Medicine & Rehabilitation | Admitting: Physical Medicine & Rehabilitation

## 2018-03-11 VITALS — Ht 68.0 in | Wt 323.0 lb

## 2018-03-11 DIAGNOSIS — G894 Chronic pain syndrome: Secondary | ICD-10-CM | POA: Diagnosis not present

## 2018-03-11 DIAGNOSIS — G43109 Migraine with aura, not intractable, without status migrainosus: Secondary | ICD-10-CM

## 2018-03-11 DIAGNOSIS — M170 Bilateral primary osteoarthritis of knee: Secondary | ICD-10-CM | POA: Insufficient documentation

## 2018-03-11 DIAGNOSIS — Z79899 Other long term (current) drug therapy: Secondary | ICD-10-CM | POA: Diagnosis present

## 2018-03-11 DIAGNOSIS — M1712 Unilateral primary osteoarthritis, left knee: Secondary | ICD-10-CM

## 2018-03-11 DIAGNOSIS — M797 Fibromyalgia: Secondary | ICD-10-CM | POA: Diagnosis present

## 2018-03-11 DIAGNOSIS — M26609 Unspecified temporomandibular joint disorder, unspecified side: Secondary | ICD-10-CM | POA: Insufficient documentation

## 2018-03-11 DIAGNOSIS — M7918 Myalgia, other site: Secondary | ICD-10-CM

## 2018-03-11 DIAGNOSIS — M47816 Spondylosis without myelopathy or radiculopathy, lumbar region: Secondary | ICD-10-CM

## 2018-03-11 DIAGNOSIS — Z5181 Encounter for therapeutic drug level monitoring: Secondary | ICD-10-CM | POA: Diagnosis present

## 2018-03-11 DIAGNOSIS — Z79891 Long term (current) use of opiate analgesic: Secondary | ICD-10-CM

## 2018-03-11 MED ORDER — HYDROMORPHONE HCL 4 MG PO TABS: 4.0000 mg | ORAL_TABLET | ORAL | 0 refills | Status: DC | PRN

## 2018-03-11 MED ORDER — OXYCODONE HCL 5 MG PO TABS: 5.0000 mg | ORAL_TABLET | Freq: Three times a day (TID) | ORAL | 0 refills | Status: DC | PRN
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
## 2018-03-11 NOTE — Patient Instructions (Signed)
PLEASE FEEL FREE TO CALL OUR OFFICE WITH ANY PROBLEMS OR QUESTIONS ([phone])      

## 2018-03-12 ENCOUNTER — Telehealth: Payer: Self-pay | Admitting: Registered Nurse

## 2018-03-12 NOTE — Telephone Encounter (Signed)
[facility] spoke with [facility]ist. They will not allow a early refill. The pharmacist states Ms. Finamore are on several sedating medications, and they will not allow an early refill. Ms. Roorda had 30 tablets of Oxycodone on 03/11/2018, she has 10 days worth of medication. Per Dr. Naaman Plummer he has given permission for a bridge prescription for two days only, this is not a guarantee another pharmacist will fill the prescription. Sybil RN placed a call to Ms. Hilgeman regarding the above, we will await her return call.

## 2018-03-13 ENCOUNTER — Telehealth: Payer: Self-pay | Admitting: *Deleted

## 2018-03-13 NOTE — Telephone Encounter (Signed)
Patient left a message stating that she is returning a call from [place].  She reports that she has 18 tablets to get her to her next refill.  She states she should be okay.  She states frustration with pharmacy in trying to keep her med pickups on the same date.

## 2018-03-17 LAB — TOXASSURE SELECT,+ANTIDEPR,UR

## 2018-03-18 ENCOUNTER — Telehealth: Payer: Self-pay | Admitting: *Deleted

## 2018-03-18 NOTE — Telephone Encounter (Signed)
Urine drug screen for this encounter is consistent for prescribed medication 

## 2018-05-06 ENCOUNTER — Telehealth: Payer: Self-pay | Admitting: Physical Medicine & Rehabilitation

## 2018-05-06 NOTE — Telephone Encounter (Signed)
Patient would prefer that she not have televisit this month due to phone issues- can she do a refill without it this month

## 2018-05-06 NOTE — Telephone Encounter (Signed)
Phone doesn't work?????. Needs televisit

## 2018-05-08 ENCOUNTER — Encounter: Payer: Self-pay | Admitting: Physical Medicine & Rehabilitation

## 2018-05-08 ENCOUNTER — Encounter
Payer: BLUE CROSS/BLUE SHIELD | Attending: Physical Medicine & Rehabilitation | Admitting: Physical Medicine & Rehabilitation

## 2018-05-08 ENCOUNTER — Other Ambulatory Visit: Payer: Self-pay

## 2018-05-08 VITALS — Ht 68.0 in | Wt 323.0 lb

## 2018-05-08 DIAGNOSIS — Z5181 Encounter for therapeutic drug level monitoring: Secondary | ICD-10-CM | POA: Insufficient documentation

## 2018-05-08 DIAGNOSIS — G43109 Migraine with aura, not intractable, without status migrainosus: Secondary | ICD-10-CM | POA: Diagnosis not present

## 2018-05-08 DIAGNOSIS — M26609 Unspecified temporomandibular joint disorder, unspecified side: Secondary | ICD-10-CM | POA: Insufficient documentation

## 2018-05-08 DIAGNOSIS — M170 Bilateral primary osteoarthritis of knee: Secondary | ICD-10-CM | POA: Diagnosis not present

## 2018-05-08 DIAGNOSIS — Z79899 Other long term (current) drug therapy: Secondary | ICD-10-CM | POA: Insufficient documentation

## 2018-05-08 DIAGNOSIS — M797 Fibromyalgia: Secondary | ICD-10-CM | POA: Insufficient documentation

## 2018-05-08 DIAGNOSIS — M47816 Spondylosis without myelopathy or radiculopathy, lumbar region: Secondary | ICD-10-CM | POA: Diagnosis not present

## 2018-05-08 MED ORDER — OXYCODONE HCL 5 MG PO TABS: 5.0000 mg | ORAL_TABLET | Freq: Three times a day (TID) | ORAL | 0 refills | Status: DC | PRN

## 2018-05-08 MED ORDER — HYDROMORPHONE HCL 4 MG PO TABS: 4.0000 mg | ORAL_TABLET | ORAL | 0 refills | Status: DC | PRN
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
## 2018-07-08 ENCOUNTER — Encounter: Payer: Self-pay | Admitting: Registered Nurse

## 2018-07-08 ENCOUNTER — Other Ambulatory Visit: Payer: Self-pay

## 2018-07-08 ENCOUNTER — Encounter: Payer: Self-pay | Attending: Physical Medicine & Rehabilitation | Admitting: Registered Nurse

## 2018-07-08 VITALS — BP 169/123 | HR 73 | Temp 98.5°F | Resp 14 | Ht 69.0 in | Wt 356.0 lb

## 2018-07-08 DIAGNOSIS — M47816 Spondylosis without myelopathy or radiculopathy, lumbar region: Secondary | ICD-10-CM

## 2018-07-08 DIAGNOSIS — Z79899 Other long term (current) drug therapy: Secondary | ICD-10-CM | POA: Insufficient documentation

## 2018-07-08 DIAGNOSIS — G894 Chronic pain syndrome: Secondary | ICD-10-CM

## 2018-07-08 DIAGNOSIS — G43109 Migraine with aura, not intractable, without status migrainosus: Secondary | ICD-10-CM | POA: Insufficient documentation

## 2018-07-08 DIAGNOSIS — M25512 Pain in left shoulder: Secondary | ICD-10-CM

## 2018-07-08 DIAGNOSIS — M797 Fibromyalgia: Secondary | ICD-10-CM | POA: Insufficient documentation

## 2018-07-08 DIAGNOSIS — G8929 Other chronic pain: Secondary | ICD-10-CM

## 2018-07-08 DIAGNOSIS — M25521 Pain in right elbow: Secondary | ICD-10-CM

## 2018-07-08 DIAGNOSIS — M26609 Unspecified temporomandibular joint disorder, unspecified side: Secondary | ICD-10-CM | POA: Insufficient documentation

## 2018-07-08 DIAGNOSIS — Z5181 Encounter for therapeutic drug level monitoring: Secondary | ICD-10-CM | POA: Insufficient documentation

## 2018-07-08 DIAGNOSIS — I10 Essential (primary) hypertension: Secondary | ICD-10-CM

## 2018-07-08 DIAGNOSIS — M170 Bilateral primary osteoarthritis of knee: Secondary | ICD-10-CM | POA: Insufficient documentation

## 2018-07-08 DIAGNOSIS — Z79891 Long term (current) use of opiate analgesic: Secondary | ICD-10-CM

## 2018-07-08 MED ORDER — HYDROMORPHONE HCL 4 MG PO TABS: 4.0000 mg | ORAL_TABLET | ORAL | 0 refills | Status: DC | PRN

## 2018-07-08 MED ORDER — OXYCODONE HCL 5 MG PO TABS: 5.0000 mg | ORAL_TABLET | Freq: Three times a day (TID) | ORAL | 0 refills | Status: DC | PRN

## 2018-07-08 NOTE — Progress Notes (Signed)
Subjective:    Patient ID: Shannon Meadows, female    DOB: 05-Sep-1970, 48 y.o.   MRN: [id number]  HPI: Shannon Meadows is a 48 y.o. female who returns for follow up appointment for chronic pain and medication refill. She states her pain is located in her left elbow and left shoulder. Also reports she has a headache today. She rates her pain 8. Her  current exercise regime is walking and attending physical therapy two days a week.   [name] arrived hypertensive, blood pressure re-checked, she states she hasn't take her Valsartan over a month. She states her PCP decrease her to 1/2 tablet with parameters. [name] refuses Ed evaluation, she states she will take her Diovan. Also states she will go to the emergency room , in [place]. She was instructed to call office when she arrived to the emergency room, she verbalizes understanding.   [name] Morphine equivalent is 118.50  MME.  Last UDs was Performed on 03/11/2018, it was consistent.    Pain Inventory Average Pain 8 Pain Right Now 8 My pain is intermittent, constant, sharp, dull, stabbing, tingling and aching  In the last 24 hours, has pain interfered with the following? General activity 7 Relation with others 7 Enjoyment of life 7 What TIME of day is your pain at its worst? daytime Sleep (in general) Fair  Pain is worse with: bending, inactivity, unsure and some activites Pain improves with: rest, therapy/exercise, pacing activities and medication Relief from Meds: 5  Mobility walk without assistance ability to climb steps?  yes do you drive?  yes  Function not employed: date last employed 05/25/18 workmans comp after fall  Neuro/Psych weakness  Prior Studies Any changes since last visit?  yes CT/MRI  Fell and dislocated L elbow at work.  Physicians involved in your care Any changes since last visit?  no   Family History  Problem Relation Age of Onset  . Hyperlipidemia Father   . Hypertension Father   . Arthritis  Father   . Heart disease Father   . Asthma Mother   . Diabetes Mother   . Arthritis Mother   . Heart disease Mother   . Heart disease Other   . Lung disease Other   . Diabetes Other   . Hypertension Other    Social History   Socioeconomic History  . Marital status: Single    Spouse name: Not on file  . Number of children: Not on file  . Years of education: Not on file  . Highest education level: Not on file  Occupational History  . Not on file  Social Needs  . Financial resource strain: Not on file  . Food insecurity    Worry: Not on file    Inability: Not on file  . Transportation needs    Medical: Not on file    Non-medical: Not on file  Tobacco Use  . Smoking status: Never Smoker  . Smokeless tobacco: Never Used  Substance and Sexual Activity  . Alcohol use: Not on file  . Drug use: Not on file  . Sexual activity: Not on file  Lifestyle  . Physical activity    Days per week: Not on file    Minutes per session: Not on file  . Stress: Not on file  Relationships  . Social Herbalist on phone: Not on file    Gets together: Not on file    Attends religious service: Not on  file    Active member of club or organization: Not on file    Attends meetings of clubs or organizations: Not on file    Relationship status: Not on file  Other Topics Concern  . Not on file  Social History Narrative  . Not on file   Past Surgical History:  Procedure Laterality Date  . KNEE ARTHROSCOPY    . [place]  . ULNAR COLLATERAL LIGAMENT RECONSTRUCTION     Past Medical History:  Diagnosis Date  . Back pain   . Cervicalgia   . Chronic headaches   . Chronic pain syndrome   . Endometriosis   . Facet syndrome, lumbar   . Hyperlipidemia   . Hypertension   . Lumbar sprain   . Migraines   . Myofascial pain   . Neck pain   . TMJ syndrome    There were no vitals taken for this visit.  Opioid Risk Score:   Fall Risk Score:  `1  Depression  screen PHQ 2/9  Depression screen [facility] 2/9 05/08/2018 04/19/2015 04/10/2014  Decreased Interest 0 0 0  Down, Depressed, Hopeless 0 1 1  PHQ - 2 Score 0 1 1  Altered sleeping - - 2  Tired, decreased energy - - 2  Change in appetite - - 1  Feeling bad or failure about yourself  - - 0  Trouble concentrating - - 0  Moving slowly or fidgety/restless - - 0  Suicidal thoughts - - 0  PHQ-9 Score - - 6     Review of Systems  Constitutional: Negative.   HENT: Negative.   Eyes: Negative.   Respiratory: Negative.   Cardiovascular: Negative.   Gastrointestinal: Negative.   Endocrine: Negative.   Genitourinary: Negative.   Musculoskeletal: Positive for arthralgias.       L elbow injury  Skin: Negative.   Allergic/Immunologic: Negative.   Neurological: Positive for weakness.  Hematological: Negative.   Psychiatric/Behavioral: Negative.   All other systems reviewed and are negative.      Objective:   Physical Exam Vitals signs and nursing note reviewed.  Constitutional:      Appearance: Normal appearance. She is obese.  Neck:     Musculoskeletal: Normal range of motion and neck supple.  Cardiovascular:     Rate and Rhythm: Normal rate and regular rhythm.     Pulses: Normal pulses.     Heart sounds: Normal heart sounds.  Pulmonary:     Effort: Pulmonary effort is normal.     Breath sounds: Normal breath sounds.  Musculoskeletal:     Comments: Normal Muscle Bulk and Muscle Testing Reveals:  Upper Extremities: Full ROM and Muscle Strength 5/5 Left AC Joint Tenderness  Thoracic Paraspinal Tenderness:T-1-T-3  Lower Extremities: Full ROM and Muscle Strength 5/5 Bilateral Lower Extremities Flexion Produces Pain inbto Bilateral Patella's L>R Arises from Table Slowly Narrow Based Gait   Skin:    General: Skin is warm and dry.  Neurological:     Mental Status: She is alert and oriented to person, place, and time.  Psychiatric:        Mood and Affect: Mood normal.        Behavior:  Behavior normal.           Assessment & Plan:  1.  History of chronic migraines with aura and cervicalgia with myofascial pain.Refilled: Dilaudid 4mg  1- tablet 4 hour as needed for severe pain. #12. Was given a second script. 07/08/2018. 2. 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 Continue with Exercise and use Heat therapy. 07/08/2018 Refilled: Oxycodone 5 mg one tablet every 8 hours as needed #90. Was given a second script for the following month. 07/08/2018 4.  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 Chondromalacia patellae changes.Continue to monitor. 07/08/2018 5.Uncontrolled Hypertension: Refuses ED or Urgent Care Evaluation. She states she  hasn't taken her antihypertensive medication over a month, reports her PCP gave her parameters regarding her Diovan. Also states she checks her blood pressure daily and her blood pressure has been  Running in the 130/ 80's-90's.Ms. Vaughan states she will be going to the ED in [place], she was instructed to call office with update, she verbalizes understanding.  6. Right elbow pain: Ortho Following. Continue to monitor.  7. Chronic Left Shoulder pain: Continue HEP as Tolerated. Continue to Monitor.  20 minutes of face to face patient care time was spent during this visit. All questions were encouraged and answered.   F/U in 2 months

## 2018-07-21 ENCOUNTER — Other Ambulatory Visit: Payer: Self-pay | Admitting: Physical Medicine & Rehabilitation

## 2018-07-21 DIAGNOSIS — G43109 Migraine with aura, not intractable, without status migrainosus: Secondary | ICD-10-CM

## 2018-07-24 ENCOUNTER — Other Ambulatory Visit: Payer: Self-pay | Admitting: Physical Medicine & Rehabilitation

## 2018-07-24 DIAGNOSIS — M47816 Spondylosis without myelopathy or radiculopathy, lumbar region: Secondary | ICD-10-CM

## 2018-07-24 DIAGNOSIS — M170 Bilateral primary osteoarthritis of knee: Secondary | ICD-10-CM

## 2018-08-05 ENCOUNTER — Telehealth: Payer: Self-pay | Admitting: *Deleted

## 2018-08-05 NOTE — Telephone Encounter (Signed)
(  late entry) Prior auth submitted to [employer] for Aimovig70mg /ml autoinjector on 03/28/18.  Approval given 03/28/2018 through 03/28/2019.

## 2018-09-16 ENCOUNTER — Encounter: Payer: Self-pay | Attending: Physical Medicine & Rehabilitation | Admitting: Registered Nurse

## 2018-09-16 ENCOUNTER — Other Ambulatory Visit: Payer: Self-pay

## 2018-09-16 ENCOUNTER — Encounter: Payer: Self-pay | Admitting: Registered Nurse

## 2018-09-16 VITALS — BP 123/83 | HR 100 | Temp 98.5°F | Resp 20 | Ht 69.0 in | Wt 362.6 lb

## 2018-09-16 DIAGNOSIS — Z79891 Long term (current) use of opiate analgesic: Secondary | ICD-10-CM

## 2018-09-16 DIAGNOSIS — M47816 Spondylosis without myelopathy or radiculopathy, lumbar region: Secondary | ICD-10-CM

## 2018-09-16 DIAGNOSIS — G894 Chronic pain syndrome: Secondary | ICD-10-CM

## 2018-09-16 DIAGNOSIS — M26609 Unspecified temporomandibular joint disorder, unspecified side: Secondary | ICD-10-CM | POA: Insufficient documentation

## 2018-09-16 DIAGNOSIS — Z79899 Other long term (current) drug therapy: Secondary | ICD-10-CM | POA: Insufficient documentation

## 2018-09-16 DIAGNOSIS — M170 Bilateral primary osteoarthritis of knee: Secondary | ICD-10-CM | POA: Insufficient documentation

## 2018-09-16 DIAGNOSIS — G43109 Migraine with aura, not intractable, without status migrainosus: Secondary | ICD-10-CM | POA: Insufficient documentation

## 2018-09-16 DIAGNOSIS — M542 Cervicalgia: Secondary | ICD-10-CM

## 2018-09-16 DIAGNOSIS — M797 Fibromyalgia: Secondary | ICD-10-CM | POA: Insufficient documentation

## 2018-09-16 DIAGNOSIS — Z5181 Encounter for therapeutic drug level monitoring: Secondary | ICD-10-CM | POA: Insufficient documentation

## 2018-09-16 DIAGNOSIS — M25521 Pain in right elbow: Secondary | ICD-10-CM

## 2018-09-16 MED ORDER — HYDROMORPHONE HCL 4 MG PO TABS: 4.0000 mg | ORAL_TABLET | ORAL | 0 refills | Status: DC | PRN

## 2018-09-16 MED ORDER — OXYCODONE HCL 5 MG PO TABS: 5.0000 mg | ORAL_TABLET | Freq: Three times a day (TID) | ORAL | 0 refills | Status: DC | PRN

## 2018-09-16 NOTE — Progress Notes (Signed)
Subjective:    Patient ID: Shannon Meadows, female    DOB: 05-22-1970, 48 y.o.   MRN: [id number]  HPI: Shannon Meadows is a 48 y.o. female who returns for follow up appointment for chronic pain and medication refill. She states her pain is located in her neck, right elbow and lower back. Also reports she has a migraine today. She  rates her pain 6. Her  current exercise regime is walking and attending physical therapy two days a week.   Shannon Meadows Morphine equivalent is 118.50 MME.  UDS ordered today.   Pain Inventory Average Pain 7 Pain Right Now 6 My pain is intermittent, sharp, dull, stabbing, tingling and aching  In the last 24 hours, has pain interfered with the following? General activity 6 Relation with others 6 Enjoyment of life 6 What TIME of day is your pain at its worst? daytime Sleep (in general) Fair  Pain is worse with: unsure and some activites Pain improves with: rest, heat/ice, pacing activities, medication and injections Relief from Meds: 5  Mobility walk without assistance ability to climb steps?  yes do you drive?  yes Do you have any goals in this area?  yes  Function employed # of hrs/week workers comp I need assistance with the following:  household duties Do you have any goals in this area?  yes  Neuro/Psych No problems in this area  Prior Studies Any changes since last visit?  no  Physicians involved in your care Any changes since last visit?  no   Family History  Problem Relation Age of Onset  . Hyperlipidemia Father   . Hypertension Father   . Arthritis Father   . Heart disease Father   . Asthma Mother   . Diabetes Mother   . Arthritis Mother   . Heart disease Mother   . Heart disease Other   . Lung disease Other   . Diabetes Other   . Hypertension Other    Social History   Socioeconomic History  . Marital status: Single    Spouse name: Not on file  . Number of children: Not on file  . Years of education: Not on file  . Highest  education level: Not on file  Occupational History  . Not on file  Social Needs  . Financial resource strain: Not on file  . Food insecurity    Worry: Not on file    Inability: Not on file  . Transportation needs    Medical: Not on file    Non-medical: Not on file  Tobacco Use  . Smoking status: Never Smoker  . Smokeless tobacco: Never Used  Substance and Sexual Activity  . Alcohol use: Not on file  . Drug use: Not on file  . Sexual activity: Not on file  Lifestyle  . Physical activity    Days per week: Not on file    Minutes per session: Not on file  . Stress: Not on file  Relationships  . Social Herbalist on phone: Not on file    Gets together: Not on file    Attends religious service: Not on file    Active member of club or organization: Not on file    Attends meetings of clubs or organizations: Not on file    Relationship status: Not on file  Other Topics Concern  . Not on file  Social History Narrative  . Not on file   Past Surgical History:  Procedure Laterality  Date  . KNEE ARTHROSCOPY    . [employer]  . ULNAR COLLATERAL LIGAMENT RECONSTRUCTION     Past Medical History:  Diagnosis Date  . Back pain   . Cervicalgia   . Chronic headaches   . Chronic pain syndrome   . Endometriosis   . Facet syndrome, lumbar   . Hyperlipidemia   . Hypertension   . Lumbar sprain   . Migraines   . Myofascial pain   . Neck pain   . TMJ syndrome    There were no vitals taken for this visit.  Opioid Risk Score:   Fall Risk Score:  `1  Depression screen PHQ 2/9  Depression screen [facility] 2/9 05/08/2018 04/19/2015 04/10/2014  Decreased Interest 0 0 0  Down, Depressed, Hopeless 0 1 1  PHQ - 2 Score 0 1 1  Altered sleeping - - 2  Tired, decreased energy - - 2  Change in appetite - - 1  Feeling bad or failure about yourself  - - 0  Trouble concentrating - - 0  Moving slowly or fidgety/restless - - 0  Suicidal thoughts - - 0  PHQ-9 Score  - - 6     Review of Systems  Constitutional: Positive for appetite change.  HENT: Negative.   Eyes: Negative.   Respiratory: Positive for wheezing.   Cardiovascular: Positive for leg swelling.  Gastrointestinal: Positive for constipation and nausea.  Endocrine: Negative.   Genitourinary: Negative.   Musculoskeletal: Positive for back pain, myalgias and neck pain.  Skin: Negative.   Allergic/Immunologic: Negative.   Neurological: Negative.   Hematological: Negative.   Psychiatric/Behavioral: Negative.   All other systems reviewed and are negative.      Objective:   Physical Exam Vitals signs and nursing note reviewed.  Constitutional:      Appearance: Normal appearance.  Neck:     Musculoskeletal: Normal range of motion and neck supple.     Comments: Cervical Paraspinal Tenderness: C-5-C-6 Cardiovascular:     Rate and Rhythm: Normal rate and regular rhythm.     Pulses: Normal pulses.     Heart sounds: Normal heart sounds.  Pulmonary:     Effort: Pulmonary effort is normal.     Breath sounds: Normal breath sounds.  Musculoskeletal:     Comments: Normal Muscle Bulk and Muscle Testing Reveals:  Upper Extremities: Full ROM and Muscle Strength 4/5  Lower Extremities: Full ROM and Muscle Strength 5/5 Arises from Table with ease Narrow Based  Gait   Skin:    General: Skin is warm and dry.  Neurological:     Mental Status: She is alert and oriented to person, place, and time.  Psychiatric:        Mood and Affect: Mood normal.        Behavior: Behavior normal.           Assessment & Plan:  1.  History of chronic migraines with aura and cervicalgia with myofascial pain.Refilled: Dilaudid 4mg  1- tablet 4 hour as needed for severe pain. #12. Was given a second script.09/16/2018. 2. Lumbar facet disease with history of disk disease as well. Continue with Exercise and use Heat therapy. 09/16/2018 Refilled: Oxycodone 5 mg one tablet every 8 hours as needed #90. Was  given a second script for the following month.09/16/2018 4.Primary BilateralOsteoarthritis of the left knee greater than right with meniscal. Chondromalacia patellae changes.Continue to monitor. 09/16/2018 5. Right elbow pain: Ortho Following. Continue to monitor. 09/16/2018 7. Chronic  Left Shoulder pain: No complaints today. Continue HEP as Tolerated. Continue to Monitor.09/16/2018  15 minutes of face to face patient care time was spent during this visit. All questions were encouraged and answered.   F/U in 2 months

## 2018-09-19 ENCOUNTER — Telehealth: Payer: Self-pay | Admitting: *Deleted

## 2018-09-19 LAB — TOXASSURE SELECT,+ANTIDEPR,UR

## 2018-09-19 NOTE — Telephone Encounter (Signed)
Urine drug screen for this encounter is consistent for prescribed medication 

## 2018-11-13 ENCOUNTER — Encounter: Payer: Self-pay | Attending: Physical Medicine & Rehabilitation | Admitting: Physical Medicine & Rehabilitation

## 2018-11-13 ENCOUNTER — Encounter: Payer: Self-pay | Admitting: Physical Medicine & Rehabilitation

## 2018-11-13 ENCOUNTER — Other Ambulatory Visit: Payer: Self-pay

## 2018-11-13 VITALS — BP 145/101 | HR 93 | Temp 98.4°F | Ht 69.0 in | Wt 364.4 lb

## 2018-11-13 DIAGNOSIS — M797 Fibromyalgia: Secondary | ICD-10-CM | POA: Insufficient documentation

## 2018-11-13 DIAGNOSIS — Z79899 Other long term (current) drug therapy: Secondary | ICD-10-CM | POA: Insufficient documentation

## 2018-11-13 DIAGNOSIS — M47816 Spondylosis without myelopathy or radiculopathy, lumbar region: Secondary | ICD-10-CM

## 2018-11-13 DIAGNOSIS — M170 Bilateral primary osteoarthritis of knee: Secondary | ICD-10-CM | POA: Insufficient documentation

## 2018-11-13 DIAGNOSIS — G43109 Migraine with aura, not intractable, without status migrainosus: Secondary | ICD-10-CM | POA: Insufficient documentation

## 2018-11-13 DIAGNOSIS — S53105S Unspecified dislocation of left ulnohumeral joint, sequela: Secondary | ICD-10-CM

## 2018-11-13 DIAGNOSIS — M26609 Unspecified temporomandibular joint disorder, unspecified side: Secondary | ICD-10-CM | POA: Insufficient documentation

## 2018-11-13 DIAGNOSIS — S53105A Unspecified dislocation of left ulnohumeral joint, initial encounter: Secondary | ICD-10-CM | POA: Insufficient documentation

## 2018-11-13 DIAGNOSIS — Z5181 Encounter for therapeutic drug level monitoring: Secondary | ICD-10-CM | POA: Insufficient documentation

## 2018-11-13 MED ORDER — HYDROMORPHONE HCL 4 MG PO TABS: 4.0000 mg | ORAL_TABLET | ORAL | 0 refills | Status: DC | PRN

## 2018-11-13 MED ORDER — OXYCODONE HCL 5 MG PO TABS: 5.0000 mg | ORAL_TABLET | Freq: Three times a day (TID) | ORAL | 0 refills | Status: DC | PRN

## 2018-11-13 NOTE — Patient Instructions (Signed)
PLEASE FEEL FREE TO CALL OUR OFFICE WITH ANY PROBLEMS OR QUESTIONS [phone])     MAKE SURE YOU ARE EXERCISING AND WORKING ON YOUR BACK AND PHYSICAL FITNESSED
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
## 2018-11-14 ENCOUNTER — Telehealth: Payer: Self-pay | Admitting: *Deleted

## 2018-11-14 DIAGNOSIS — M170 Bilateral primary osteoarthritis of knee: Secondary | ICD-10-CM

## 2018-11-14 DIAGNOSIS — M47816 Spondylosis without myelopathy or radiculopathy, lumbar region: Secondary | ICD-10-CM

## 2018-11-14 MED ORDER — HYDROMORPHONE HCL 4 MG PO TABS: 4.0000 mg | ORAL_TABLET | ORAL | 0 refills | Status: DC | PRN

## 2018-11-14 MED ORDER — OXYCODONE HCL 5 MG PO TABS: 5.0000 mg | ORAL_TABLET | Freq: Three times a day (TID) | ORAL | 0 refills | Status: DC | PRN

## 2018-11-14 NOTE — Telephone Encounter (Signed)
Patient left a message stating her medication was sent to the wrong pharmacy (she has 3 on file). [facility] [place] [place]  She needs both meds ASAP, almost out.  [place] and cancelled scripts on file.

## 2018-11-14 NOTE — Telephone Encounter (Signed)
PMP wa reviewed. Hydromorphone and Oxycodone e-scribed for this month and the following month. Placed a call to Ms. Sistrunk, she verbalizes understanding.

## 2018-12-12 ENCOUNTER — Other Ambulatory Visit: Payer: Self-pay | Admitting: Physical Medicine & Rehabilitation

## 2018-12-12 DIAGNOSIS — G894 Chronic pain syndrome: Secondary | ICD-10-CM

## 2018-12-12 DIAGNOSIS — M1712 Unilateral primary osteoarthritis, left knee: Secondary | ICD-10-CM

## 2018-12-12 DIAGNOSIS — G43109 Migraine with aura, not intractable, without status migrainosus: Secondary | ICD-10-CM

## 2018-12-12 DIAGNOSIS — M797 Fibromyalgia: Secondary | ICD-10-CM

## 2018-12-12 DIAGNOSIS — Z79899 Other long term (current) drug therapy: Secondary | ICD-10-CM

## 2018-12-12 DIAGNOSIS — M26609 Unspecified temporomandibular joint disorder, unspecified side: Secondary | ICD-10-CM

## 2018-12-12 DIAGNOSIS — Z5181 Encounter for therapeutic drug level monitoring: Secondary | ICD-10-CM

## 2018-12-14 ENCOUNTER — Other Ambulatory Visit: Payer: Self-pay | Admitting: Physical Medicine & Rehabilitation

## 2018-12-14 DIAGNOSIS — M47816 Spondylosis without myelopathy or radiculopathy, lumbar region: Secondary | ICD-10-CM

## 2018-12-14 DIAGNOSIS — M170 Bilateral primary osteoarthritis of knee: Secondary | ICD-10-CM

## 2018-12-16 ENCOUNTER — Other Ambulatory Visit: Payer: Self-pay | Admitting: *Deleted

## 2018-12-16 DIAGNOSIS — G43109 Migraine with aura, not intractable, without status migrainosus: Secondary | ICD-10-CM

## 2018-12-16 DIAGNOSIS — Z5181 Encounter for therapeutic drug level monitoring: Secondary | ICD-10-CM

## 2018-12-16 DIAGNOSIS — G894 Chronic pain syndrome: Secondary | ICD-10-CM

## 2018-12-16 DIAGNOSIS — M797 Fibromyalgia: Secondary | ICD-10-CM

## 2018-12-16 DIAGNOSIS — M1712 Unilateral primary osteoarthritis, left knee: Secondary | ICD-10-CM

## 2018-12-16 DIAGNOSIS — M26609 Unspecified temporomandibular joint disorder, unspecified side: Secondary | ICD-10-CM

## 2018-12-16 DIAGNOSIS — Z79899 Other long term (current) drug therapy: Secondary | ICD-10-CM

## 2018-12-16 MED ORDER — TIZANIDINE HCL 2 MG PO TABS: 2.0000 mg | ORAL_TABLET | Freq: Three times a day (TID) | ORAL | 5 refills | Status: DC

## 2019-01-13 ENCOUNTER — Encounter: Payer: Self-pay | Admitting: Registered Nurse

## 2019-01-13 ENCOUNTER — Other Ambulatory Visit: Payer: Self-pay

## 2019-01-13 ENCOUNTER — Encounter: Payer: Self-pay | Attending: Physical Medicine & Rehabilitation | Admitting: Registered Nurse

## 2019-01-13 VITALS — BP 135/73 | HR 108 | Temp 98.1°F | Ht 69.0 in | Wt 356.0 lb

## 2019-01-13 DIAGNOSIS — G894 Chronic pain syndrome: Secondary | ICD-10-CM

## 2019-01-13 DIAGNOSIS — Z5181 Encounter for therapeutic drug level monitoring: Secondary | ICD-10-CM

## 2019-01-13 DIAGNOSIS — G43109 Migraine with aura, not intractable, without status migrainosus: Secondary | ICD-10-CM | POA: Insufficient documentation

## 2019-01-13 DIAGNOSIS — M797 Fibromyalgia: Secondary | ICD-10-CM

## 2019-01-13 DIAGNOSIS — M170 Bilateral primary osteoarthritis of knee: Secondary | ICD-10-CM

## 2019-01-13 DIAGNOSIS — Z79891 Long term (current) use of opiate analgesic: Secondary | ICD-10-CM

## 2019-01-13 DIAGNOSIS — S53105S Unspecified dislocation of left ulnohumeral joint, sequela: Secondary | ICD-10-CM

## 2019-01-13 DIAGNOSIS — M47816 Spondylosis without myelopathy or radiculopathy, lumbar region: Secondary | ICD-10-CM

## 2019-01-13 DIAGNOSIS — M26609 Unspecified temporomandibular joint disorder, unspecified side: Secondary | ICD-10-CM | POA: Insufficient documentation

## 2019-01-13 DIAGNOSIS — Z79899 Other long term (current) drug therapy: Secondary | ICD-10-CM | POA: Insufficient documentation

## 2019-01-13 MED ORDER — HYDROMORPHONE HCL 4 MG PO TABS: 4.0000 mg | ORAL_TABLET | ORAL | 0 refills | Status: DC | PRN

## 2019-01-13 MED ORDER — OXYCODONE HCL 5 MG PO TABS: 5.0000 mg | ORAL_TABLET | Freq: Three times a day (TID) | ORAL | 0 refills | Status: DC | PRN

## 2019-01-13 MED ORDER — PREGABALIN 300 MG PO CAPS: 300.0000 mg | ORAL_CAPSULE | Freq: Two times a day (BID) | ORAL | 2 refills | Status: DC

## 2019-01-13 MED ORDER — METHOCARBAMOL 500 MG PO TABS: 500.0000 mg | ORAL_TABLET | Freq: Two times a day (BID) | ORAL | 1 refills | Status: DC | PRN

## 2019-01-13 NOTE — Progress Notes (Signed)
Subjective:    Patient ID: Shannon Meadows, female    DOB: Feb 02, 1970, 48 y.o.   MRN: [id number]  HPI: Shannon Meadows is a 48 y.o. female who returns for follow up appointment for chronic pain and medication refill. She states her pain is located in her left elbow, lower back and bilateral knees. Also reports generalized pain. She rates her pain 7. Her current exercise regime is walking and performing stretching exercises.  [name] reports she's scheduled for orthopedic surgery on January 27, 2019 with her orthopedist, regarding her left elbow in [place], [place].   [name] Morphine equivalent is 118.50  MME.    Last UDS was Performed on 09/16/2018, it was consistent.   Pain Inventory Average Pain 7 Pain Right Now 7 My pain is intermittent, constant, sharp, burning, dull, stabbing, tingling and aching  In the last 24 hours, has pain interfered with the following? General activity 6 Relation with others 6 Enjoyment of life 6 What TIME of day is your pain at its worst? daytime Sleep (in general) Fair  Pain is worse with: bending, inactivity and some activites Pain improves with: rest, heat/ice, therapy/exercise, pacing activities, medication, TENS and injections Relief from Meds: 5  Mobility walk without assistance ability to climb steps?  yes do you drive?  yes  Function not employed: date last employed . Do you have any goals in this area?  yes  Neuro/Psych dizziness  Prior Studies Any changes since last visit?  no  Physicians involved in your care Any changes since last visit?  no   Family History  Problem Relation Age of Onset  . Hyperlipidemia Father   . Hypertension Father   . Arthritis Father   . Heart disease Father   . Asthma Mother   . Diabetes Mother   . Arthritis Mother   . Heart disease Mother   . Heart disease Other   . Lung disease Other   . Diabetes Other   . Hypertension Other    Social History   Socioeconomic History  . Marital status:  Single    Spouse name: Not on file  . Number of children: Not on file  . Years of education: Not on file  . Highest education level: Not on file  Occupational History  . Not on file  Tobacco Use  . Smoking status: Never Smoker  . Smokeless tobacco: Never Used  Substance and Sexual Activity  . Alcohol use: Not on file  . Drug use: Not on file  . Sexual activity: Not on file  Other Topics Concern  . Not on file  Social History Narrative  . Not on file   Social Determinants of Health   Financial Resource Strain:   . Difficulty of Paying Living Expenses: Not on file  Food Insecurity:   . Worried About Charity fundraiser in the Last Year: Not on file  . Ran Out of Food in the Last Year: Not on file  Transportation Needs:   . Lack of Transportation (Medical): Not on file  . Lack of Transportation (Non-Medical): Not on file  Physical Activity:   . Days of Exercise per Week: Not on file  . Minutes of Exercise per Session: Not on file  Stress:   . Feeling of Stress : Not on file  Social Connections:   . Frequency of Communication with Friends and Family: Not on file  . Frequency of Social Gatherings with Friends and Family: Not on file  .  Attends Religious Services: Not on file  . Active Member of Clubs or Organizations: Not on file  . Attends Archivist Meetings: Not on file  . Marital Status: Not on file   Past Surgical History:  Procedure Laterality Date  . KNEE ARTHROSCOPY    . [place]  . ULNAR COLLATERAL LIGAMENT RECONSTRUCTION     Past Medical History:  Diagnosis Date  . Back pain   . Cervicalgia   . Chronic headaches   . Chronic pain syndrome   . Endometriosis   . Facet syndrome, lumbar   . Hyperlipidemia   . Hypertension   . Lumbar sprain   . Migraines   . Myofascial pain   . Neck pain   . TMJ syndrome    BP 135/73   Pulse (!) 108   Temp 98.1 F (36.7 C)   Ht 5\' 9"  (1.753 m)   Wt (!) 356 lb (161.5 kg)   SpO2  94%   BMI 52.57 kg/m   Opioid Risk Score:   Fall Risk Score:  `1  Depression screen PHQ 2/9  Depression screen [facility] 2/9 11/13/2018 05/08/2018 04/19/2015 04/10/2014  Decreased Interest 0 0 0 0  Down, Depressed, Hopeless 0 0 1 1  PHQ - 2 Score 0 0 1 1  Altered sleeping - - - 2  Tired, decreased energy - - - 2  Change in appetite - - - 1  Feeling bad or failure about yourself  - - - 0  Trouble concentrating - - - 0  Moving slowly or fidgety/restless - - - 0  Suicidal thoughts - - - 0  PHQ-9 Score - - - 6     Review of Systems  Constitutional: Negative.   HENT: Negative.   Eyes: Negative.   Respiratory: Negative.   Cardiovascular: Negative.   Gastrointestinal: Positive for nausea.  Endocrine: Negative.   Genitourinary: Negative.   Musculoskeletal: Positive for arthralgias.  Skin: Negative.   Allergic/Immunologic: Negative.   Neurological: Positive for dizziness.  Hematological: Negative.   Psychiatric/Behavioral: Negative.   All other systems reviewed and are negative.      Objective:   Physical Exam Vitals and nursing note reviewed.  Constitutional:      Appearance: Normal appearance.  Cardiovascular:     Rate and Rhythm: Normal rate and regular rhythm.     Pulses: Normal pulses.     Heart sounds: Normal heart sounds.  Pulmonary:     Effort: Pulmonary effort is normal.     Breath sounds: Normal breath sounds.  Musculoskeletal:     Cervical back: Normal range of motion and neck supple.     Comments: Normal Muscle Bulk and Muscle Testing Reveals:  Upper Extremities: Full ROM and Muscle Strength 5/5 Thoracic Paraspinal Tenderness: T-1-T-3  Lower Extremities: Full ROM and Muscle Strength 5/5 Arises from Chair with ease Narrow Based  Gait   Skin:    General: Skin is warm and dry.  Neurological:     Mental Status: She is alert and oriented to person, place, and time.  Psychiatric:        Mood and Affect: Mood normal.        Behavior: Behavior normal.            Assessment & Plan:  1.History of chronic migraines with aura and cervicalgia with myofascial pain.Refilled: Dilaudid 4mg  1- tablet 4 hour as needed for severe pain.#12. Was given a second script.01/13/2019. 2. Lumbar facet disease with  history of disk disease as well. Continue with Exercise and use Heat therapy.01/13/2019 Refilled: Oxycodone 5 mg one tablet every 8 hours as needed #90. Was given a second scriptfor the following month.01/13/2019 4.Primary BilateralOsteoarthritis of the left knee greater than right with meniscal. Chondromalacia patellae changes.Continue to monitor.01/13/2019 5. Right elbow pain: Ortho Following. Schedule for surgery 01/27/2019 she reports.  Continue to monitor. 01/13/2019 7. Chronic Left Shoulder pain: No complaints today. Continue HEP as Tolerated. Continue to Monitor.01/13/2019  15 minutes of face to face patient care time was spent during this visit. All questions were encouraged and answered.   F/U in 2 months

## 2019-01-20 ENCOUNTER — Telehealth: Payer: Self-pay | Admitting: *Deleted

## 2019-01-20 NOTE — Telephone Encounter (Signed)
Shannon Meadows called to report her surgery has been moved to 02/05/19 in [place] and they are wanting to put her put her on oxycodone. She requested something else but they want her on the oxy.  She hopes it will not mess up her refills with our clinic.  FYI.

## 2019-01-20 NOTE — Telephone Encounter (Signed)
Return Ms. Rajan call, no answer left message in regards to her question related to her Oxycodone. She was instructed to call with any questions or concerns.

## 2019-01-27 ENCOUNTER — Telehealth: Payer: Self-pay | Admitting: *Deleted

## 2019-01-27 NOTE — Telephone Encounter (Addendum)
[facility] [place] about Ms Bogdanowicz wanting to fill her oxycodone 5 mg # 20 from Ortho that she was supposed to have for surgery. Her surgery has been moved to the 13th.  Our Rx is there but was DNF before 02/10/19 because last was filled 01/13/19.  [facility] has also called and is wanting to fill her ortho Rx.  They are ok with that but want pain management to weigh in on it.  Her last Rx from [place] is just 14 days into the 30 day cycle and I am not clear why she is wanting to fill the other Rx for #20.  I spoke with the pharmacist and they are reluctant to fill since she should have medication. I put a call in to [place] to ask her why she wants to fill the medication. There was no answer so I requested a call back. She called back and wants to get her medication filled before her surgery.  I have called the pharmacy and lwt them know what the situation is and that she can fill that Rx for her surgery and will be filling our routine Rx for the oxycodone on 02/10/19.

## 2019-02-14 ENCOUNTER — Telehealth: Payer: Self-pay

## 2019-02-14 NOTE — Telephone Encounter (Signed)
Placed a call to Ms. Lodico, she reports she had surgery on 02/05/2019 by Dr. Donnal Debar . [facility] and [facility], Dr  Donnal Debar prescribed Oxycodone 5mg  one tablet every 6 hours #20. Ms. Bezanson was instructed to use her Oxycodone 5 mg tablets she picked up on 02/10/2019 , and to follow Dr. Donnal Debar instructions one tablet every 6 hours #20 for 5  days only. She verbalizes understanding. On 02/20/2019, she will resume the Oxycodone 5 mg one tablet every 8 hours, she verbalizes understanding.

## 2019-02-14 NOTE — Telephone Encounter (Signed)
Patient called stating surgeon sent in a 2nd script for her post op pain and [facility] needs approval to fill.

## 2019-03-10 ENCOUNTER — Other Ambulatory Visit: Payer: Self-pay

## 2019-03-10 ENCOUNTER — Encounter: Payer: Self-pay | Admitting: Registered Nurse

## 2019-03-10 ENCOUNTER — Encounter: Payer: Self-pay | Attending: Physical Medicine & Rehabilitation | Admitting: Registered Nurse

## 2019-03-10 VITALS — BP 147/85 | HR 95 | Temp 98.1°F | Ht 66.0 in | Wt 392.0 lb

## 2019-03-10 DIAGNOSIS — M797 Fibromyalgia: Secondary | ICD-10-CM

## 2019-03-10 DIAGNOSIS — M170 Bilateral primary osteoarthritis of knee: Secondary | ICD-10-CM

## 2019-03-10 DIAGNOSIS — S53105S Unspecified dislocation of left ulnohumeral joint, sequela: Secondary | ICD-10-CM

## 2019-03-10 DIAGNOSIS — Z5181 Encounter for therapeutic drug level monitoring: Secondary | ICD-10-CM

## 2019-03-10 DIAGNOSIS — Z79899 Other long term (current) drug therapy: Secondary | ICD-10-CM | POA: Insufficient documentation

## 2019-03-10 DIAGNOSIS — M26609 Unspecified temporomandibular joint disorder, unspecified side: Secondary | ICD-10-CM | POA: Insufficient documentation

## 2019-03-10 DIAGNOSIS — G894 Chronic pain syndrome: Secondary | ICD-10-CM

## 2019-03-10 DIAGNOSIS — Z79891 Long term (current) use of opiate analgesic: Secondary | ICD-10-CM

## 2019-03-10 DIAGNOSIS — M47816 Spondylosis without myelopathy or radiculopathy, lumbar region: Secondary | ICD-10-CM

## 2019-03-10 DIAGNOSIS — G43109 Migraine with aura, not intractable, without status migrainosus: Secondary | ICD-10-CM

## 2019-03-10 MED ORDER — HYDROMORPHONE HCL 4 MG PO TABS: 4.0000 mg | ORAL_TABLET | ORAL | 0 refills | Status: DC | PRN

## 2019-03-10 MED ORDER — OXYCODONE HCL 5 MG PO TABS: 5.0000 mg | ORAL_TABLET | Freq: Three times a day (TID) | ORAL | 0 refills | Status: DC | PRN

## 2019-03-10 NOTE — Progress Notes (Signed)
Subjective:    Patient ID: Shannon Meadows, female    DOB: [birth year], 49 y.o.   MRN: [id number]  HPI: LOLENE Meadows is a 49 y.o. female who returns for follow up appointment for chronic pain and medication refill. She states her pain is located in her left elbow and she woke up with a migraine this morning. She rates her pain 6. Her current exercise regime is walking and she reports she will be attending physical therapy two days a week.   Ms. Brodowski Morphine equivalent is 118.50  MME.  Last UDS was Performed on 09/16/2018, it was consistent.   Pain Inventory Average Pain 6 Pain Right Now 6 My pain is intermittent, constant, sharp, dull, stabbing, tingling and aching  In the last 24 hours, has pain interfered with the following? General activity 6 Relation with others 6 Enjoyment of life 7 What TIME of day is your pain at its worst? daytime Sleep (in general) Fair  Pain is worse with: some activites Pain improves with: rest, heat/ice, pacing activities, medication, TENS and injections Relief from Meds: 5  Mobility walk without assistance ability to climb steps?  yes do you drive?  yes Do you have any goals in this area?  yes  Function not employed: date last employed . Do you have any goals in this area?  yes  Neuro/Psych numbness tingling dizziness  Prior Studies Any changes since last visit?  no  Physicians involved in your care Any changes since last visit?  no   Family History  Problem Relation Age of Onset  . Hyperlipidemia Father   . Hypertension Father   . Arthritis Father   . Heart disease Father   . Asthma Mother   . Diabetes Mother   . Arthritis Mother   . Heart disease Mother   . Heart disease Other   . Lung disease Other   . Diabetes Other   . Hypertension Other    Social History   Socioeconomic History  . Marital status: Single    Spouse name: Not on file  . Number of children: Not on file  . Years of education: Not on file  . Highest  education level: Not on file  Occupational History  . Not on file  Tobacco Use  . Smoking status: Never Smoker  . Smokeless tobacco: Never Used  Substance and Sexual Activity  . Alcohol use: Not on file  . Drug use: Not on file  . Sexual activity: Not on file  Other Topics Concern  . Not on file  Social History Narrative  . Not on file   Social Determinants of Health   Financial Resource Strain:   . Difficulty of Paying Living Expenses: Not on file  Food Insecurity:   . Worried About Charity fundraiser in the Last Year: Not on file  . Ran Out of Food in the Last Year: Not on file  Transportation Needs:   . Lack of Transportation (Medical): Not on file  . Lack of Transportation (Non-Medical): Not on file  Physical Activity:   . Days of Exercise per Week: Not on file  . Minutes of Exercise per Session: Not on file  Stress:   . Feeling of Stress : Not on file  Social Connections:   . Frequency of Communication with Friends and Family: Not on file  . Frequency of Social Gatherings with Friends and Family: Not on file  . Attends Religious Services: Not on file  . Active  Member of Clubs or Organizations: Not on file  . Attends Archivist Meetings: Not on file  . Marital Status: Not on file   Past Surgical History:  Procedure Laterality Date  . KNEE ARTHROSCOPY    . [place]  . ULNAR COLLATERAL LIGAMENT RECONSTRUCTION     Past Medical History:  Diagnosis Date  . Back pain   . Cervicalgia   . Chronic headaches   . Chronic pain syndrome   . Endometriosis   . Facet syndrome, lumbar   . Hyperlipidemia   . Hypertension   . Lumbar sprain   . Migraines   . Myofascial pain   . Neck pain   . TMJ syndrome    There were no vitals taken for this visit.  Opioid Risk Score:   Fall Risk Score:  `1  Depression screen PHQ 2/9  Depression screen [facility] 2/9 11/13/2018 05/08/2018 04/19/2015 04/10/2014  Decreased Interest 0 0 0 0  Down,  Depressed, Hopeless 0 0 1 1  PHQ - 2 Score 0 0 1 1  Altered sleeping - - - 2  Tired, decreased energy - - - 2  Change in appetite - - - 1  Feeling bad or failure about yourself  - - - 0  Trouble concentrating - - - 0  Moving slowly or fidgety/restless - - - 0  Suicidal thoughts - - - 0  PHQ-9 Score - - - 6     Review of Systems  Constitutional: Negative.   HENT: Negative.   Eyes: Negative.   Respiratory: Negative.   Cardiovascular: Positive for leg swelling.  Gastrointestinal: Positive for nausea.  Endocrine: Negative.   Genitourinary: Negative.   Musculoskeletal: Positive for arthralgias, back pain and gait problem.  Skin: Negative.   Neurological: Positive for dizziness and numbness.       Tingling   Hematological: Negative.   Psychiatric/Behavioral: Negative.   All other systems reviewed and are negative.      Objective:   Physical Exam Vitals and nursing note reviewed.  Constitutional:      Appearance: Normal appearance. She is obese.  Cardiovascular:     Rate and Rhythm: Normal rate and regular rhythm.     Pulses: Normal pulses.     Heart sounds: Normal heart sounds.  Pulmonary:     Effort: Pulmonary effort is normal.     Breath sounds: Normal breath sounds.  Musculoskeletal:     Cervical back: Normal range of motion and neck supple.     Comments: Normal Muscle Bulk and Muscle Testing Reveals:  Upper Extremities: Full ROM and Muscle Strength 5/5  Lumbar Paraspinal Tenderness: L-4-L-5 Lower Extremities: Full ROM and Muscle Strength 5/5 Arises from Table Slowly Narrow Based Gait   Skin:    General: Skin is warm and dry.  Neurological:     Mental Status: She is alert and oriented to person, place, and time.  Psychiatric:        Mood and Affect: Mood normal.        Behavior: Behavior normal.           Assessment & Plan:  1.History of chronic migraines with aura and cervicalgia with myofascial pain.Refilled: Dilaudid 4mg  1- tablet 4 hour as needed  for severe pain.#12. Was given a second script.03/10/2019. 2. Lumbar facet disease with history of disk disease as well. Continue with Exercise and use Heat therapy.03/10/2019 Refilled: Oxycodone 5 mg one tablet every 8 hours as needed #90. Was  given a second scriptfor the following month.03/10/2019. 3.Primary Bilateral Osteoarthritis of the left knee greater than right with meniscal. Chondromalacia patellae changes.Continue to monitor.03/10/2019. 4. Right elbow pain: Ortho Following. S/P surgery on  01/27/2019. Continue to monitor.03/10/2019. 5. Chronic Left Shoulder pain:No complaints today.Continue HEP as Tolerated. Continue to Monitor.03/10/2019  38minutes of face to face patient care time was spent during this visit. All questions were encouraged and answered.   F/U in 2 months

## 2019-04-29 ENCOUNTER — Other Ambulatory Visit: Payer: Self-pay | Admitting: Physical Medicine & Rehabilitation

## 2019-04-29 DIAGNOSIS — M47816 Spondylosis without myelopathy or radiculopathy, lumbar region: Secondary | ICD-10-CM

## 2019-04-29 DIAGNOSIS — M170 Bilateral primary osteoarthritis of knee: Secondary | ICD-10-CM

## 2019-04-30 NOTE — Telephone Encounter (Signed)
PMP was Reviewed. Lyrica e- scribed today.  Placed a call to Shannon Meadows she's taking her Lyrica as prescribed. She is aware of the above and verbalizes understanding.

## 2019-05-07 ENCOUNTER — Other Ambulatory Visit: Payer: Self-pay

## 2019-05-07 ENCOUNTER — Encounter: Payer: Self-pay | Attending: Physical Medicine & Rehabilitation | Admitting: Physical Medicine & Rehabilitation

## 2019-05-07 ENCOUNTER — Encounter: Payer: Self-pay | Admitting: Physical Medicine & Rehabilitation

## 2019-05-07 VITALS — BP 162/79 | HR 101 | Temp 97.5°F | Ht 69.0 in | Wt 390.0 lb

## 2019-05-07 DIAGNOSIS — Z79899 Other long term (current) drug therapy: Secondary | ICD-10-CM | POA: Insufficient documentation

## 2019-05-07 DIAGNOSIS — Z79891 Long term (current) use of opiate analgesic: Secondary | ICD-10-CM | POA: Insufficient documentation

## 2019-05-07 DIAGNOSIS — M26609 Unspecified temporomandibular joint disorder, unspecified side: Secondary | ICD-10-CM | POA: Insufficient documentation

## 2019-05-07 DIAGNOSIS — G43109 Migraine with aura, not intractable, without status migrainosus: Secondary | ICD-10-CM | POA: Insufficient documentation

## 2019-05-07 DIAGNOSIS — M170 Bilateral primary osteoarthritis of knee: Secondary | ICD-10-CM | POA: Insufficient documentation

## 2019-05-07 DIAGNOSIS — Z5181 Encounter for therapeutic drug level monitoring: Secondary | ICD-10-CM | POA: Insufficient documentation

## 2019-05-07 DIAGNOSIS — G894 Chronic pain syndrome: Secondary | ICD-10-CM | POA: Insufficient documentation

## 2019-05-07 DIAGNOSIS — M7918 Myalgia, other site: Secondary | ICD-10-CM

## 2019-05-07 DIAGNOSIS — M797 Fibromyalgia: Secondary | ICD-10-CM | POA: Insufficient documentation

## 2019-05-07 DIAGNOSIS — M47816 Spondylosis without myelopathy or radiculopathy, lumbar region: Secondary | ICD-10-CM | POA: Insufficient documentation

## 2019-05-07 MED ORDER — OXYCODONE HCL 5 MG PO TABS: 5.0000 mg | ORAL_TABLET | Freq: Three times a day (TID) | ORAL | 0 refills | Status: DC | PRN

## 2019-05-07 MED ORDER — HYDROMORPHONE HCL 4 MG PO TABS: 4.0000 mg | ORAL_TABLET | ORAL | 0 refills | Status: DC | PRN
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
## 2019-05-07 NOTE — Patient Instructions (Signed)
PLEASE FEEL FREE TO CALL OUR OFFICE WITH ANY PROBLEMS OR QUESTIONS ([phone])      

## 2019-05-13 LAB — DRUG TOX MONITOR 1 W/CONF, ORAL FLD
Amphetamines: NEGATIVE ng/mL (ref <10)
Barbiturates: NEGATIVE ng/mL (ref <10)
Benzodiazepines: NEGATIVE ng/mL (ref <0.50)
Buprenorphine: NEGATIVE ng/mL (ref <0.10)
Cocaine: NEGATIVE ng/mL (ref <5.0)
Codeine: NEGATIVE ng/mL (ref <2.5)
Dihydrocodeine: NEGATIVE ng/mL (ref <2.5)
Fentanyl: NEGATIVE ng/mL (ref <0.10)
Heroin Metabolite: NEGATIVE ng/mL (ref <1.0)
Hydrocodone: NEGATIVE ng/mL (ref <2.5)
Hydromorphone: NEGATIVE ng/mL (ref <2.5)
MARIJUANA: NEGATIVE ng/mL (ref <2.5)
MDMA: NEGATIVE ng/mL (ref <10)
Meprobamate: NEGATIVE ng/mL (ref <2.5)
Methadone: NEGATIVE ng/mL (ref <5.0)
Morphine: NEGATIVE ng/mL (ref <2.5)
Nicotine Metabolite: NEGATIVE ng/mL (ref <5.0)
Norhydrocodone: NEGATIVE ng/mL (ref <2.5)
Noroxycodone: 24.7 ng/mL — ABNORMAL HIGH (ref <2.5)
Opiates: POSITIVE ng/mL — AB (ref <2.5)
Oxycodone: 92.8 ng/mL — ABNORMAL HIGH (ref <2.5)
Oxymorphone: NEGATIVE ng/mL (ref <2.5)
Phencyclidine: NEGATIVE ng/mL (ref <10)
Tapentadol: NEGATIVE ng/mL (ref <5.0)
Tramadol: NEGATIVE ng/mL (ref <5.0)
Zolpidem: NEGATIVE ng/mL (ref <5.0)

## 2019-05-13 LAB — DRUG TOX ALC METAB W/CON, ORAL FLD: Alcohol Metabolite: NEGATIVE ng/mL (ref <25)

## 2019-05-19 ENCOUNTER — Telehealth: Payer: Self-pay | Admitting: *Deleted

## 2019-05-19 NOTE — Telephone Encounter (Signed)
Oral swab drug screen was consistent for prescribed medications. Her last dose of hydromorphone was 05/03/19

## 2019-05-28 ENCOUNTER — Telehealth: Payer: Self-pay

## 2019-05-28 NOTE — Telephone Encounter (Signed)
I am sorry, but there really is no reason that she could not serve jury duty

## 2019-05-28 NOTE — Telephone Encounter (Signed)
Patient called stating that she received a jury duty summons and she is requesting a letter to be write to give to the court omitting her from jury duty due to her condition.

## 2019-05-29 NOTE — Telephone Encounter (Signed)
I contacted patient and conveyed Dr. Charm Barges message.  She states that she cannot sit still for more than an hour at a time without having to get up and move around.  She feels it will be too much hardship trying to sit still especially if she is called to trial.

## 2019-05-30 NOTE — Telephone Encounter (Signed)
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 Yet she can't sit for jury duty???? That doesn't add up. i'm sorry

## 2019-05-30 NOTE — Telephone Encounter (Signed)
Contacted patient and delivered Dr. Charm Barges verdict.  Patient verbalized understanding.

## 2019-05-31 ENCOUNTER — Other Ambulatory Visit: Payer: Self-pay | Admitting: Physical Medicine & Rehabilitation

## 2019-05-31 DIAGNOSIS — M26609 Unspecified temporomandibular joint disorder, unspecified side: Secondary | ICD-10-CM

## 2019-05-31 DIAGNOSIS — Z5181 Encounter for therapeutic drug level monitoring: Secondary | ICD-10-CM

## 2019-05-31 DIAGNOSIS — M797 Fibromyalgia: Secondary | ICD-10-CM

## 2019-05-31 DIAGNOSIS — G43109 Migraine with aura, not intractable, without status migrainosus: Secondary | ICD-10-CM

## 2019-05-31 DIAGNOSIS — Z79899 Other long term (current) drug therapy: Secondary | ICD-10-CM

## 2019-05-31 DIAGNOSIS — M1712 Unilateral primary osteoarthritis, left knee: Secondary | ICD-10-CM

## 2019-05-31 DIAGNOSIS — G894 Chronic pain syndrome: Secondary | ICD-10-CM

## 2019-06-01 ENCOUNTER — Other Ambulatory Visit: Payer: Self-pay | Admitting: Physical Medicine & Rehabilitation

## 2019-06-01 DIAGNOSIS — G43109 Migraine with aura, not intractable, without status migrainosus: Secondary | ICD-10-CM

## 2019-07-07 ENCOUNTER — Ambulatory Visit: Payer: Self-pay | Admitting: Registered Nurse

## 2019-07-08 ENCOUNTER — Encounter: Payer: Self-pay | Attending: Physical Medicine & Rehabilitation | Admitting: Registered Nurse

## 2019-07-08 ENCOUNTER — Encounter: Payer: Self-pay | Admitting: Registered Nurse

## 2019-07-08 ENCOUNTER — Encounter: Payer: Self-pay | Admitting: *Deleted

## 2019-07-08 ENCOUNTER — Other Ambulatory Visit: Payer: Self-pay

## 2019-07-08 VITALS — BP 147/90 | HR 107 | Temp 97.7°F | Ht 69.0 in | Wt 382.2 lb

## 2019-07-08 DIAGNOSIS — M797 Fibromyalgia: Secondary | ICD-10-CM

## 2019-07-08 DIAGNOSIS — M26609 Unspecified temporomandibular joint disorder, unspecified side: Secondary | ICD-10-CM

## 2019-07-08 DIAGNOSIS — M170 Bilateral primary osteoarthritis of knee: Secondary | ICD-10-CM | POA: Insufficient documentation

## 2019-07-08 DIAGNOSIS — G43109 Migraine with aura, not intractable, without status migrainosus: Secondary | ICD-10-CM

## 2019-07-08 DIAGNOSIS — G894 Chronic pain syndrome: Secondary | ICD-10-CM | POA: Insufficient documentation

## 2019-07-08 DIAGNOSIS — Z5181 Encounter for therapeutic drug level monitoring: Secondary | ICD-10-CM

## 2019-07-08 DIAGNOSIS — Z79899 Other long term (current) drug therapy: Secondary | ICD-10-CM | POA: Insufficient documentation

## 2019-07-08 DIAGNOSIS — Z79891 Long term (current) use of opiate analgesic: Secondary | ICD-10-CM | POA: Insufficient documentation

## 2019-07-08 DIAGNOSIS — M1712 Unilateral primary osteoarthritis, left knee: Secondary | ICD-10-CM

## 2019-07-08 DIAGNOSIS — G8929 Other chronic pain: Secondary | ICD-10-CM

## 2019-07-08 DIAGNOSIS — M533 Sacrococcygeal disorders, not elsewhere classified: Secondary | ICD-10-CM

## 2019-07-08 DIAGNOSIS — M542 Cervicalgia: Secondary | ICD-10-CM

## 2019-07-08 DIAGNOSIS — M47816 Spondylosis without myelopathy or radiculopathy, lumbar region: Secondary | ICD-10-CM | POA: Insufficient documentation

## 2019-07-08 DIAGNOSIS — M25522 Pain in left elbow: Secondary | ICD-10-CM

## 2019-07-08 MED ORDER — OXYCODONE HCL 5 MG PO TABS: 5.0000 mg | ORAL_TABLET | Freq: Three times a day (TID) | ORAL | 0 refills | Status: DC | PRN

## 2019-07-08 MED ORDER — HYDROMORPHONE HCL 4 MG PO TABS: 4.0000 mg | ORAL_TABLET | ORAL | 0 refills | Status: DC | PRN

## 2019-07-08 NOTE — Progress Notes (Addendum)
Subjective:    Patient ID: Shannon Meadows, female    DOB: 1970/06/11, 49 y.o.   MRN: [id number]  HPI: Shannon Meadows is a 49 y.o. female who returns for follow up appointment for chronic pain and medication refill. She reports she awaken with a headache this morning and states she has neck pain, left elbow pain, sacral pain and bilateral knee pain L>R. She rates her pain 5. current exercise regime is walking and performing stretching exercises.  [name] Morphine equivalent is 118.50 MME.    Last Oral Swab was Performed on 05/07/2019, it was consistent for Oxycodone.   Pain Inventory Average Pain 5 Pain Right Now 5 My pain is intermittent, constant, sharp, burning, dull, stabbing, tingling and aching  In the last 24 hours, has pain interfered with the following? General activity 5 Relation with others 5 Enjoyment of life 5 What TIME of day is your pain at its worst? daytime Sleep (in general) Poor  Pain is worse with: walking, bending, sitting, inactivity, standing and some activites Pain improves with: rest, heat/ice, therapy/exercise, pacing activities, medication and injections Relief from Meds: 5  Mobility walk without assistance how many minutes can you walk? 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  no  Physicians involved in your care Any changes since last visit?  no   Family History  Problem Relation Age of Onset  . Hyperlipidemia Father   . Hypertension Father   . Arthritis Father   . Heart disease Father   . Asthma Mother   . Diabetes Mother   . Arthritis Mother   . Heart disease Mother   . Heart disease Other   . Lung disease Other   . Diabetes Other   . Hypertension Other    Social History   Socioeconomic History  . Marital status: Single    Spouse name: Not on file  . Number of children: Not on file  .  Years of education: Not on file  . Highest education level: Not on file  Occupational History  . Not on file  Tobacco Use  . Smoking status: Never Smoker  . Smokeless tobacco: Never Used  Vaping Use  . Vaping Use: Never used  Substance and Sexual Activity  . Alcohol use: Not Currently  . Drug use: Never  . Sexual activity: Not Currently  Other Topics Concern  . Not on file  Social History Narrative  . Not on file   Social Determinants of Health   Financial Resource Strain:   . Difficulty of Paying Living Expenses:   Food Insecurity:   . Worried About Charity fundraiser in the Last Year:   . Arboriculturist in the Last Year:   Transportation Needs:   . Film/video editor (Medical):   Marland Kitchen Lack of Transportation (Non-Medical):   Physical Activity:   . Days of Exercise per Week:   . Minutes of Exercise per Session:   Stress:   . Feeling of Stress :   Social Connections:   . Frequency of Communication with Friends and Family:   . Frequency of Social Gatherings with Friends and Family:   . Attends Religious Services:   . Active Member of Clubs or Organizations:   . Attends Archivist Meetings:   Marland Kitchen Marital Status:    Past Surgical History:  Procedure Laterality  Date  . KNEE ARTHROSCOPY    . [place]  . ULNAR COLLATERAL LIGAMENT RECONSTRUCTION     Past Medical History:  Diagnosis Date  . Back pain   . Cervicalgia   . Chronic headaches   . Chronic pain syndrome   . Endometriosis   . Facet syndrome, lumbar   . Hyperlipidemia   . Hypertension   . Lumbar sprain   . Migraines   . Myofascial pain   . Neck pain   . TMJ syndrome    BP (!) 147/90   Pulse (!) 107   Temp 97.7 F (36.5 C)   Ht 5\' 9"  (1.753 m)   Wt (!) 382 lb 3.2 oz (173.4 kg)   SpO2 95%   BMI 56.44 kg/m   Opioid Risk Score:   Fall Risk Score:  `1  Depression screen PHQ 2/9  Depression screen [facility] 2/9 11/13/2018 05/08/2018 04/19/2015 04/10/2014  Decreased  Interest 0 0 0 0  Down, Depressed, Hopeless 0 0 1 1  PHQ - 2 Score 0 0 1 1  Altered sleeping - - - 2  Tired, decreased energy - - - 2  Change in appetite - - - 1  Feeling bad or failure about yourself  - - - 0  Trouble concentrating - - - 0  Moving slowly or fidgety/restless - - - 0  Suicidal thoughts - - - 0  PHQ-9 Score - - - 6    Review of Systems  Constitutional: Negative.   HENT: Negative.   Eyes: Negative.   Respiratory: Negative.   Cardiovascular: Negative.   Gastrointestinal: Positive for abdominal pain.       Nausea  Endocrine: Negative.   Musculoskeletal: Negative.        Spasms  Skin: Negative.   Neurological: Positive for dizziness.       Tingling  Psychiatric/Behavioral: Negative.        Objective:   Physical Exam Vitals and nursing note reviewed.  Constitutional:      Appearance: Normal appearance.  Cardiovascular:     Rate and Rhythm: Normal rate and regular rhythm.     Pulses: Normal pulses.     Heart sounds: Normal heart sounds.  Pulmonary:     Effort: Pulmonary effort is normal.     Breath sounds: Normal breath sounds.  Musculoskeletal:     Cervical back: Normal range of motion and neck supple.     Comments: Normal Muscle Bulk and Muscle Testing Reveals:  Upper Extremities: Full ROM and Muscle Strength 5/5  Sacral Tenderness  Lower Extremities: Decreased ROM and Muscle Strength 5/5 Bilateral Lower Extremities Flexion Produces Pain into her bilateral Patella's L>R Arises from Table with ease Narrow Based  Gait   Skin:    General: Skin is warm and dry.  Neurological:     Mental Status: She is alert and oriented to person, place, and time.  Psychiatric:        Mood and Affect: Mood normal.        Behavior: Behavior normal.           Assessment & Plan:  1.History of chronic migraines with aura and cervicalgia with myofascial pain.Refilled: Dilaudid 4mg  1- tablet 4 hour as needed for severe pain.#12. Was given a second  script.07/08/2019. 2. Lumbar facet disease with history of disk disease as well. Continue with Exercise and use Heat therapy.07/08/2019 Refilled: Oxycodone 5 mg one tablet every 8 hours as needed #90. Was given a  second scriptfor the following month.07/08/2019. 3.Primary Bilateral Osteoarthritis of the left knee greater than right with meniscal. Chondromalacia patellae changes.Continue to monitor.07/08/2019. 4. Left elbow pain: Ortho Following.S/P surgery on  01/27/2019.Continue to monitor.07/08/2019. 5. Chronic Left Shoulder pain:No complaints today.Continue HEP as Tolerated. Continue to Monitor.07/08/2019 6. Sacral Pain: Continue HEP as Tolerated. Continue to Monitor. Continue current medication regimen.   42minutes of face to face patient care time was spent during this visit. All questions were encouraged and answered.   F/U in 2 months

## 2019-07-08 NOTE — Telephone Encounter (Signed)
error 

## 2019-07-10 NOTE — Addendum Note (Signed)
Addended by: Bayard Hugger on: 07/10/2019 04:24 PM   Modules accepted: Level of Service

## 2019-08-19 ENCOUNTER — Telehealth: Payer: Self-pay | Admitting: *Deleted

## 2019-08-19 ENCOUNTER — Encounter: Payer: Self-pay | Admitting: *Deleted

## 2019-08-19 NOTE — Telephone Encounter (Signed)
Placed a call to Shannon Meadows regarding her request for a written letter in regards to her medication. She will place a call to her [place]  Asking fo a formed to be faxed to our office. This provider will place a call to [facility]  [phone], she verbalizes understanding.

## 2019-08-19 NOTE — Telephone Encounter (Signed)
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  She had to submit a urine drug screen and it came up positive for the medications that we prescribe (Dr. Naaman Plummer and Danella Sensing, ANP). She states that her employer requires a letter that states specifically that:   "Use of these medications do no adversely affect the patient's work performance".    She states that she needs this letter within the next couple of days.  He asked [place] to call her and she would provide an e-mail in which we could scan and send the letter to.  I contacted the patient and asked if she had MyChart set up.  She stated 'no'.  I informed the patient that Dr. Naaman Plummer was out on vacation and would not return until the 3rd or 4th of August.  The patient then asked if Danella Sensing, ANP could possibly write such a letter.  I told the patient that I would ask, with no guarantee.  The patient provided me with an e-mail address,  [email or web address].

## 2019-08-19 NOTE — Telephone Encounter (Signed)
Placed a call to [facility] [phone], requesting they would send the information they are requesting via Fax. Spoke with [employer], she will fax the form to the office.

## 2019-08-26 ENCOUNTER — Encounter: Payer: Self-pay | Admitting: Registered Nurse

## 2019-08-26 ENCOUNTER — Encounter: Payer: Managed Care, Other (non HMO) | Attending: Physical Medicine & Rehabilitation | Admitting: Registered Nurse

## 2019-08-26 ENCOUNTER — Other Ambulatory Visit: Payer: Self-pay

## 2019-08-26 VITALS — BP 123/86 | HR 102 | Temp 98.2°F | Ht 69.0 in | Wt 383.0 lb

## 2019-08-26 DIAGNOSIS — Z5181 Encounter for therapeutic drug level monitoring: Secondary | ICD-10-CM | POA: Insufficient documentation

## 2019-08-26 DIAGNOSIS — M542 Cervicalgia: Secondary | ICD-10-CM

## 2019-08-26 DIAGNOSIS — G43109 Migraine with aura, not intractable, without status migrainosus: Secondary | ICD-10-CM | POA: Diagnosis present

## 2019-08-26 DIAGNOSIS — G8929 Other chronic pain: Secondary | ICD-10-CM

## 2019-08-26 DIAGNOSIS — Z79899 Other long term (current) drug therapy: Secondary | ICD-10-CM | POA: Insufficient documentation

## 2019-08-26 DIAGNOSIS — M170 Bilateral primary osteoarthritis of knee: Secondary | ICD-10-CM | POA: Insufficient documentation

## 2019-08-26 DIAGNOSIS — G894 Chronic pain syndrome: Secondary | ICD-10-CM | POA: Diagnosis present

## 2019-08-26 DIAGNOSIS — M26609 Unspecified temporomandibular joint disorder, unspecified side: Secondary | ICD-10-CM | POA: Insufficient documentation

## 2019-08-26 DIAGNOSIS — M797 Fibromyalgia: Secondary | ICD-10-CM | POA: Insufficient documentation

## 2019-08-26 DIAGNOSIS — M25522 Pain in left elbow: Secondary | ICD-10-CM

## 2019-08-26 DIAGNOSIS — M47816 Spondylosis without myelopathy or radiculopathy, lumbar region: Secondary | ICD-10-CM | POA: Insufficient documentation

## 2019-08-26 DIAGNOSIS — Z79891 Long term (current) use of opiate analgesic: Secondary | ICD-10-CM | POA: Insufficient documentation

## 2019-08-26 MED ORDER — HYDROMORPHONE HCL 4 MG PO TABS: 4.0000 mg | ORAL_TABLET | ORAL | 0 refills | Status: DC | PRN

## 2019-08-26 MED ORDER — OXYCODONE HCL 5 MG PO TABS: 5.0000 mg | ORAL_TABLET | Freq: Three times a day (TID) | ORAL | 0 refills | Status: DC | PRN

## 2019-08-26 MED ORDER — PREGABALIN 300 MG PO CAPS: 300.0000 mg | ORAL_CAPSULE | Freq: Two times a day (BID) | ORAL | 2 refills | Status: DC

## 2019-08-26 MED ORDER — AJOVY 225 MG/1.5ML ~~LOC~~ SOAJ: 225.0000 mg | Freq: Once | SUBCUTANEOUS | 0 refills | Status: AC

## 2019-08-26 NOTE — Progress Notes (Signed)
Subjective:    Patient ID: Shannon Meadows, female    DOB: 07-18-1970, 49 y.o.   MRN: [id number]  HPI: Shannon Meadows is a 49 y.o. female who returns for follow up appointment for chronic pain and medication refill. She states she currently has a headache, she also states her  pain is located in her neck, left elbow, lower back and bilateral knees. She rates her pain 6. Her current exercise regime is walking and performing stretching exercises.  Shannon Meadows asked about Aimovig samples, we currently are out of samples of Aimovig, We discussed Ajovy, this provider discussed the above with Dr Naaman Plummer and he was in agreement with Ajovy sample. Shannon Meadows was educated on Ajovy and all questions answered, she was also given patient education handout , she verbalizes understanding.   Shannon Meadows Morphine equivalent is 118.50 MME.    Last Oral Swab was Performed on 05/07/2019, it was consistent.    Pain Inventory Average Pain 6 Pain Right Now 6 My pain is intermittent, sharp, burning, dull, stabbing, tingling and aching  In the last 24 hours, has pain interfered with the following? General activity 5 Relation with others 5 Enjoyment of life 5 What TIME of day is your pain at its worst? daytime Sleep (in general) Poor  Pain is worse with: walking, bending, sitting and some activites Pain improves with: rest, heat/ice, pacing activities, medication and injections Relief from Meds: 6  Mobility how many minutes can you walk? No problem walking. My knees hurts to climb steps. ability to climb steps?  yes do you drive?  yes  Function employed # of hrs/week On Workers Comp. 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  Do you have any goals in this area?  yes  Neuro/Psych No problems in this area  Prior Studies Any changes since last visit?  no  Physicians involved in your care Any changes since last visit?   no   Family History  Problem Relation Age of Onset  . Hyperlipidemia Father   . Hypertension Father   . Arthritis Father   . Heart disease Father   . Asthma Mother   . Diabetes Mother   . Arthritis Mother   . Heart disease Mother   . Heart disease Other   . Lung disease Other   . Diabetes Other   . Hypertension Other    Social History   Socioeconomic History  . Marital status: Single    Spouse name: Not on file  . Number of children: Not on file  . Years of education: Not on file  . Highest education level: Not on file  Occupational History  . Not on file  Tobacco Use  . Smoking status: Never Smoker  . Smokeless tobacco: Never Used  Vaping Use  . Vaping Use: Never used  Substance and Sexual Activity  . Alcohol use: Not Currently  . Drug use: Never  . Sexual activity: Not Currently  Other Topics Concern  . Not on file  Social History Narrative  . Not on file   Social Determinants of Health   Financial Resource Strain:   . Difficulty of Paying Living Expenses:   Food Insecurity:   . Worried About Charity fundraiser in the Last Year:   . Arboriculturist in the Last Year:   Transportation Needs:   . Film/video editor (Medical):   Marland Kitchen  Lack of Transportation (Non-Medical):   Physical Activity:   . Days of Exercise per Week:   . Minutes of Exercise per Session:   Stress:   . Feeling of Stress :   Social Connections:   . Frequency of Communication with Friends and Family:   . Frequency of Social Gatherings with Friends and Family:   . Attends Religious Services:   . Active Member of Clubs or Organizations:   . Attends Archivist Meetings:   Marland Kitchen Marital Status:    Past Surgical History:  Procedure Laterality Date  . KNEE ARTHROSCOPY    . [place]  . ULNAR COLLATERAL LIGAMENT RECONSTRUCTION     Past Medical History:  Diagnosis Date  . Back pain   . Cervicalgia   . Chronic headaches   . Chronic pain syndrome   .  Endometriosis   . Facet syndrome, lumbar   . Hyperlipidemia   . Hypertension   . Lumbar sprain   . Migraines   . Myofascial pain   . Neck pain   . TMJ syndrome    BP 123/86   Pulse (!) 102   Temp 98.2 F (36.8 C)   Ht 5\' 9"  (1.753 m)   Wt (!) 383 lb (173.7 kg)   SpO2 94%   BMI 56.56 kg/m   Opioid Risk Score:   Fall Risk Score:  `1  Depression screen PHQ 2/9  Depression screen [facility] 2/9 11/13/2018 05/08/2018 04/19/2015 04/10/2014  Decreased Interest 0 0 0 0  Down, Depressed, Hopeless 0 0 1 1  PHQ - 2 Score 0 0 1 1  Altered sleeping - - - 2  Tired, decreased energy - - - 2  Change in appetite - - - 1  Feeling bad or failure about yourself  - - - 0  Trouble concentrating - - - 0  Moving slowly or fidgety/restless - - - 0  Suicidal thoughts - - - 0  PHQ-9 Score - - - 6   Review of Systems  Constitutional: Negative.   HENT: Negative.   Eyes: Negative.   Respiratory: Negative.   Cardiovascular: Negative.   Gastrointestinal: Negative.   Endocrine: Negative.   Genitourinary: Negative.   Musculoskeletal: Positive for back pain and neck pain.  Skin: Negative.   Allergic/Immunologic: Negative.   Neurological: Positive for headaches.  Hematological: Negative.   Psychiatric/Behavioral: Negative.        Objective:   Physical Exam Vitals and nursing note reviewed.  Constitutional:      Appearance: Normal appearance.  Cardiovascular:     Rate and Rhythm: Normal rate and regular rhythm.     Pulses: Normal pulses.     Heart sounds: Normal heart sounds.  Pulmonary:     Effort: Pulmonary effort is normal.     Breath sounds: Normal breath sounds.  Musculoskeletal:     Cervical back: Normal range of motion and neck supple.     Comments: Normal Muscle Bulk and Muscle Testing Reveals:  Upper Extremities: Full ROM and Muscle Strength 5/5  Lumbar Paraspinal Tenderness: L-4-L-5 Lower Extremities: Full ROM and Muscle Strength 5/5 Bilateral Lower Extremities Flexion Produces  Pain into her Bilateral Patella's Arises from Table slowly Narrow Based Gait   Skin:    General: Skin is warm and dry.  Neurological:     Mental Status: She is alert and oriented to person, place, and time.  Psychiatric:        Mood and Affect: Mood normal.  Behavior: Behavior normal.           Assessment & Plan:  1.History of chronic migraines with aura and cervicalgia with myofascial pain.Refilled: Dilaudid 4mg  1- tablet 4 hour as needed for severe pain.#12. Was given a second script.Ajovy 225 mg/1.5 ml sample was given and she was educated on the Ajovy and given patient handout  information, she verbalizes understanding. 08/26/2019. 2. Lumbar facet disease with history of disk disease as well. Continue with Exercise and use Heat therapy.08/26/2019 Refilled: Oxycodone 5 mg one tablet every 8 hours as needed #90. Was given a second scriptfor the following month.08/26/2019. 3.Primary Bilateral Osteoarthritis of the left knee greater than right with meniscal. Chondromalacia patellae changes.Continue to monitor. Continue HEP as Tolerated. Continue to Monitor.08/26/2019. 4. Left elbow pain: Ortho Following.S/Psurgery on01/04/2019.Continue to monitor.08/26/2019. 5. Chronic Left Shoulder pain:No complaints today.Continue HEP as Tolerated. Continue to Monitor.08/26/2019 6. Sacral Pain: No complaints today. Continue HEP as Tolerated. Continue to Monitor. Continue current medication regimen. 08/26/2019.  5minutes of face to face patient care time was spent during this visit. All questions were encouraged and answered.   F/U in 2 months

## 2019-09-02 ENCOUNTER — Ambulatory Visit: Payer: Self-pay | Admitting: Registered Nurse

## 2019-09-03 ENCOUNTER — Ambulatory Visit: Payer: Self-pay | Admitting: Registered Nurse

## 2019-09-03 LAB — COMPREHENSIVE METABOLIC PANEL
ALT: 51 U/L — ABNORMAL HIGH (ref 0–33)
AST: 107 U/L — ABNORMAL HIGH (ref 0–32)
Albumin/Globulin Ratio: 1.58 mmol/L (ref 1.00–2.70)
Albumin: 4.1 g/dL (ref 3.5–5.2)
Alk Phosphatase: 82 U/L (ref 35–117)
Anion Gap: 23 mmol/L — ABNORMAL HIGH (ref 2–17)
BUN: 56 mg/dL — ABNORMAL HIGH (ref 6–20)
CO2: 14 mmol/L — ABNORMAL LOW (ref 22–29)
Calcium: 8.5 mg/dL — ABNORMAL LOW (ref 8.6–10.0)
Chloride: 102 mmol/L (ref 98–107)
Creatinine: 6.1 mg/dL — ABNORMAL HIGH (ref 0.5–1.0)
GFR African American: 9 mL/min/{1.73_m2} — ABNORMAL LOW (ref >=90)
GFR Non-African American: 7 mL/min/{1.73_m2} — ABNORMAL LOW (ref >=90)
Globulin: 2.6 g/dL (ref 1.9–4.4)
Glucose: 128 mg/dL — ABNORMAL HIGH (ref 70–99)
OSMOLALITY CALCULATED: 294 mOsm/kg — ABNORMAL HIGH (ref 270–287)
Potassium: 4.1 mmol/L (ref 3.5–5.3)
Sodium: 139 mmol/L (ref 135–145)
Total Bilirubin: 0.4 mg/dL (ref 0.00–1.20)
Total Protein: 6.7 g/dL (ref 6.4–8.3)

## 2019-09-03 LAB — CBC
Hematocrit: 36.4 % (ref 34.0–47.0)
Hemoglobin: 11.1 g/dL — ABNORMAL LOW (ref 11.5–15.7)
MCH: 31.2 pg (ref 27.0–34.5)
MCHC: 30.5 g/dL — ABNORMAL LOW (ref 32.0–36.0)
MCV: 102.2 fL — ABNORMAL HIGH (ref 81.0–99.0)
MPV: 12.8 fL (ref 7.2–13.2)
NRBC Absolute: 0 10*3/uL (ref 0.000–0.012)
NRBC Automated: 0 % (ref 0.0–0.2)
Platelets: 196 10*3/uL (ref 140–440)
RBC: 3.56 x10e6/mcL — ABNORMAL LOW (ref 3.60–5.20)
RDW: 15 % (ref 11.0–16.0)
WBC: 12.6 10*3/uL — ABNORMAL HIGH (ref 3.8–10.6)

## 2019-09-03 LAB — HCG, SERUM, QUALITATIVE: Preg, Serum: POSITIVE

## 2019-09-03 LAB — TROPONIN T: Troponin T: 0.023 ng/mL — ABNORMAL HIGH (ref 0.000–0.010)

## 2019-09-03 LAB — POCT GLUCOSE: POC Glucose: 129 mg/dL — ABNORMAL HIGH (ref 70.0–120.0)

## 2019-09-03 NOTE — ED Provider Notes (Signed)
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 States she may have experienced syncopal episode. (09/03/19 19:08:00).      History of Present Illness   The patient presents with 49yo female with near syncopal episode while at work. Denies chest pain or trouble breathing. Has had mild cough. Denies fevers. She reports that she is a newly diagnosed diabetic. Preceding the episode she got lightheaded and sweaty. BGL was in the 100's after event. .  The onset was just prior to arrival.  The course/duration of symptoms is improving.  The character of symptoms is no pain.  The degree at onset was moderate.  The degree at present is moderate.  Therapy today: none.  Associated symptoms: none.  Additional history: none.        Review of Systems   Constitutional symptoms:  Negative except as documented in HPI.   Skin symptoms:  Negative except as documented in HPI.   Eye symptoms:  Negative except as documented in HPI.   Respiratory symptoms:  Negative except as documented in HPI.   Cardiovascular symptoms:  Negative except as documented in HPI.   Gastrointestinal symptoms:  Negative except as documented in HPI.   Genitourinary symptoms:  Negative except as documented in HPI.   Musculoskeletal symptoms:  Negative except as documented in HPI.   Neurologic symptoms:  Negative except as documented in HPI.   Psychiatric symptoms:  Negative except as documented in HPI.             Additional review of systems information: All other systems reviewed and  otherwise negative.      Health Status   Allergies:    Allergic Reactions (Selected)  Severe  Penicillin- Anaphylactic reaction.  Thorazine- Anaphylactic reaction.  Moderate  Effexor- Htn..      Past Medical/ Family/ Social History   Medical history: Reviewed as documented in chart.   Surgical history: Reviewed as documented in chart.   Family history: Not significant.   Social history: Reviewed as documented in chart.   Problem list:    Active Problems (4)  Diabetes   Fibromyalgia   Hypertension   Migraine   .      Physical Examination               Vital Signs   Vital Signs   2/70/6237 62:83 EDT Systolic Blood Pressure 151 mmHg    Diastolic Blood Pressure 761 mmHg  >HHI    Temperature Oral 36.7 degC    Heart Rate Monitored 74 bpm    Respiratory Rate 20 br/min    SpO2 93 %   .   Measurements   09/03/2019 19:14 EDT Body Mass Index est meas 6,073.71 kg/m2   09/03/2019 19:14 EDT Body Mass  Index Measured 5,466.27 kg/m2   09/03/2019 19:08 EDT Height/Length Measured 17.3 cm    Weight Dosing 163.6 kg   .   Basic Oxygen Information   09/03/2019 19:08 EDT Oxygen Therapy Room air    SpO2 93 %   .   General:  Alert, no acute distress.    Skin:  Warm, dry, pink.    Head:  Normocephalic.   Neck:  Supple, trachea midline.    Eye:  Pupils are equal, round and reactive to light, extraocular movements are intact.    Ears, nose, mouth and throat:  Oral mucosa moist.   Cardiovascular:  Regular rate and rhythm.   Respiratory:  Lungs are clear to auscultation, respirations are non-labored.    Back:  Normal range of motion.   Musculoskeletal:  Normal ROM, no tenderness.    Chest wall   Gastrointestinal:  Soft, Nontender, Non distended, Normal bowel sounds, No organomegaly.    Neurological:  Alert and oriented to person, place, time, and situation, No focal neurological deficit observed, normal sensory observed, normal motor observed.    Lymphatics:  No lymphadenopathy.   Psychiatric:  Cooperative, appropriate mood & affect.       Medical  Decision Making   Rationale:  49yo female with near syncope. Plan for labs, pregnancy test, troponin, ekg, cxr. .   Results review:  All Results   09/03/2019 20:36 EDT ED Notes ECG - ED Review    09/03/2019 20:33 EDT NM Pulmonary Perfusion w/Vent + Rebreath  (In Progress)   09/03/2019 20:27 EDT ED Note-Physician General Medical Problem *ED (In Progress)   09/03/2019 20:06 EDT XR Chest 1 View Portable IMAGE (Modified)   09/03/2019 19:08 EDT ED Triage Note  (Modified)   09/03/2019 18:42 EDT Consent Forms     09/03/2019 21:30 EDT Estimated Creatinine Clearance 18.30 mL/min     Body Mass Index est meas 54.66 kg/m2     Body Mass Index Measured 54.66 kg/m2    09/03/2019 21:30 EDT Height/Length Measured 173 cm     Body Mass Index est meas 174    09/03/2019 21:29 EDT Estimated Creatinine Clearance 18.30 mL/min     Body Mass Index est meas 54.66 kg/m2     Body Mass Index Measured 54.66 kg/m2    09/03/2019 21:28 EDT Oxygen Therapy Room air     Heart Rate Monitored 53 bpm  LOW     Respiratory Rate 12 br/min     SpO2 93 %    09/03/2019 20:39 EDT ABG Puncture Site Left radial     Modified Allen Test Normal (<10 sec)     Modified Allen Test Complications No    1/47/8295 20:37 EDT pH Art 7.184  CRIT     pCO2 Art 51.0 mmHg  HI     pO2 Art 93.4 mmHg     HCO3 Art 19.2 mmol/L  LOW     HCO3 Std Art 17.2 mmol/L  NA     Base Excess Art -9.0 mmol/L  LOW     BE ECF Art -9.1 mmol/L  LOW     CO2 Totl Art 19 mmol/L  LOW     Hgb Art 11.0 g/dL     pO2 Corr Art 95.1 mmHg  NA     FiO2 Art 21 %  NA     O2 Sat Art 96.6 %     Oxy Hgb% Art 95.2 %  HI     CO Hgb Art 1.0 %     MetHgb  Art 0.5 %     Deoxy Hgb Art 3.3 %  NA     O2 Cap Art 15.1 mL/dL  NA     O2 Con Art 96.0 mL/dL  LOW     pH Corr Art 4.540  NA     pCO2 Corr Art 51.7 mmHg  NA     Temp Art 37.3 C  NA    09/03/2019 20:31 EDT Lactic Acid Lvl 3.3 mmol/L  CRIT    09/03/2019 20:05 EDT Reg Time ECG Read Date and Time 09/03/2019 19:41     ECG Completed By Joaquim Lai    09/03/2019 20:00 EDT Cardiovascular  Symptoms Dizziness     Skin Color General Usual for ethnicity     Skin Temperature Warm     Skin Moisture General Dry     Level of Consciousness - APVU Verbal     Affect/Behavior Appropriate     Orientation Assessment Oriented x 4     Respirations Unlabored     Respiratory Pattern Regular     Patient Airway Status Patent without support    09/03/2019 19:51 EDT Estimated Creatinine Clearance 28.81 mL/min    09/03/2019 19:38 EDT Hand Right Over the needle 22 gauge      Peripheral IV Line Status: Flushes easily, Good blood return     Peripheral IV Line Care: Aspirated and flushed, Secured     Peripheral IV Site Condition: No complications     Peripheral IV Dressing: Transparent     Peripheral IV Dressing Condition: Dry, Intact     Peripheral IV Activity: First assessment new site     Peripheral IV Number of Attempts: 1    09/03/2019 19:28 EDT WBC 12.6 x10e3/mcL  HI     RBC 3.56 x10e6/mcL  LOW     Hgb 11.1 g/dL  LOW     HCT 98.1 %     MCV 102.2 fL  HI     MCH 31.2 pg     MCHC 30.5 g/dL  LOW     RDW 19.1 %     Platelet 196 x10e3/mcL     MPV 12.8 fL     NRBC Absolute Auto 0.000 x10e3/mcL     NRBC Percent Auto 0.0 %     Sodium Lvl 139 mmol/L     Potassium Lvl 4.1 mmol/L     Chloride 102 mmol/L     CO2 14 mmol/L  LOW     Glucose Random 128 mg/dL  HI     BUN 56 mg/dL  HI     Creatinine Lvl 6.1 mg/dL  HI     AGAP 23 mmol/L  HI     Osmolality Calc 294 mOsm/kg  HI     Calcium Lvl 8.5 mg/dL  LOW     Protein Total 6.7 g/dL     Albumin Lvl 4.1 g/dL     Globulin Calc 2.6 g/dL     AG Ratio Calc 4.78 mmol/L     Alk Phos 82 unit/L     AST 107 unit/L  HI     ALT 51 unit/L  HI     eGFR AA 9 mL/min/1.34m????  LOW     eGFR Non-AA 7 mL/min/1.89m????  LOW     Bili Total 0.40 mg/dL     Ketones Negative     Trop T Quant 0.023 ng/mL  HI     Preg S QL Positive     hCG Qnt 7.1 mIU/mL  HI  09/03/2019 19:23 EDT Glucose POC 129.0 mg/dL  HI    09/03/2019 19:14 EDT Body Mass Index est meas 6,712.45 kg/m2    09/03/2019 19:14 EDT Body Mass Index Measured  5,466.27 kg/m2    09/03/2019 19:08 EDT ED Behavioral Activity Rating Scale 4 - Quiet and awake (normal level of activity)     Pregnancy Status Patient denies     SIRS Possible Source of Infectionn Yes     SIRS Source of Infection None of the above     Last 3 mo, thoughts killing self/others Patient denies     Suspect or Concern for: None     Feels Safe Where Live Yes (Modified)    Oxygen Therapy Room air     Recent Travel History ID No recent travel     Close Contact with COVID-19  ID No     TB Symptom Screen ID No symptoms     C. diff Symptom/History ID Neither of the above     Last 14 days COVID-19 ID No     Height/Length Measured 173 cm (Modified)    Weight Dosing 163.6 kg     Numeric Rating Pain Scale 5 = Moderate pain     Systolic Blood Pressure 809 mmHg     Diastolic Blood Pressure 983 mmHg  >HHI     Temperature Oral 36.7 degC     Heart Rate Monitored 74 bpm     Respiratory Rate 20 br/min     SpO2 93 %    .   Radiology results:  Rad Results (ST)   XR Chest 1 View Portable  ?  09/03/19 20:20:15  Chest AP: 09/03/19    INDICATION:Shortness of breath (SOB).    COMPARISON: None    FINDINGS: No pneumothorax or pleural fluid. Elevated right hemidiaphragm. Mild  diffuse interstitial thickening may be due to interstitial pneumonia or  pulmonary vascular congestion without overt pulmonary edema.. Borderline mildly  enlarged cardiac silhouette. Cardiomediastinal contours are otherwise normal for  AP technique. No acute bony abnormality.    IMPRESSION:  Borderline mildly enlarged heart. Mild diffuse interstitial thickening may be  due to interstitial pneumonia or pulmonary vascular congestion without overt  pulmonary edema.  ?  Signed By: Violet Baldy C-MD  .   Notes:  Critical Care Total time (in minutes), including multiple evaluations for critical illness, family conversations, and chart review, excluding time spent on procedures is: 60.      Reexamination/ Reevaluation   Vital signs   Basic Oxygen Information    09/03/2019 19:08 EDT Oxygen Therapy Room air    SpO2 93 %      Notes: Workup demonstrates acute renal failure, uremia, an anion gap, lactic acidosis, and interstitial edema. She has a mild respiratry component to her metabolic acidosis as well, likely due to uremia and OSA.    I will put her on Bipap, continue IV fluids, and admit to the ICU.    Patient has mild troponin leak. Sat 92%. Will obtain V/Q to try and exclude PE, though this is most likely due to renal excretion and her mild interstitial edema respectively.     [facility] ICU has no beds. I will transfer to [facility] ICU under Dr. Sandi Mariscal. .      Impression and Plan   Diagnosis   Uremia (ICD10-CM N19, Discharge, Medical)   Lactic acidosis (ICD10-CM E87.2, Discharge, Medical)   Acute renal failure (ICD10-CM N17.9, Discharge, Medical)   Interstitial edema (ICD10-CM R60.9, Discharge, Medical)  Respiratory acidosis (ICD10-CM E87.2, Discharge, Medical)   Plan   Condition: Guarded.    Disposition: Discharged: Transfer to [facility]   .    Counseled: Patient.    Signature Line     Electronically Signed on 09/03/2019 09:53 PM EDT   ________________________________________________   Mickel Duhamel, MD, Amada Kingfisher by: Mickel Duhamel, MD, Marijo Conception on 09/03/2019 09:18 PM EDT      Modified by: Mickel Duhamel, MD, Marijo Conception on 09/03/2019 09:53 PM EDT

## 2019-09-03 NOTE — H&P (Signed)
Admission H&P *        Patient:   Alyssa Wilkins, Alyssa Wilkins             MRN: [id number]            FIN: [id number]               Age:   49 years     Sex:  Female     DOB:  07-08-1970   Associated Diagnoses:   None   Author:   Jordan Likes E-MD      Basic Information   Admit information:  Presented to [facility] with syncopal episode. 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 .    Source of history:  Self, Medical record.    Present at bedside:  Medical personnel.    Referral source:  Emergency department.    History limitation:  None.       Chief Complaint   Presented to the ED where she works with a syncopal episode which was preceded by what patient interpreted as feeling hypoglycemic. Glucose checked by coworkers during event was 170s, per the patient.      History of Present Illness   Patient is a 49yo female who lives in [place] with PMH of HTN, fibromyalgia, migraines, and morbid obesity who is a travel nurse working in L&D at [employer] She just came back to work 3 days ago after about 1.5 years off work due to a [employer] comp injury involving dislocation of her elbow with surgical repair. Last week, prior to starting her travel assignment, she was seen for a routine physical and was diagnosed with diabetes based on her HgbA1C result. She has been prediabetic for years. She was not prescribed any new medications. She does report that about a month ago she was given Bactrim for a skin infection but was told to stop taking it after just 2 days as culture results were unremarkable, per her report. At her physical, she was told that her kidney function labs were abnormal and she was actually scheduled to have these rechecked in 2 days. She is unsure what her lab values were.  She has been in her usual state of health but reports having an episode Tuesday where she felt hypoglycemic and felt better after drinking some orange juice. She has had no fever/chills, no weakness, no N/V/D, no  respiratory complaints. When she awoke for her shift this evening, she said she felt bad but thought she was hypoglycemic. She does not actually check her glucose at home, but this has happened before and she typically feels better after eating something. She felt better and went to work before having a syncopal episode.   She was brought to the ED with stable vitals. Her labs were remarkable for an anion gap metabolic acidosis, lactic acidosis, and respiratory acidosis. Her urine pregnancy was positive but HCG was very low and patient reports that she has an IUD and has been abstinent so this was deemed to be a false positive. CXR showed cardiomegaly. She was sent for a VQ scan which showed a low probability for PE.   She takes Diovan as prescribed for her hypertension, lyrica for fibromyalgia, and oxycodone and Dilaudid as needed for migraines. She denies any changes to her medication recently.          Review of Systems   + weakness, dizziness episodes over the past 2 days  10 point ROS otherwise negative as described above      Health Status   Allergies:    Allergic Reactions (Selected)  Severe  Penicillin- Anaphylactic reaction.  Thorazine- Anaphylactic reaction.  Moderate  Effexor- Htn.   Current medications:  (Selected)   Inpatient Medications  Ordered  Isovue-370: 100 mL, IV Contrast, Once  Sodium Chloride 0.9% bolus: 1,000 mL, 2000 mL/hr, IV Piggyback, Once  Sodium Chloride 0.9% bolus: 1,000 mL, 2000 mL/hr, IV Piggyback, Once  Documented Medications  Documented  HYDROmorphone 4 mg oral tablet: 0 Refill(s)  etodolac 400 mg oral tablet: 0 Refill(s)  oxyCODONE 5 mg oral tablet: 0 Refill(s)  pregabalin 300 mg oral capsule: 0 Refill(s)  topiramate 50 mg oral tablet: 0 Refill(s)  valsartan-hydrochlorothiazide 160 mg-12.5 mg oral tablet: 0 Refill(s)   Problem list:    Patient Stated  Diabetes / [id number] / Confirmed  Fibromyalgia / [id number] / Confirmed  Hypertension / [id number] / Confirmed  Migraine /  [id number] / Confirmed      Histories   Past Medical History:    No active or resolved past medical history items have been selected or recorded., HTN  Fibromyalgia  Migraines  Diabetes   Family History:    No family history items have been selected or recorded., Asthma in her mother  CAD and dementia in her father  Alzheimer's in her paternal grandfather   Procedure history:    No active procedure history items have been selected or recorded., Left elbow surgery x 2  Multiple endometrial surgeries  Multiple TMJ surgeries  Left knee arthroplasty   Social History        Social & Psychosocial Habits    No Data Available  .     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        Physical Examination      Vital Signs (last 24 hrs)_____  Last Charted___________  Temp Oral     36.7 degC  (AUG 11 19:08)  Heart Rate Peripheral   L 54bpm  (AUG 11 21:50)  Resp Rate         14 br/min  (AUG 11 21:50)  SBP      112 mmHg  (AUG 11 19:08)  DBP      C 103mmHg  (AUG 11 19:08)  SpO2      92 %  (AUG 11 21:50)  Height      173 cm  (AUG 11 21:30)  BMI      54.66  (AUG 11 21:30)     Measurements from flowsheet : Measurements   09/03/2019 21:30 EDT Body Mass Index est meas 54.66 kg/m2     Body Mass Index Measured 54.66 kg/m2    09/03/2019 21:30 EDT Height/Length Measured 173 cm     Body Mass Index est meas 174    09/03/2019 21:29 EDT Body Mass Index est meas 54.66 kg/m2     Body Mass Index Measured 54.66 kg/m2    09/03/2019 19:14 EDT Body Mass Index est meas 5,466.27 kg/m2    09/03/2019 19:14 EDT Body Mass Index Measured 5,466.27 kg/m2    09/03/2019 19:08 EDT Height/Length Measured 173 cm (Modified)    Weight Dosing 163.6 kg         General: awake, alert, appropriate  HEENT: atraumatic, normocephalic, neck supple  Neuro: GCS 15  Cardiac: S1S2,  RRR, no murmur, trace LE edema  Respiratory: lungs clear, diminished in the bases  GI: obese, soft,  active bowel sounds, nontender  GU: foley in place with clear pale yellow urine draining  Musk: moves all, 5/5 strength  Skin: some scattered abrasions but mostly intact, no noteworthy breakdown or sores  Psych: appropriate mood and affect      Review / Management   Results review:     Labs (Last four charted values)  WBC                  H 12.6 (AUG 11)   Hgb                  L 11.1 (AUG 11)   Hct                  36.4 (AUG 11)   Plt                  196 (AUG 11)   Na                   139 (AUG 11)   K                    4.1 (AUG 11)   CO2                  L 14 (AUG 11)   Cl                   102 (AUG 11)   Cr                   H 6.1 (AUG 11)   BUN                  H 56 (AUG 11)   Glucose Random       H 128 (AUG 11) .      Reason For Exam  Shortness of breath  (SOB)    FINAL REPORT  Chest AP: 09/03/19    INDICATION: "Shortness of breath  (SOB)".    COMPARISON: None    FINDINGS: No pneumothorax or pleural fluid. Elevated right hemidiaphragm. Mild  diffuse interstitial thickening may be due to interstitial pneumonia or  pulmonary vascular congestion without overt pulmonary edema.. Borderline mildly  enlarged cardiac silhouette. Cardiomediastinal contours are otherwise normal for  AP technique. No acute bony abnormality.    IMPRESSION:  Borderline mildly enlarged heart. Mild diffuse interstitial thickening may be  due to interstitial pneumonia or pulmonary vascular congestion without overt  pulmonary edema.    Signature Line  ***** Final *****      Impression and Plan   1. AKI - No new medications, normal PO intake no GI complaints. She states she was alerted that her labs about a week ago suggested some renal dysfunction. Reports normal urine production though minimal urine when I&O cath performed. Will place a foley catheter. Strict I&O. Check urine lytes. Renal ultrasound. Aggressive hydration.   2. Anion gap metabolic acidosis - No ketones. Lactic normalized. Serum osmol, calculate osmolar gap.  3. Lactic acidosis  - Unclear etiology but normalized with fluid administration. Do not suspect sepsis at this point but low threshold for starting antibiotics. Continue fluid resuscitation.  4. NIDDM - Ketones negative, no DKA. Glucose 128. Will check FSBS routinely. Her A1C is 6.4 which is elevated but not impressive.  5. HTN - She takes a  combo HCTZ-Valsartan which will be held due to her AKI. She also had some profound hypotension in the emergency department and was started on Levophed for BP support.   6. Syncope - No PE. Supportive care and continued workup.    Signature Line     Electronically Signed on 09/04/2019 10:08 AM EDT   ________________________________________________   Jordan Likes E-MD               Modified by: Norberto Sorenson STREISEL-NP on 09/03/2019 10:06 PM EDT      Modified by: Norberto Sorenson STREISEL-NP on 09/04/2019 03:24 AM EDT      Modified by: Norberto Sorenson STREISEL-NP on 09/04/2019 03:27 AM EDT      Modified by: Norberto Sorenson STREISEL-NP on 09/04/2019 04:04 AM EDT

## 2019-09-03 NOTE — ED Notes (Signed)
ED Triage Note       ED Secondary Triage Entered On:  09/03/2019 21:50 EDT    Performed On:  09/03/2019 21:47 EDT by Lovell Sheehan, RN, Claudia A               General Information   Barriers to Learning :   None evident   ED Home Meds Section :   Document assessment   [facility] ED Fall Risk Section :   Document assessment   ED Advance Directives Section :   Document assessment   ED Palliative Screen :   N/A (prefilled for <50yo)   Lovell Sheehan RN, Debarah Crape A - 09/03/2019 21:47 EDT   (As Of: 09/03/2019 21:50:13 EDT)   Problems(Active)    Diabetes (SNOMED CT  :053976734 )  Name of Problem:   Diabetes ; Recorder:   FERGUSON, RN, AMBER S; Confirmation:   Confirmed ; Classification:   Patient Stated ; Code:   [id number] ; Contributor System:   PowerChart ; Last Updated:   09/03/2019 19:12 EDT ; Life Cycle Date:   09/03/2019 ; Life Cycle Status:   Active ; Vocabulary:   SNOMED CT        Fibromyalgia (SNOMED CT  :973532992 )  Name of Problem:   Fibromyalgia ; Recorder:   FERGUSON, RN, AMBER S; Confirmation:   Confirmed ; Classification:   Patient Stated ; Code:   [id number] ; Contributor System:   Dietitian ; Last Updated:   09/03/2019 19:13 EDT ; Life Cycle Date:   09/03/2019 ; Life Cycle Status:   Active ; Vocabulary:   SNOMED CT        Hypertension (SNOMED CT  :2229798921 )  Name of Problem:   Hypertension ; Recorder:   FERGUSON, RN, AMBER S; Confirmation:   Confirmed ; Classification:   Patient Stated ; Code:   [id number] ; Contributor System:   Dietitian ; Last Updated:   09/03/2019 19:13 EDT ; Life Cycle Date:   09/03/2019 ; Life Cycle Status:   Active ; Vocabulary:   SNOMED CT        Migraine (SNOMED CT  :48185631 )  Name of Problem:   Migraine ; Recorder:   FERGUSON, RN, AMBER S; Confirmation:   Confirmed ; Classification:   Patient Stated ; Code:   49702637 ; Contributor System:   PowerChart ; Last Updated:   09/03/2019 19:13 EDT ; Life Cycle Date:   09/03/2019 ; Life Cycle Status:   Active ; Vocabulary:   SNOMED CT           Diagnoses(Active)    Dizziness  Date:   09/03/2019 ; Diagnosis Type:   Reason For Visit ; Confirmation:   Complaint of ; Clinical Dx:   Dizziness ; Classification:   Medical ; Clinical Service:   Emergency medicine ; Code:   PNED ; Probability:   0 ; Diagnosis Code:   [id number]             -    Procedure History   (As Of: 09/03/2019 21:50:13 EDT)     Phoebe Perch Fall Risk Assessment Tool   Hx of falling last 3 months ED Fall :   No   Patient confused or disoriented ED Fall :   No   Patient intoxicated or sedated ED Fall :   No   Patient impaired gait ED Fall :   No   Use a mobility assistance device ED Fall :   No   Patient altered elimination ED Fall :  No   [facility] ED Fall Score :   0    Henrine Screws - 09/03/2019 21:47 EDT   ED Advance Directive   Advance Directive :   No   Lovell Sheehan, RN, Debarah Crape A - 09/03/2019 21:47 EDT   Med Hx   Medication List   (As Of: 09/03/2019 21:50:13 EDT)   Normal Order    Sodium Chloride 0.9% intravenous solution Bolus  :   Sodium Chloride 0.9% intravenous solution Bolus ; Status:   Ordered ; Ordered As Mnemonic:   Sodium Chloride 0.9% bolus ; Simple Display Line:   1,000 mL, 2000 mL/hr, IV Piggyback, Once ; Ordering Provider:   Dorena Dew, MD, THOMAS C; Catalog Code:   Sodium Chloride 0.9% ; Order Dt/Tm:   09/03/2019 59:93:57 EDT          Sodium Chloride 0.9% intravenous solution Bolus  :   Sodium Chloride 0.9% intravenous solution Bolus ; Status:   Ordered ; Ordered As Mnemonic:   Sodium Chloride 0.9% bolus ; Simple Display Line:   1,000 mL, 2000 mL/hr, IV Piggyback, Once ; Ordering Provider:   Dorena Dew, MD, THOMAS C; Catalog Code:   Sodium Chloride 0.9% ; Order Dt/Tm:   09/03/2019 20:05:53 EDT          albuterol CFC free 90 mcg/inh Aerosol [chg per inh]  :   albuterol CFC free 90 mcg/inh Aerosol Scientist, clinical (histocompatibility and immunogenetics) per inh] ; Status:   Completed ; Ordered As Mnemonic:   albuterol CFC free 90 mcg/inh inhalation aerosol ; Simple Display Line:   720 mcg, 8 inh, Inhale,  Once ; Ordering Provider:   Dorena Dew, MD, Jesse Sans; Catalog Code:   albuterol ; Order Dt/Tm:   09/03/2019 19:31:25 EDT ; Comment:   *THIS IS A PATIENT SPECIFIC MEDICATION IN YOUR PYXIS MACHINE*          iopamidol 76% Inj Soln 100 mL  :   iopamidol 76% Inj Soln 100 mL ; Status:   Ordered ; Ordered As Mnemonic:   Isovue-370 ; Simple Display Line:   100 mL, IV Contrast, Once ; Ordering Provider:   Dorena Dew, MD, Jesse Sans; Catalog Code:   iopamidol ; Order Dt/Tm:   09/03/2019 19:31:05 EDT          Sodium Chloride 0.9% intravenous solution Bolus  :   Sodium Chloride 0.9% intravenous solution Bolus ; Status:   Completed ; Ordered As Mnemonic:   Sodium Chloride 0.9% bolus ; Simple Display Line:   1,000 mL, 2000 mL/hr, IV Piggyback, Once ; Ordering Provider:   Dorena Dew, MD, THOMAS C; Catalog Code:   Sodium Chloride 0.9% ; Order Dt/Tm:   09/03/2019 19:22:49 EDT            Home Meds    etodolac  :   etodolac ; Status:   Documented ; Ordered As Mnemonic:   etodolac 400 mg oral tablet ; Simple Display Line:   0 Refill(s) ; Catalog Code:   etodolac ; Order Dt/Tm:   09/03/2019 21:49:03 EDT          HYDROmorphone  :   HYDROmorphone ; Status:   Documented ; Ordered As Mnemonic:   HYDROmorphone 4 mg oral tablet ; Simple Display Line:   0 Refill(s) ; Catalog Code:   HYDROmorphone ; Order Dt/Tm:   09/03/2019 21:49:03 EDT          oxyCODONE  :   oxyCODONE ; Status:   Documented ; Ordered As Mnemonic:   oxyCODONE 5 mg oral  tablet ; Simple Display Line:   0 Refill(s) ; Catalog Code:   oxyCODONE ; Order Dt/Tm:   09/03/2019 21:49:03 EDT          pregabalin  :   pregabalin ; Status:   Documented ; Ordered As Mnemonic:   pregabalin 300 mg oral capsule ; Simple Display Line:   0 Refill(s) ; Catalog Code:   pregabalin ; Order Dt/Tm:   09/03/2019 21:49:03 EDT          topiramate  :   topiramate ; Status:   Documented ; Ordered As Mnemonic:   topiramate 50 mg oral tablet ; Simple Display Line:   0 Refill(s) ; Catalog Code:   topiramate ; Order Dt/Tm:    09/03/2019 21:49:03 EDT          valsartan-hydrochlorothiazide  :   valsartan-hydrochlorothiazide ; Status:   Documented ; Ordered As Mnemonic:   valsartan-hydrochlorothiazide 160 mg-12.5 mg oral tablet ; Simple Display Line:   0 Refill(s) ; Catalog Code:   valsartan-hydrochlorothiazide ; Order Dt/Tm:   09/03/2019 21:49:03 EDT

## 2019-09-03 NOTE — ED Notes (Signed)
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 States she may have experienced syncopal episode.   Alyssa Wilkins Mode of Arrival :   Wheelchair   Infectious Disease Documentation :   Document assessment   Temperature Oral :   36.7 degC(Converted to: 98.1 degF)    Heart Rate Monitored :   74 bpm   Respiratory Rate :   20 br/min   Systolic Blood Pressure :   112 mmHg   Diastolic Blood Pressure :   103 mmHg (>HHI)    SpO2 :   93 %   Oxygen Therapy :   Room air   Patient presentation :   None of the above   Chief Complaint or Presentation suggest infection :   Yes   Dosing Weight Obtained By :   Patient stated   Weight Dosing :   163.6 kg(Converted to: 360 lb 11 oz)    FERGUSON, RN, AMBER S - 09/03/2019 19:08 EDT   Height :   173 cm(Converted to: 5 ft 8 in)      Body Mass Index Dosing :   55 kg/m2   Durene Cal A - 09/03/2019 21:28 EDT   Lovell Sheehan, RN, Claudia A - 09/03/2019 21:28 EDT     DCP GENERIC CODE   Tracking Group :   ED [id number] Tracking Group   [place], RN, [place] S - 09/03/2019 19:08 EDT   Tracking Acuity :   2   Lovell Sheehan, RN, Claudia A - 09/03/2019 20:30 EDT     ED General Section :   Document assessment   Pregnancy Status :   Patient denies   ED Allergies Section :   Document assessment   ED Reason for Visit Section :   Document assessment   FERGUSON, RN, AMBER S - 09/03/2019 19:08 EDT   ID Risk Screen Symptoms   Recent Travel History :   No recent travel   Close Contact with COVID-19 ID :   No   Last 14 days COVID-19 ID :   No   TB Symptom Screen :   No symptoms   C. diff Symptom/History ID :   Neither of the above   FERGUSON, RN, AMBER S - 09/03/2019 19:08 EDT   Allergies   (As Of: 09/03/2019 19:14:43 EDT)   Allergies (Active)   Effexor  Estimated Onset  Date:   Unspecified ; Reactions:   HTN ; Created By:   Emelda Fear RN, AMBER S; Reaction Status:   Active ; Category:   Drug ; Substance:   Effexor ; Type:   Allergy ; Severity:   Moderate ; Updated By:   Emelda Fear RN, AMBER S; Reviewed Date:   09/03/2019 19:12 EDT      penicillin  Estimated Onset Date:   Unspecified ; Reactions:   Anaphylactic reaction ; Created By:   Emelda Fear RN, AMBER S; Reaction Status:   Active ; Category:   Drug ; Substance:   penicillin ; Type:   Allergy ; Severity:   Severe ; Updated By:   Emelda Fear RN, AMBER S; Reviewed Date:  09/03/2019 19:11 EDT      Thorazine  Estimated Onset Date:   Unspecified ; Reactions:   Anaphylactic reaction ; Created By:   Emelda Fear RN, AMBER S; Reaction Status:   Active ; Category:   Drug ; Substance:   Thorazine ; Type:   Allergy ; Severity:   Severe ; Updated By:   Chipper Herb, AMBER S; Reviewed Date:   09/03/2019 19:12 EDT        Psycho-Social     Lovell Sheehan, RN, Claudia A - 09/03/2019 21:51 EDT     Last 3 mo, thoughts killing self/others :   Patient denies   Right click within box for Suspected Abuse policy link. :   None   FERGUSON, RN, AMBER S - 09/03/2019 19:08 EDT   Feels Safe Where Live :   Isaac Laud, RN, AMBER S - 09/03/2019 21:07 EDT     ED Reason for Visit   (As Of: 09/03/2019 19:14:43 EDT)   Problems(Active)    Diabetes (SNOMED CT  :287867672 )  Name of Problem:   Diabetes ; Recorder:   FERGUSON, RN, AMBER S; Confirmation:   Confirmed ; Classification:   Patient Stated ; Code:   094709628 ; Contributor System:   PowerChart ; Last Updated:   09/03/2019 19:12 EDT ; Life Cycle Date:   09/03/2019 ; Life Cycle Status:   Active ; Vocabulary:   SNOMED CT        Fibromyalgia (SNOMED CT  :366294765 )  Name of Problem:   Fibromyalgia ; Recorder:   FERGUSON, RN, AMBER S; Confirmation:   Confirmed ; Classification:   Patient Stated ; Code:   465035465 ; Contributor System:   Dietitian ; Last Updated:   09/03/2019 19:13 EDT ; Life Cycle Date:   09/03/2019 ; Life Cycle  Status:   Active ; Vocabulary:   SNOMED CT        Hypertension (SNOMED CT  :6812751700 )  Name of Problem:   Hypertension ; Recorder:   FERGUSON, RN, AMBER S; Confirmation:   Confirmed ; Classification:   Patient Stated ; Code:   1749449675 ; Contributor System:   Dietitian ; Last Updated:   09/03/2019 19:13 EDT ; Life Cycle Date:   09/03/2019 ; Life Cycle Status:   Active ; Vocabulary:   SNOMED CT        Migraine (SNOMED CT  :91638466 )  Name of Problem:   Migraine ; Recorder:   FERGUSON, RN, AMBER S; Confirmation:   Confirmed ; Classification:   Patient Stated ; Code:   59935701 ; Contributor System:   PowerChart ; Last Updated:   09/03/2019 19:13 EDT ; Life Cycle Date:   09/03/2019 ; Life Cycle Status:   Active ; Vocabulary:   SNOMED CT          Diagnoses(Active)    Dizziness  Date:   09/03/2019 ; Diagnosis Type:   Reason For Visit ; Confirmation:   Complaint of ; Clinical Dx:   Dizziness ; Classification:   Medical ; Clinical Service:   Emergency medicine ; Code:   PNED ; Probability:   0 ; Diagnosis Code:   [id number]

## 2019-09-04 LAB — BLOOD GAS, ARTERIAL
BE ECF ART: -9.1 mmol/L — ABNORMAL LOW (ref ?–2.0)
Base Excess, Arterial: -9 mmol/L — ABNORMAL LOW (ref ?–2.0)
Carboxyhgb, Arterial: 1 % (ref 0.0–2.0)
DEOXY HGB ART: 3.3 %
FIO2 Arterial: 21 %
HCO3, Arterial: 17.2 mmol/L
HCO3, Arterial: 19.2 mmol/L — ABNORMAL LOW (ref 22.0–26.0)
HGB, Arterial: 11 g/dL (ref 11.0–17.3)
Methemoglobin, Arterial: 0.5 % (ref 0.4–1.5)
O2 CAP ART: 15.1 mL/dL
O2 Content, Arterial: 14.9 mL/dL — ABNORMAL LOW (ref 15.7–21.6)
O2 Sat, Arterial: 96.6 % (ref 95.0–97.0)
Oxyhemoglobin: 95.2 % — ABNORMAL HIGH (ref 90.0–95.0)
Temperature: 37.3 C
Total CO2, Arterial: 19 mmol/L — ABNORMAL LOW (ref 23–27)
pCO2, Arterial: 51 mmHg — ABNORMAL HIGH (ref 36.0–46.0)
pCO2, Arterial: 51.7 mmHg
pH, Arterial: 7.18
pH, Arterial: 7.184 — CL (ref 7.350–7.450)
pO2, Arterial: 93.4 mmHg (ref 80.0–100.0)
pO2, Arterial: 95.1 mmHg

## 2019-09-04 LAB — POC URINALYSIS, CHEMISTRY
Glucose, UA POC: 100 mg/dL — AB
Leukocytes, UA: NEGATIVE
Nitrate, UA POC: NEGATIVE
Protein, Urine, POC: >=300 — AB
Specific Gravity, Urine, POC: >=1.03 — AB (ref 1.003–1.035)
UROBILIN U POC: 0.2 EU/dL
pH, Urine, POC: 5.5 (ref 4.5–8.0)

## 2019-09-04 LAB — BASIC METABOLIC PANEL
Anion Gap: 11 mmol/L (ref 2–17)
Anion Gap: 21 mmol/L — ABNORMAL HIGH (ref 2–17)
BUN: 35 mg/dL — ABNORMAL HIGH (ref 6–20)
BUN: 51 mg/dL — ABNORMAL HIGH (ref 6–20)
CO2: 14 mmol/L — ABNORMAL LOW (ref 22–29)
CO2: 24 mmol/L (ref 22–29)
Calcium: 7.7 mg/dL — ABNORMAL LOW (ref 8.6–10.0)
Calcium: 7.9 mg/dL — ABNORMAL LOW (ref 8.6–10.0)
Chloride: 104 mmol/L (ref 98–107)
Chloride: 106 mmol/L (ref 98–107)
Creatinine: 2.2 mg/dL — ABNORMAL HIGH (ref 0.5–1.0)
Creatinine: 5.3 mg/dL — ABNORMAL HIGH (ref 0.5–1.0)
GFR African American: 10 mL/min/{1.73_m2} — ABNORMAL LOW (ref >=90)
GFR African American: 30 mL/min/{1.73_m2} — ABNORMAL LOW (ref >=90)
GFR Non-African American: 25 mL/min/{1.73_m2} — ABNORMAL LOW (ref >=90)
GFR Non-African American: 9 mL/min/{1.73_m2} — ABNORMAL LOW (ref >=90)
Glucose: 133 mg/dL — ABNORMAL HIGH (ref 70–99)
Glucose: 146 mg/dL — ABNORMAL HIGH (ref 70–99)
OSMOLALITY CALCULATED: 291 mOsm/kg — ABNORMAL HIGH (ref 270–287)
OSMOLALITY CALCULATED: 294 mOsm/kg — ABNORMAL HIGH (ref 270–287)
Potassium: 3.6 mmol/L (ref 3.5–5.3)
Potassium: 3.9 mmol/L (ref 3.5–5.3)
Sodium: 139 mmol/L (ref 135–145)
Sodium: 141 mmol/L (ref 135–145)

## 2019-09-04 LAB — CBC
Hematocrit: 33.4 % — ABNORMAL LOW (ref 34.0–47.0)
Hemoglobin: 10.2 g/dL — ABNORMAL LOW (ref 11.5–15.7)
MCH: 31.6 pg (ref 27.0–34.5)
MCHC: 30.5 g/dL — ABNORMAL LOW (ref 32.0–36.0)
MCV: 103.4 fL — ABNORMAL HIGH (ref 81.0–99.0)
MPV: 13.1 fL (ref 7.2–13.2)
NRBC Absolute: 0 10*3/uL (ref 0.000–0.012)
NRBC Automated: 0 % (ref 0.0–0.2)
Platelets: 157 10*3/uL (ref 140–440)
RBC: 3.23 x10e6/mcL — ABNORMAL LOW (ref 3.60–5.20)
RDW: 15.1 % (ref 11.0–16.0)
WBC: 11.5 10*3/uL — ABNORMAL HIGH (ref 3.8–10.6)

## 2019-09-04 LAB — IMMATURE CELLS
IMMATURE PLT ABSOLUTE: 15.5 10*3/uL
IMMATURE PLT PERCENT: 10.4 % — ABNORMAL HIGH (ref 1.2–8.6)

## 2019-09-04 LAB — CK
Total CK: 3367 U/L — ABNORMAL HIGH (ref 20–180)
Total CK: 4880 U/L — ABNORMAL HIGH (ref 20–180)

## 2019-09-04 LAB — COMPREHENSIVE METABOLIC PANEL
ALT: 45 U/L — ABNORMAL HIGH (ref 0–33)
AST: 92 U/L — ABNORMAL HIGH (ref 0–32)
Albumin/Globulin Ratio: 1.46 mmol/L (ref 1.00–2.70)
Albumin: 3.5 g/dL (ref 3.5–5.2)
Alk Phosphatase: 76 U/L (ref 35–117)
Anion Gap: 18 mmol/L — ABNORMAL HIGH (ref 2–17)
BUN: 53 mg/dL — ABNORMAL HIGH (ref 6–20)
CO2: 16 mmol/L — ABNORMAL LOW (ref 22–29)
Calcium: 7.4 mg/dL — ABNORMAL LOW (ref 8.6–10.0)
Chloride: 107 mmol/L (ref 98–107)
Creatinine: 5.8 mg/dL — ABNORMAL HIGH (ref 0.5–1.0)
GFR African American: 9 mL/min/{1.73_m2} — ABNORMAL LOW (ref >=90)
GFR Non-African American: 8 mL/min/{1.73_m2} — ABNORMAL LOW (ref >=90)
Globulin: 2.4 g/dL (ref 1.9–4.4)
Glucose: 120 mg/dL — ABNORMAL HIGH (ref 70–99)
OSMOLALITY CALCULATED: 294 mOsm/kg — ABNORMAL HIGH (ref 270–287)
Potassium: 4.3 mmol/L (ref 3.5–5.3)
Sodium: 140 mmol/L (ref 135–145)
Total Bilirubin: 0.3 mg/dL (ref 0.00–1.20)
Total Protein: 5.9 g/dL — ABNORMAL LOW (ref 6.4–8.3)

## 2019-09-04 LAB — ADD ON LAB TEST

## 2019-09-04 LAB — HCG, SERUM, QUALITATIVE: Preg, Serum: NEGATIVE

## 2019-09-04 LAB — POCT GLUCOSE
POC Glucose: 125 mg/dL — ABNORMAL HIGH (ref 70.0–120.0)
POC Glucose: 134 mg/dL — ABNORMAL HIGH (ref 70.0–120.0)
POC Glucose: 93 mg/dL (ref 70.0–120.0)
POC Glucose: 124 mg/dL — ABNORMAL HIGH (ref 70.0–120.0)

## 2019-09-04 LAB — PHOSPHORUS: Phosphorus: 5.8 mg/dL — ABNORMAL HIGH (ref 2.5–4.5)

## 2019-09-04 LAB — LIPID PANEL
Chol/HDL Ratio: 3.8 (ref 0.0–4.4)
Cholesterol: 178 mg/dL (ref 100–200)
HDL: 47 mg/dL — ABNORMAL LOW (ref >=50)
LDL Cholesterol: 95 mg/dL (ref 0.0–100.0)
LDL/HDL Ratio: 2
Triglycerides: 180 mg/dL — ABNORMAL HIGH (ref 0–149)
VLDL: 36 mg/dL (ref 5.0–40.0)

## 2019-09-04 LAB — HEMOGLOBIN A1C
Est. Avg. Glucose, WB: 137
Est. Avg. Glucose-calculated: 151
Hemoglobin A1C: 6.4 % — ABNORMAL HIGH (ref 4.0–6.0)

## 2019-09-04 LAB — HCG, QUANTITATIVE, PREGNANCY
hCG Quant: 5.5 m[IU]/mL — ABNORMAL HIGH (ref 0.0–5.3)
hCG Quant: 7.1 m[IU]/mL — ABNORMAL HIGH (ref 0.0–5.3)

## 2019-09-04 LAB — URINE DRUG SCREEN
Amphetamine Screen, Urine: NEGATIVE
Barbiturate Screen, Ur: NEGATIVE
Benzodiazepine Screen, Urine: NEGATIVE
Cannabinoid Scrn, Ur: NEGATIVE
Cocaine Screen Urine: NEGATIVE
Opiate Screen, Urine: NEGATIVE

## 2019-09-04 LAB — CREATININE, RANDOM URINE: Creatinine, Ur: 107.5 mg/dL (ref 28.0–217.0)

## 2019-09-04 LAB — PREGNANCY, URINE
EXP DATE: 11302022
Pregnancy, Urine: NEGATIVE

## 2019-09-04 LAB — MAGNESIUM: Magnesium: 1.9 mg/dL (ref 1.6–2.6)

## 2019-09-04 LAB — LACTIC ACID
Lactic Acid: 3.3 mmol/L (ref 0.5–2.0)
Lactic Acid: 1.2 mmol/L (ref 0.5–2.0)

## 2019-09-04 LAB — ACETONE: Ketones: NEGATIVE

## 2019-09-04 LAB — OSMOLALITY: Osmolality: 310 mOsm/kg (ref 285–309)

## 2019-09-04 LAB — COVID-19: SARS-CoV-2: NOT DETECTED

## 2019-09-04 LAB — SODIUM, URINE, RANDOM: SODIUM, RANDOM URINE: 74

## 2019-09-04 NOTE — ED Notes (Signed)
ED Patient Education Note     Patient Education Materials Follows:

## 2019-09-04 NOTE — Progress Notes (Signed)
Pharmacy Clinical Interventions - Text       Pharmacy Clinical Interventions Entered On:  09/04/2019 8:18 EDT    Performed On:  09/04/2019 8:18 EDT by Rocco Serene E-PharmD               Interventions   Intervention Type Pharmacy :   Medication Reconciliation/Transition of Care   Clinical Importance Pharmacy :   Potentially minor   Medication Safety Reporting Pharmacy :   Non Medication Event   Pharmacist Intervention Time :   [address]   Rocco Serene E-PharmD - 09/04/2019 8:18 EDT

## 2019-09-04 NOTE — Case Communication (Signed)
CM D/C Planning Assessment Ongoing- Text       CM Admission Assessment Entered On:  09/04/2019 9:49 EDT    Performed On:  09/04/2019 9:35 EDT by Foye Clock, RN, Kristen               CM Admission Assessment   CM Reason for Care Management Referral :   Discharge planning assessment, Insurance/Charity Care   CM Insurance Information :   [place]. Name :   Patient states her insurance is through her RN travel company   CM Prescription Coverage :   Commercial   CM [facility] of [facility]   CM Patient has PCP :   Yes   CM Name of PCP :   [place], [place]   CM Home/Lay Caregiver Name/Relationship :   Boyfriend [name] [phone]   CM Living Situation :   Home with no services   CM Patient Admitted From :   Lives with boyfriend in [place] - currently working as a travel Therapist, sports at [place] :   No   CM Initial Tentative Discharge Plan :   Home   Anticipated Hosp Related Barriers to DC :   No transportation available   CM Home Barriers :   None   CM Progress Note :   09/04/19 KD - admitted to SICU with renal failure, acidosis. CM met with patient at bedside to complete assessment. 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 Patient relays she stays at a local motel while working her 3 shifts, then checks out and drives back home to [place]. Patient states she was staying at the [facility] in [facility], but had checked out just before her final shift for the week. Patient's car is at the OSH and states she is unsure how she will get it at dc. Patient relays she is independent at baseline and when at home, lives with her boyfriend, who is bed bound. [facility] if prescribed any meds at dc and will f/u with her PCP. Patient has contacted her nursing recruiter to request a copy of her ins card.      CM Admission Assessment Complete :   Yes   Foye Clock, RN, Kristen - 09/04/2019  9:35 EDT

## 2019-09-04 NOTE — ED Notes (Signed)
 ED Patient Summary              [facility] Emergency Department  [address] [place] [address]  [phone]  Discharge Instructions (Patient)  _______________________________________     Name: Alyssa Wilkins, Alyssa Wilkins  DOB: 29-May-1970                   MRN: [id number]                   FIN: [id number]%>[id number]  Reason For Visit: Dizziness; DIZZINESS, HYPOGLYCEMIA  Final Diagnosis: Acute renal failure; Interstitial edema; Lactic acidosis; Respiratory acidosis; Uremia     Visit Date: 09/03/2019 18:42:00  Address: [address] [place] [address]  Phone: [phone]     Emergency Department Providers:         Primary Physician:   WILNETTE DEBBY JAYSON Shelvy Gwenn Lionel would like to thank you for allowing us  to assist you with your healthcare needs. The following includes patient education materials and information regarding your injury/illness.     Follow-up Instructions:  You were seen today on an emergency basis. Please contact your primary care doctor for a follow up appointment. If you received a referral to a specialist doctor, it is important you follow-up as instructed.    It is important that you call your follow-up doctor to schedule and confirm the location of your next appointment. Your doctor may practice at multiple locations. The office location of your follow-up appointment may be different to the one written on your discharge instructions.    If you do not have a primary care doctor, please call [phone] for help in finding a [facility] Provider. For help in finding a specialist doctor, please call [phone].    If your condition gets worse before your follow-up with your primary care doctor or specialist, please return to the Emergency Department.      Coronavirus 2019 (COVID-19) Reminders:     Patients age 5 - 60, with parental consent, and patients over age 69 can make an appointment for a COVID-19 vaccine. Patients can contact their [facility] doctors' offices to schedule an appointment to receive the COVID-19 vaccine. Patients who do not have a Florie Shelvy Gwenn physician can call [phone] to schedule vaccination appointments.      Follow Up Appointments:  Primary Care Provider:      Name: PCP,  NONE      Phone:           Printed Prescriptions:    Patient Education Materials:  Discharge Orders       Comment:             Allergy Info: Thorazine; Effexor; penicillin     Medication Information:  [facility] ED Physicians provided you with a complete list of medications post discharge, if you have been instructed to stop taking a medication please ensure you also follow up with this information to your Primary Care Physician.  Unless otherwise noted, patient will continue to take medications as prescribed prior to the Emergency Room visit.  Any specific questions regarding your chronic medications and dosages should be discussed with your physician(s) and pharmacist.          etodolac (etodolac 400 mg oral tablet)  HYDROmorphone (HYDROmorphone 4 mg oral tablet)  oxyCODONE (oxyCODONE 5 mg oral tablet)  pregabalin (pregabalin 300 mg oral capsule)  topiramate (topiramate 50 mg oral tablet)  valsartan-hydrochlorothiazide (valsartan-hydrochlorothiazide 160 mg-12.5 mg oral tablet)      Medications Administered During Visit:              Medication Dose Route   Sodium Chloride 0.9% 1000 mL IV Piggyback   albuterol 720 mcg Inhale   Sodium Chloride 0.9% 1000 mL IV Piggyback   Sodium Chloride 0.9% 1000 mL IV Piggyback   ceftriaxone 1 g IV Piggyback   norEPINEPHrine 250 mL Initial Volume  37.5 mL/hr IV  Jugular, Internal Right   Dextrose 5% in Water 250 mL Initial Volume  37.5 mL/hr IV  Jugular, Internal Right          Major Tests and Procedures:  The following procedures and tests were performed during your ED visit.  COMMON PROCEDURES%>  COMMON PROCEDURES COMMENTS%>          Laboratory Orders  Name Status Details   .Glu POC Completed  Blood, RT, RT - Routine, Collected, 09/03/19 19:23:29 EDT, Nurse collect, 09/03/19 19:23:29 US Robinette, IT1000 POC Login   .IPF Completed Blood, Stat, ST - Stat, 09/04/19 0:31:00 EDT, 09/04/19 0:31:00 EDT, Nurse collect, 09/04/19 0:33:00 US Robinette Budge, RN, Will LABOR, [phone]   .UA POC Completed Urine, RT, RT - Routine, Collected, 09/03/19 23:28:00 EDT, Nurse collect, 09/03/19 23:28:00 US Robinette, RAL POC Login   ABG Completed Blood, Stat, ST - Stat, 09/03/19 19:30:00 EDT, 09/03/19 19:30:00 EDT, Nurse collect, DITTRICH,  THOMAS C-MD, Print label Y/N   Add On Ordered Blood, Stat, ST - Stat, Collected, 09/03/19 20:06:00 EDT, 09/03/19 20:06:00 EDT, DITTRICH,  THOMAS C-MD, Print label Y/N, hcg quantitative, Draw Stat   Add On Ordered Blood, Stat, ST - Stat, Collected, 09/03/19 21:33:00 EDT, 09/03/19 21:33:00 EDT, DITTRICH,  THOMAS C-MD, Print label Y/N, serum ketones, Draw Stat   C Blood Ordered Blood, Peripheral, Stat, ST - Stat, 09/03/19 20:05:00 EDT, Once, 09/03/19 20:31:00 EDT, Nurse collect, 1 of 2   C Blood Ordered Blood, Stat, ST - Stat, 09/03/19 20:05:00 EDT, Once, 09/03/19 20:05:00 EDT, Nurse collect, 2 of 2   C Urine Ordered Urine, Catheterized, Stat, ST - Stat, 09/03/19 21:32:00 EDT, Once, 09/03/19 21:32:00 EDT, Nurse collect, Print label Y/N   CBC Completed Blood, Stat, ST - Stat, 09/03/19 19:22:00 EDT, 09/03/19 19:22:00 EDT, Nurse collect, DITTRICH,  THOMAS C-MD, Print label Y/N   CBC Completed Blood, Stat, ST - Stat, 09/04/19 0:31:00 EDT, 09/04/19 0:31:00 EDT, Nurse collect, Budge, RN, Claudia A, Print label Y/N   CMP Completed Blood, Stat, ST - Stat, 09/03/19 19:22:00 EDT, 09/03/19 19:22:00 EDT, Nurse collect, DITTRICH,  THOMAS C-MD, Print label Y/N   CMP Completed Blood, Stat, ST - Stat, 09/04/19 0:31:00 EDT, 09/04/19 0:31:00 EDT, Nurse collect, Budge, RN, Claudia A, Print label Y/N   COV OnlyGX Completed Nasopharyngeal Swab, Stat, ST - Stat, 09/03/19 19:37:00 EDT, 09/03/19 19:37:00 EDT,  Nurse collect, Budge, RN, Claudia A, Print label Y/N   HCG QT Completed Blood, Stat, ST - Stat, Collected, 09/03/19 19:28:00 EDT [id number], 09/03/19 19:28:00 EDT, Nurse collect, Venous Draw, 09/03/19 20:14:00 EDT, SF CP Login, DITTRICH,  THOMAS C-MD, Print label Y/N, sf_lab_accession_3, sf_lab_accession_3, 4 mL SST/*C*/   Ketone Completed Blood, Stat, ST - Stat, Collected, 09/03/19 19:28:00 EDT [id number], 09/03/19 19:28:00 EDT, Nurse collect, Venous Draw, 09/03/19 21:35:00 EDT, SF CP Login, DITTRICH,  THOMAS C-MD, Print label Y/N, sf_lab_accession_3, sf_lab_accession_3, 4 mL SST/*C*/   Lactic Completed Blood, Stat, ST - Stat, 09/03/19 20:05:00 EDT, 09/03/19 20:05:00 EDT, Nurse collect, DITTRICH,  THOMAS C-MD,  Print label Y/N   Lactic Completed Blood, Stat, ST - Stat, 09/04/19 0:31:00 EDT, 09/04/19 0:31:00 EDT, Nurse collect, Mavis, RN, Claudia A, Print label Y/N   Preg S QL Completed Blood, Stat, ST - Stat, 09/03/19 19:22:00 EDT, 09/03/19 19:23:00 EDT, Nurse collect, DITTRICH,  THOMAS C-MD, Print label Y/N   Trop T Completed Blood, Stat, ST - Stat, 09/03/19 19:22:00 EDT, 09/03/19 19:23:00 EDT, Nurse collect, DITTRICH,  THOMAS C-MD, Print label Y/N   UA Mic wRflx Cul Ordered Urine, Catheterized, Stat, ST - Stat, 09/03/19 21:32:00 EDT, 09/03/19 21:32:00 EDT, Nurse collect               Radiology Orders  Name Status Details   NM Pulmonary Perfusion w/Vent + Rebreath Completed 09/03/19 20:33:00 EDT, STAT 1 hour or less, Reason: Shortness of breath  (SOB), Transport Mode: STRETCHER, pp_set_radiology_subspecialty, [id number], No Score, G1004, MG   XR Chest 1 View Portable Completed 09/03/19 19:39:00 EDT, STAT 1 hour or less, Reason: Shortness of breath  (SOB), Transport Mode: Portable, pp_set_radiology_subspecialty               Patient Care Orders  Name Status Details   DC ISO Order/Icons Ordered 09/03/19 20:58:06 EDT, 09/03/19 20:58:06 EDT   ED Assessment Adult Completed 09/03/19 19:14:44 EDT, 09/03/19 19:14:44 EDT   ED  Secondary Triage Completed 09/03/19 19:14:44 EDT, 09/03/19 19:14:44 EDT   ED Triage Adult Completed 09/03/19 18:42:59 EDT, 09/03/19 18:42:59 EDT   Initiate Urinary Catheter Removal Protocol Ordered 09/04/19 0:53:22 EDT, This message can only be seen by Nursing, it is not visible to Pharmacy, Laboratory, or Radiology., 09/04/19 0:53:22 EDT   Notify Provider Completed 09/03/19 19:37:34 EDT, This message can only be seen by Nursing, it is not visible to Pharmacy, Laboratory, or Radiology., 09/03/19 19:37:34 EDT   POC-Blood Glucose Monitoring Completed 09/03/19 19:22:00 EDT, Once, 09/03/19 19:22:00 EDT   Place Isolation Order Ordered 09/03/19 19:37:34 EDT, 09/03/19 19:37:34 EDT   Transport Patient Ordered 09/03/19 21:48:00 EDT   Urinary Catheter Insertion Completed 09/04/19 0:52:00 EDT, Foley, Accurate UOP in critically ill patient, to gravity drain, Initiate Urinary Cath Removal Protocol       ---------------------------------------------------------------------------------------------------------------------  [facility]) encourages you to self-enroll in the [facility] Patient Portal.  [facility] Patient Portal will allow you to manage your personal health information securely from your own electronic device now and in the future.  To begin your Patient Portal enrollment process, please visit [email or web address] Click on "Sign up now" under [facility].  If you find that you need additional assistance on the [facility] Patient Portal or need a copy of your medical records, please call the [facility] Medical Records Office at [phone].  Comment:

## 2019-09-04 NOTE — Nursing Note (Signed)
Adult Patient History Form-Text       Adult Patient History Entered On:  09/04/2019 10:25 EDT    Performed On:  09/04/2019 10:24 EDT by HILL, RN, JAMEE A               General Info   Patient Identified :   Alyssa Wilkins   Information Given By :   Self   Preferred Mode of Communication :   Verbal   In Charge of News (ICON) Name :   Harrold Donath boyfriend  [phone]   Pregnancy Status :   Patient denies   In Charge of News (ICON) Code :   1844   In Clinical Trial With Signed Consent for Related Condition :   N/A   HILL, RN, JAMEE A - 09/04/2019 10:24 EDT   DC Needs   CM Living Situation :   Home with no services   Home Caregiver Name/Relationship :   Harrold Donath (boyfriend)   Current Living Situation :   [id number]   Anticipated Discharge Needs :   None   HILL, RN, JAMEE A - 09/04/2019 10:24 EDT   Admission Complete   Admission Complete :   Yes   HILL, RN, JAMEE A - 09/04/2019 10:24 EDT

## 2019-09-04 NOTE — Nursing Note (Signed)
Adult Patient History Form-Text       Adult Patient History Entered On:  09/04/2019 7:47 EDT    Performed On:  09/04/2019 7:46 EDT by HILL, RN, JAMEE A               General Info   Patient Identified :   Alyssa Wilkins   Information Given By :   Self   Preferred Mode of Communication :   Verbal   In Charge of News (ICON) Name :   nathan boyfriend   Pregnancy Status :   Patient denies   In Charge of News (ICON) Code :   1844   In Clinical Trial With Signed Consent for Related Condition :   N/A   HILL, RN, JAMEE A - 09/04/2019 7:46 EDT   Problem History   (As Of: 09/04/2019 07:47:40 EDT)   Problems(Active)    Diabetes (SNOMED CT  :960454098 )  Name of Problem:   Diabetes ; Recorder:   FERGUSON, RN, AMBER S; Confirmation:   Confirmed ; Classification:   Patient Stated ; Code:   [id number] ; Contributor System:   PowerChart ; Last Updated:   09/03/2019 19:12 EDT ; Life Cycle Date:   09/03/2019 ; Life Cycle Status:   Active ; Vocabulary:   SNOMED CT        Fibromyalgia (SNOMED CT  :562130865 )  Name of Problem:   Fibromyalgia ; Recorder:   FERGUSON, RN, AMBER S; Confirmation:   Confirmed ; Classification:   Patient Stated ; Code:   [id number] ; Contributor System:   Dietitian ; Last Updated:   09/03/2019 19:13 EDT ; Life Cycle Date:   09/03/2019 ; Life Cycle Status:   Active ; Vocabulary:   SNOMED CT        Hypertension (SNOMED CT  :2841324401 )  Name of Problem:   Hypertension ; Recorder:   FERGUSON, RN, AMBER S; Confirmation:   Confirmed ; Classification:   Patient Stated ; Code:   [id number] ; Contributor System:   Dietitian ; Last Updated:   09/03/2019 19:13 EDT ; Life Cycle Date:   09/03/2019 ; Life Cycle Status:   Active ; Vocabulary:   SNOMED CT        Migraine (SNOMED CT  :03474259 )  Name of Problem:   Migraine ; Recorder:   FERGUSON, RN, AMBER S; Confirmation:   Confirmed ; Classification:   Patient Stated ; Code:   [id number] ; Contributor System:   PowerChart ; Last Updated:   09/03/2019 19:13 EDT ; Life Cycle Date:   09/03/2019 ;  Life Cycle Status:   Active ; Vocabulary:   SNOMED CT          Procedure History        -    Procedure History   (As Of: 09/04/2019 07:47:40 EDT)     ID Risk Screen Symptoms   TB Symptom Screen :   No symptoms   Patient Pregnant :   None of the above   MRSA/VRE Screening :   Admitted to an Critical Care or BMT   HILL, RN, JAMEE A - 09/04/2019 7:46 EDT   Bloodless Medicine   Is Blood Transfusion Acceptable to Patient :   Yes   HILL, RN, JAMEE A - 09/04/2019 7:46 EDT   Nutrition   MST Does Your Current Diet Include :   None   HILL, RN, JAMEE A - 09/04/2019 7:46 EDT   Functional   Sensory Deficits :   Other:  wears rx glasses   ADLs Prior to Admission :   Independent   HILL, RN, JAMEE A - 09/04/2019 7:46 EDT   Spiritual   Faith/Denomination :   None specified   Do you have a concern that you would like to address with a Chaplain? :   No   Do you have any religious/spiritual/cultural beliefs that could impact the way your care is provided? :   No   HILL, RN, JAMEE A - 09/04/2019 7:46 EDT   Harm Screen   Suspect or Concern for: :   None   Feels Safe Where Live :   Yes   Last 3 mo, thoughts killing self/others :   Patient denies   HILL, RN, JAMEE A - 09/04/2019 7:46 EDT   Advance Directive   Advance Directive :   No   HILL, RN, JAMEE A - 09/04/2019 7:46 EDT   Education   Written Language :   [place]   Primary Language :   English   Barriers to Learning :   None evident   HILL, RN, JAMEE A - 09/04/2019 7:46 EDT   Preventative Measures Information   Unit/Room Orientation :   Demonstrates   Environmental Safety :   Demonstrates   Hand Washing :   Demonstrates   Infection Prevention :   Demonstrates   DVT Prophylaxis :   Demonstrates   Isolation Precaution :   Demonstrates, Needs further teaching   HILL, RN, JAMEE A - 09/04/2019 7:46 EDT   DC Needs   CM Living Situation :   Family support   Anticipated Discharge Needs :   None   HILL, RN, JAMEE A - 09/04/2019 7:46 EDT   Valuables and Belongings   Valuables and Belongings   At Bedside :    Clothes, Cell phone, Purse/Wallet, Glasses, Money, all personal effects  and valuables listed on person and will be keeping per self//understanding responsible for all such mentioned //also have car keys with car  left at [employer] as pt is  employer   HILL, RN, Laurena Bering A - 09/04/2019 7:46 EDT   Patient Search Completed :   NA   HILL, RN, JAMEE A - 09/04/2019 7:46 EDT   Admission Complete   Admission Complete :   Yes   HILL, RN, JAMEE A - 09/04/2019 7:46 EDT

## 2019-09-04 NOTE — Progress Notes (Signed)
Chaplaincy Note - Text       Chaplaincy Note Entered On:  09/04/2019 10:01 EDT    Performed On:  09/04/2019 10:01 EDT by Truitt Leep               Chaplaincy Consult   Faith/Denomination :   None specified   Additional Information, Chaplaincy :   Pt. was with members of the IDT when I attempted to check on her. PC services remains available to follow up as needed.   Truitt Leep - 09/04/2019 10:01 EDT

## 2019-09-04 NOTE — Discharge Summary (Signed)
INFORMATION   Discharge Disposition: [facility]   Discharge Location:  [facility] ICU   Discharge Date and Time:  09/04/2019 01:15:00   ED Checkout Date and Time:  09/04/2019 01:15:00     DEPART REASON INCOMPLETE INFORMATION               Depart Action Incomplete Reason   Interactive View/I&O Recently assessed   Patient Education MD Decision to leave incomplete   Follow-up MD Decision to leave incomplete   Patient Understanding MD Decision to leave incomplete               Problems      No Problems Documented              Smoking Status      No Smoking Status Documented         PATIENT EDUCATION INFORMATION  Instructions:          Follow up:            ED PROVIDER DOCUMENTATION     Patient:   Alyssa Wilkins, Alyssa Wilkins             MRN: [id number]            FIN: [id number]               Age:   49 years     Sex:  Female     DOB:  03-29-1970   Associated Diagnoses:   Uremia; Lactic acidosis; Acute renal failure; Interstitial edema; Respiratory acidosis   Author:   Marzetta Board C-MD      Basic Information   Additional information: Chief  Complaint from Nursing Triage Note   Chief Complaint  Chief Complaint: 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 States she may have experienced syncopal episode. (09/03/19 19:08:00).      History of Present Illness   The patient presents with 49yo female with near syncopal episode while at work. Denies chest pain or trouble breathing. Has had mild cough. Denies fevers. She reports that she is a newly diagnosed diabetic. Preceding the episode she got lightheaded and sweaty. BGL was in the 100's after event. .  The onset was just prior to arrival.  The course/duration of symptoms is improving.  The character of symptoms is no pain.  The degree at onset was moderate.  The degree at present is moderate.  Therapy today: none.  Associated symptoms: none.  Additional history: none.        Review of Systems   Constitutional symptoms:  Negative except as documented in HPI.   Skin symptoms:  Negative except as documented in HPI.   Eye symptoms:  Negative except as documented in HPI.   Respiratory symptoms:  Negative except as documented in HPI.   Cardiovascular symptoms:  Negative except as documented in HPI.   Gastrointestinal symptoms:  Negative except as documented in HPI.   Genitourinary symptoms:  Negative except as documented in HPI.   Musculoskeletal symptoms:  Negative except as documented in HPI.   Neurologic symptoms:  Negative except as documented in HPI.   Psychiatric symptoms:  Negative except as documented in HPI.             Additional review of systems information: All other systems reviewed and otherwise negative.      Health Status   Allergies:    Allergic Reactions (Selected)  Severe  Penicillin- Anaphylactic reaction.  Thorazine- Anaphylactic reaction.  Moderate  ED Clinical Summary                     [facility]  [address]  [place], [place], [address]  [phone]          PERSON INFORMATION  Name: Alyssa Wilkins Age:  44 Years DOB: 02-26-1970   Sex: Female Language: English PCP: PCP,  NONE   Marital Status: Single Phone: [phone] Med Service: MED-Medicine   MRN: [id number] Acct# [id number] Arrival: 09/03/2019 18:42:00   Visit Reason: Dizziness; DIZZINESS, HYPOGLYCEMIA Acuity: 2 LOS: 000 06:33   Address:    [address] [facility] [place] [address]   Diagnosis:    Acute renal failure; Interstitial edema; Lactic acidosis; Respiratory acidosis; Uremia  Medications:          Medications that have not changed  Other Medications  etodolac (etodolac 400 mg oral tablet)   Last Dose:____________________  HYDROmorphone (HYDROmorphone 4 mg oral tablet)   Last Dose:____________________  oxyCODONE (oxyCODONE 5 mg oral tablet)   Last Dose:____________________  pregabalin (pregabalin 300 mg oral capsule)   Last Dose:____________________  topiramate (topiramate 50 mg oral tablet)   Last Dose:____________________  valsartan-hydrochlorothiazide (valsartan-hydrochlorothiazide 160 mg-12.5 mg oral tablet)   Last Dose:____________________      Medications Administered During Visit:                Medication Dose Route   Sodium Chloride 0.9% 1000 mL IV Piggyback   albuterol 720 mcg Inhale   Sodium Chloride 0.9% 1000 mL IV Piggyback   Sodium Chloride 0.9% 1000 mL IV Piggyback   ceftriaxone 1 g IV Piggyback   norEPINEPHrine 250 mL Initial Volume  37.5 mL/hr IV  Jugular, Internal Right   Dextrose 5% in Water 250 mL Initial Volume  37.5 mL/hr IV  Jugular, Internal Right               Allergies      penicillin (Anaphylactic reaction)      Thorazine (Anaphylactic reaction)      Effexor (HTN)      Major Tests and Procedures:  The following procedures and tests were performed during your ED visit.  COMMON PROCEDURES%>  COMMON PROCEDURES COMMENTS%>                PROVIDER  INFORMATION               Provider Role Assigned Deatra James, RN, Debarah Crape A ED Nurse 09/03/2019 19:19:14    Marzetta Board C-MD ED Provider 09/03/2019 19:22:15        Attending Physician:  Marzetta Board C-MD      Admit Doc  DITTRICH,  THOMAS C-MD     Consulting Doc  Verne Grain     VITALS INFORMATION  Vital Sign Triage Latest   Temp Oral ORAL_1%> ORAL%>   Temp Temporal TEMPORAL_1%> TEMPORAL%>   Temp Intravascular INTRAVASCULAR_1%> INTRAVASCULAR%>   Temp Axillary AXILLARY_1%> AXILLARY%>   Temp Rectal RECTAL_1%> RECTAL%>   02 Sat 93 % 98 %   Respiratory Rate RATE_1%> RATE%>   Peripheral Pulse Rate PULSE RATE_1%>54 bpm PULSE RATE%>   Apical Heart Rate HEART RATE_1%> HEART RATE%>   Blood Pressure BLOOD PRESSURE_1%>/ BLOOD PRESSURE_1%>103 mmHg BLOOD PRESSURE%> / BLOOD PRESSURE%>67 mmHg                 Immunizations      No Immunizations Documented This Visit          DISCHARGE  ED Clinical Summary                     [facility]  [address]  [place], [place], [address]  [phone]          PERSON INFORMATION  Name: Alyssa Wilkins Age:  44 Years DOB: 02-26-1970   Sex: Female Language: English PCP: PCP,  NONE   Marital Status: Single Phone: [phone] Med Service: MED-Medicine   MRN: [id number] Acct# [id number] Arrival: 09/03/2019 18:42:00   Visit Reason: Dizziness; DIZZINESS, HYPOGLYCEMIA Acuity: 2 LOS: 000 06:33   Address:    [address] [facility] [place] [address]   Diagnosis:    Acute renal failure; Interstitial edema; Lactic acidosis; Respiratory acidosis; Uremia  Medications:          Medications that have not changed  Other Medications  etodolac (etodolac 400 mg oral tablet)   Last Dose:____________________  HYDROmorphone (HYDROmorphone 4 mg oral tablet)   Last Dose:____________________  oxyCODONE (oxyCODONE 5 mg oral tablet)   Last Dose:____________________  pregabalin (pregabalin 300 mg oral capsule)   Last Dose:____________________  topiramate (topiramate 50 mg oral tablet)   Last Dose:____________________  valsartan-hydrochlorothiazide (valsartan-hydrochlorothiazide 160 mg-12.5 mg oral tablet)   Last Dose:____________________      Medications Administered During Visit:                Medication Dose Route   Sodium Chloride 0.9% 1000 mL IV Piggyback   albuterol 720 mcg Inhale   Sodium Chloride 0.9% 1000 mL IV Piggyback   Sodium Chloride 0.9% 1000 mL IV Piggyback   ceftriaxone 1 g IV Piggyback   norEPINEPHrine 250 mL Initial Volume  37.5 mL/hr IV  Jugular, Internal Right   Dextrose 5% in Water 250 mL Initial Volume  37.5 mL/hr IV  Jugular, Internal Right               Allergies      penicillin (Anaphylactic reaction)      Thorazine (Anaphylactic reaction)      Effexor (HTN)      Major Tests and Procedures:  The following procedures and tests were performed during your ED visit.  COMMON PROCEDURES%>  COMMON PROCEDURES COMMENTS%>                PROVIDER  INFORMATION               Provider Role Assigned Deatra James, RN, Debarah Crape A ED Nurse 09/03/2019 19:19:14    Marzetta Board C-MD ED Provider 09/03/2019 19:22:15        Attending Physician:  Marzetta Board C-MD      Admit Doc  DITTRICH,  THOMAS C-MD     Consulting Doc  Verne Grain     VITALS INFORMATION  Vital Sign Triage Latest   Temp Oral ORAL_1%> ORAL%>   Temp Temporal TEMPORAL_1%> TEMPORAL%>   Temp Intravascular INTRAVASCULAR_1%> INTRAVASCULAR%>   Temp Axillary AXILLARY_1%> AXILLARY%>   Temp Rectal RECTAL_1%> RECTAL%>   02 Sat 93 % 98 %   Respiratory Rate RATE_1%> RATE%>   Peripheral Pulse Rate PULSE RATE_1%>54 bpm PULSE RATE%>   Apical Heart Rate HEART RATE_1%> HEART RATE%>   Blood Pressure BLOOD PRESSURE_1%>/ BLOOD PRESSURE_1%>103 mmHg BLOOD PRESSURE%> / BLOOD PRESSURE%>67 mmHg                 Immunizations      No Immunizations Documented This Visit          DISCHARGE  ED Clinical Summary                     [facility]  [address]  [place], [place], [address]  [phone]          PERSON INFORMATION  Name: Alyssa Wilkins Age:  44 Years DOB: 02-26-1970   Sex: Female Language: English PCP: PCP,  NONE   Marital Status: Single Phone: [phone] Med Service: MED-Medicine   MRN: [id number] Acct# [id number] Arrival: 09/03/2019 18:42:00   Visit Reason: Dizziness; DIZZINESS, HYPOGLYCEMIA Acuity: 2 LOS: 000 06:33   Address:    [address] [facility] [place] [address]   Diagnosis:    Acute renal failure; Interstitial edema; Lactic acidosis; Respiratory acidosis; Uremia  Medications:          Medications that have not changed  Other Medications  etodolac (etodolac 400 mg oral tablet)   Last Dose:____________________  HYDROmorphone (HYDROmorphone 4 mg oral tablet)   Last Dose:____________________  oxyCODONE (oxyCODONE 5 mg oral tablet)   Last Dose:____________________  pregabalin (pregabalin 300 mg oral capsule)   Last Dose:____________________  topiramate (topiramate 50 mg oral tablet)   Last Dose:____________________  valsartan-hydrochlorothiazide (valsartan-hydrochlorothiazide 160 mg-12.5 mg oral tablet)   Last Dose:____________________      Medications Administered During Visit:                Medication Dose Route   Sodium Chloride 0.9% 1000 mL IV Piggyback   albuterol 720 mcg Inhale   Sodium Chloride 0.9% 1000 mL IV Piggyback   Sodium Chloride 0.9% 1000 mL IV Piggyback   ceftriaxone 1 g IV Piggyback   norEPINEPHrine 250 mL Initial Volume  37.5 mL/hr IV  Jugular, Internal Right   Dextrose 5% in Water 250 mL Initial Volume  37.5 mL/hr IV  Jugular, Internal Right               Allergies      penicillin (Anaphylactic reaction)      Thorazine (Anaphylactic reaction)      Effexor (HTN)      Major Tests and Procedures:  The following procedures and tests were performed during your ED visit.  COMMON PROCEDURES%>  COMMON PROCEDURES COMMENTS%>                PROVIDER  INFORMATION               Provider Role Assigned Deatra James, RN, Debarah Crape A ED Nurse 09/03/2019 19:19:14    Marzetta Board C-MD ED Provider 09/03/2019 19:22:15        Attending Physician:  Marzetta Board C-MD      Admit Doc  DITTRICH,  THOMAS C-MD     Consulting Doc  Verne Grain     VITALS INFORMATION  Vital Sign Triage Latest   Temp Oral ORAL_1%> ORAL%>   Temp Temporal TEMPORAL_1%> TEMPORAL%>   Temp Intravascular INTRAVASCULAR_1%> INTRAVASCULAR%>   Temp Axillary AXILLARY_1%> AXILLARY%>   Temp Rectal RECTAL_1%> RECTAL%>   02 Sat 93 % 98 %   Respiratory Rate RATE_1%> RATE%>   Peripheral Pulse Rate PULSE RATE_1%>54 bpm PULSE RATE%>   Apical Heart Rate HEART RATE_1%> HEART RATE%>   Blood Pressure BLOOD PRESSURE_1%>/ BLOOD PRESSURE_1%>103 mmHg BLOOD PRESSURE%> / BLOOD PRESSURE%>67 mmHg                 Immunizations      No Immunizations Documented This Visit          DISCHARGE  INFORMATION   Discharge Disposition: [facility]   Discharge Location:  [facility] ICU   Discharge Date and Time:  09/04/2019 01:15:00   ED Checkout Date and Time:  09/04/2019 01:15:00     DEPART REASON INCOMPLETE INFORMATION               Depart Action Incomplete Reason   Interactive View/I&O Recently assessed   Patient Education MD Decision to leave incomplete   Follow-up MD Decision to leave incomplete   Patient Understanding MD Decision to leave incomplete               Problems      No Problems Documented              Smoking Status      No Smoking Status Documented         PATIENT EDUCATION INFORMATION  Instructions:          Follow up:            ED PROVIDER DOCUMENTATION     Patient:   Alyssa Wilkins, Alyssa Wilkins             MRN: [id number]            FIN: [id number]               Age:   49 years     Sex:  Female     DOB:  03-29-1970   Associated Diagnoses:   Uremia; Lactic acidosis; Acute renal failure; Interstitial edema; Respiratory acidosis   Author:   Marzetta Board C-MD      Basic Information   Additional information: Chief  Complaint from Nursing Triage Note   Chief Complaint  Chief Complaint: 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 States she may have experienced syncopal episode. (09/03/19 19:08:00).      History of Present Illness   The patient presents with 49yo female with near syncopal episode while at work. Denies chest pain or trouble breathing. Has had mild cough. Denies fevers. She reports that she is a newly diagnosed diabetic. Preceding the episode she got lightheaded and sweaty. BGL was in the 100's after event. .  The onset was just prior to arrival.  The course/duration of symptoms is improving.  The character of symptoms is no pain.  The degree at onset was moderate.  The degree at present is moderate.  Therapy today: none.  Associated symptoms: none.  Additional history: none.        Review of Systems   Constitutional symptoms:  Negative except as documented in HPI.   Skin symptoms:  Negative except as documented in HPI.   Eye symptoms:  Negative except as documented in HPI.   Respiratory symptoms:  Negative except as documented in HPI.   Cardiovascular symptoms:  Negative except as documented in HPI.   Gastrointestinal symptoms:  Negative except as documented in HPI.   Genitourinary symptoms:  Negative except as documented in HPI.   Musculoskeletal symptoms:  Negative except as documented in HPI.   Neurologic symptoms:  Negative except as documented in HPI.   Psychiatric symptoms:  Negative except as documented in HPI.             Additional review of systems information: All other systems reviewed and otherwise negative.      Health Status   Allergies:    Allergic Reactions (Selected)  Severe  Penicillin- Anaphylactic reaction.  Thorazine- Anaphylactic reaction.  Moderate  INFORMATION   Discharge Disposition: [facility]   Discharge Location:  [facility] ICU   Discharge Date and Time:  09/04/2019 01:15:00   ED Checkout Date and Time:  09/04/2019 01:15:00     DEPART REASON INCOMPLETE INFORMATION               Depart Action Incomplete Reason   Interactive View/I&O Recently assessed   Patient Education MD Decision to leave incomplete   Follow-up MD Decision to leave incomplete   Patient Understanding MD Decision to leave incomplete               Problems      No Problems Documented              Smoking Status      No Smoking Status Documented         PATIENT EDUCATION INFORMATION  Instructions:          Follow up:            ED PROVIDER DOCUMENTATION     Patient:   Alyssa Wilkins, Alyssa Wilkins             MRN: [id number]            FIN: [id number]               Age:   49 years     Sex:  Female     DOB:  03-29-1970   Associated Diagnoses:   Uremia; Lactic acidosis; Acute renal failure; Interstitial edema; Respiratory acidosis   Author:   Marzetta Board C-MD      Basic Information   Additional information: Chief  Complaint from Nursing Triage Note   Chief Complaint  Chief Complaint: 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 States she may have experienced syncopal episode. (09/03/19 19:08:00).      History of Present Illness   The patient presents with 49yo female with near syncopal episode while at work. Denies chest pain or trouble breathing. Has had mild cough. Denies fevers. She reports that she is a newly diagnosed diabetic. Preceding the episode she got lightheaded and sweaty. BGL was in the 100's after event. .  The onset was just prior to arrival.  The course/duration of symptoms is improving.  The character of symptoms is no pain.  The degree at onset was moderate.  The degree at present is moderate.  Therapy today: none.  Associated symptoms: none.  Additional history: none.        Review of Systems   Constitutional symptoms:  Negative except as documented in HPI.   Skin symptoms:  Negative except as documented in HPI.   Eye symptoms:  Negative except as documented in HPI.   Respiratory symptoms:  Negative except as documented in HPI.   Cardiovascular symptoms:  Negative except as documented in HPI.   Gastrointestinal symptoms:  Negative except as documented in HPI.   Genitourinary symptoms:  Negative except as documented in HPI.   Musculoskeletal symptoms:  Negative except as documented in HPI.   Neurologic symptoms:  Negative except as documented in HPI.   Psychiatric symptoms:  Negative except as documented in HPI.             Additional review of systems information: All other systems reviewed and otherwise negative.      Health Status   Allergies:    Allergic Reactions (Selected)  Severe  Penicillin- Anaphylactic reaction.  Thorazine- Anaphylactic reaction.  Moderate

## 2019-09-04 NOTE — Nursing Note (Signed)
Adult Patient History Form-Text       Adult Patient History Entered On:  09/04/2019 4:01 EDT    Performed On:  09/04/2019 3:43 EDT by Katrinka Blazing, RN, BARBARA               General Info   Patient Identified :   Identification band, Verbal   Patient Identified :   Alyssa Wilkins   Information Given By :   Self   Preferred Mode of Communication :   Verbal   Accompanied By :   None   In Charge of News (ICON) Name :   Alyssa Wilkins boyfriend   Pregnancy Status :   Patient denies   In Charge of News (ICON) Code :   1844   Has the patient received chemotherapy or immunotherapy (cytotoxic)  in the last 48-72 hours? :   No   In Clinical Trial With Signed Consent for Related Condition :   N/A   Is the patient currently (2-3 days) receiving radiation treatment? :   No   Katrinka Blazing, RN, BARBARA - 09/04/2019 3:43 EDT   Allergies   (As Of: 09/04/2019 04:01:57 EDT)   Allergies (Active)   Effexor  Estimated Onset Date:   Unspecified ; Reactions:   HTN ; Created By:   Emelda Fear RN, AMBER S; Reaction Status:   Active ; Category:   Drug ; Substance:   Effexor ; Type:   Allergy ; Severity:   Moderate ; Updated By:   Emelda Fear RN, AMBER S; Reviewed Date:   09/04/2019 3:51 EDT      penicillin  Estimated Onset Date:   Unspecified ; Reactions:   Anaphylactic reaction ; Created By:   Emelda Fear RN, AMBER S; Reaction Status:   Active ; Category:   Drug ; Substance:   penicillin ; Type:   Allergy ; Severity:   Severe ; Updated By:   Emelda Fear RN, AMBER S; Reviewed Date:   09/04/2019 3:51 EDT      Thorazine  Estimated Onset Date:   Unspecified ; Reactions:   Anaphylactic reaction ; Created By:   Emelda Fear RN, AMBER S; Reaction Status:   Active ; Category:   Drug ; Substance:   Thorazine ; Type:   Allergy ; Severity:   Severe ; Updated By:   Emelda Fear RN, AMBER S; Reviewed Date:   09/04/2019 3:51 EDT        Medication History   Medication List   (As Of: 09/04/2019 04:01:57 EDT)   Normal Order    Sodium Chloride 0.9% intravenous solution 1,000 mL  :   Sodium Chloride 0.9%  intravenous solution 1,000 mL ; Status:   Ordered ; Ordered As Mnemonic:   Sodium Chloride 0.9% 1,000 mL ; Simple Display Line:   100 mL/hr, IV ; Ordering Provider:   Norberto Sorenson STREISEL-NP; Catalog Code:   Sodium Chloride 0.9% ; Order Dt/Tm:   09/04/2019 02:14:56 EDT          norEPINEPHrine 4 mg + premix Dextrose 5% in water..... 250 mL  :   norEPINEPHrine 4 mg + premix Dextrose 5% in water..... 250 mL ; Status:   Ordered ; Ordered As Mnemonic:   norEPINEPHrine IV additive 4 mg + Dextrose 5% in Water 250 mL ; Simple Display Line:   TITRATE, IV ; Ordering Provider:   Dorena Dew, MD, THOMAS C; Catalog Code:   Dextrose 5% in Water ; Order Dt/Tm:   09/04/2019 00:39:29 EDT ; Comment:   Starting dose 53mcg/min. Titrate by 52mcg/min every  5 minutes to maintain SBP > 90 or MAP > 65. Notify MD if pt requires 2450mcg/min. Max dose 5580mcg/min.Central line administration preferred. Protect from light          norEPINEPHrine 4 mg + premix Dextrose 5% in water..... 250 mL  :   norEPINEPHrine 4 mg + premix Dextrose 5% in water..... 250 mL ; Status:   Ordered ; Ordered As Mnemonic:   norEPINEPHrine IV additive 4 mg + Dextrose 5% in Water 250 mL ; Simple Display Line:   TITRATE, IV ; Ordering Provider:   Norberto SorensonJONES,  SANDRA STREISEL-NP; Catalog Code:   Dextrose 5% in Water ; Order Dt/Tm:   09/04/2019 02:16:47 EDT ; Comment:   Starting dose: 2 mcg/min.  Titrate by 2 mcg/min every 5 minutes  Maintain SBP greater than 90, Maintain MAP greater than 65  Notify MD if patient requires > 50 mcg/min; MAX dose 80 mcg/min          cefTRIAXone 1gm/16500ml NS ADM  :   cefTRIAXone 1gm/16700ml NS ADM ; Status:   Completed ; Ordered As Mnemonic:   cefTRIAXone ( Rocephin ) ; Simple Display Line:   1 g, 100 mL, 200 mL/hr, IV Piggyback, Once ; Ordering Provider:   Dorena DewITTRICH, MD, Jesse SansHOMAS C; Catalog Code:   cefTRIAXone ; Order Dt/Tm:   09/03/2019 23:28:26 EDT          iopamidol 76% Inj Soln 100 mL  :   iopamidol 76% Inj Soln 100 mL ; Status:   Completed ; Ordered As  Mnemonic:   Isovue-370 ; Simple Display Line:   100 mL, IV Contrast, Once ; Ordering Provider:   Dorena DewITTRICH, MD, Jesse SansHOMAS C; Catalog Code:   iopamidol ; Order Dt/Tm:   09/03/2019 19:31:05 EDT          heparin 5000 units/mL Inj Soln 1 mL  :   heparin 5000 units/mL Inj Soln 1 mL ; Status:   Ordered ; Ordered As Mnemonic:   heparin ; Simple Display Line:   5,000 units, 1 mL, Subcutaneous, q8hr ; Ordering Provider:   Norberto SorensonJONES,  SANDRA STREISEL-NP; Catalog Code:   heparin ; Order Dt/Tm:   09/04/2019 02:17:14 EDT ; Comment:   HIGH ALERT MEDICATION - USE CAUTION......Marland Kitchen.CBC every other day.Marland Kitchen.Marland Kitchen.Marland Kitchen.Notify MD if platelet <150,000 or drops > 50% from Baseline          albuterol CFC free 90 mcg/inh Aerosol [chg per inh]  :   albuterol CFC free 90 mcg/inh Interior and spatial designerAerosol [chg per inh] ; Status:   Completed ; Ordered As Mnemonic:   albuterol CFC free 90 mcg/inh inhalation aerosol ; Simple Display Line:   720 mcg, 8 inh, Inhale, Once ; Ordering Provider:   Dorena DewITTRICH, MD, Jesse SansHOMAS C; Catalog Code:   albuterol ; Order Dt/Tm:   09/03/2019 19:31:25 EDT ; Comment:   *THIS IS A PATIENT SPECIFIC MEDICATION IN YOUR PYXIS MACHINE*          Sodium Chloride 0.9% intravenous solution Bolus  :   Sodium Chloride 0.9% intravenous solution Bolus ; Status:   Completed ; Ordered As Mnemonic:   Sodium Chloride 0.9% bolus ; Simple Display Line:   1,000 mL, 2000 mL/hr, IV Piggyback, Once ; Ordering Provider:   Dorena DewITTRICH, MD, THOMAS C; Catalog Code:   Sodium Chloride 0.9% ; Order Dt/Tm:   09/03/2019 19:22:49 EDT          Sodium Chloride 0.9% intravenous solution Bolus  :   Sodium Chloride 0.9% intravenous solution Bolus ; Status:  Completed ; Ordered As Mnemonic:   Sodium Chloride 0.9% bolus ; Simple Display Line:   1,000 mL, 2000 mL/hr, IV Piggyback, Once ; Ordering Provider:   Dorena Dew, MD, THOMAS C; Catalog Code:   Sodium Chloride 0.9% ; Order Dt/Tm:   09/03/2019 20:05:53 EDT          Sodium Chloride 0.9% intravenous solution Bolus  :   Sodium Chloride 0.9% intravenous solution  Bolus ; Status:   Completed ; Ordered As Mnemonic:   Sodium Chloride 0.9% bolus ; Simple Display Line:   1,000 mL, 2000 mL/hr, IV Piggyback, Once ; Ordering Provider:   Dorena Dew, MD, THOMAS C; Catalog Code:   Sodium Chloride 0.9% ; Order Dt/Tm:   09/03/2019 24:23:53 EDT            Home Meds    topiramate  :   topiramate ; Status:   Documented ; Ordered As Mnemonic:   topiramate 50 mg oral tablet ; Simple Display Line:   0 Refill(s) ; Catalog Code:   topiramate ; Order Dt/Tm:   09/03/2019 21:49:03 EDT          etodolac  :   etodolac ; Status:   Documented ; Ordered As Mnemonic:   etodolac 400 mg oral tablet ; Simple Display Line:   0 Refill(s) ; Catalog Code:   etodolac ; Order Dt/Tm:   09/03/2019 21:49:03 EDT          HYDROmorphone  :   HYDROmorphone ; Status:   Documented ; Ordered As Mnemonic:   HYDROmorphone 4 mg oral tablet ; Simple Display Line:   0 Refill(s) ; Catalog Code:   HYDROmorphone ; Order Dt/Tm:   09/03/2019 21:49:03 EDT          pregabalin  :   pregabalin ; Status:   Documented ; Ordered As Mnemonic:   pregabalin 300 mg oral capsule ; Simple Display Line:   0 Refill(s) ; Catalog Code:   pregabalin ; Order Dt/Tm:   09/03/2019 21:49:03 EDT          valsartan-hydrochlorothiazide  :   valsartan-hydrochlorothiazide ; Status:   Documented ; Ordered As Mnemonic:   valsartan-hydrochlorothiazide 160 mg-12.5 mg oral tablet ; Simple Display Line:   0 Refill(s) ; Catalog Code:   valsartan-hydrochlorothiazide ; Order Dt/Tm:   09/03/2019 21:49:03 EDT          oxyCODONE  :   oxyCODONE ; Status:   Documented ; Ordered As Mnemonic:   oxyCODONE 5 mg oral tablet ; Simple Display Line:   0 Refill(s) ; Catalog Code:   oxyCODONE ; Order Dt/Tm:   09/03/2019 21:49:03 EDT            Problem History   (As Of: 09/04/2019 04:01:57 EDT)   Problems(Active)    Diabetes (SNOMED CT  :614431540 )  Name of Problem:   Diabetes ; Recorder:   FERGUSON, RN, AMBER S; Confirmation:   Confirmed ; Classification:   Patient Stated ; Code:   [id number]  ; Contributor System:   PowerChart ; Last Updated:   09/03/2019 19:12 EDT ; Life Cycle Date:   09/03/2019 ; Life Cycle Status:   Active ; Vocabulary:   SNOMED CT        Fibromyalgia (SNOMED CT  :932671245 )  Name of Problem:   Fibromyalgia ; Recorder:   FERGUSON, RN, AMBER S; Confirmation:   Confirmed ; Classification:   Patient Stated ; Code:   [id number] ; Contributor System:   Dietitian ; Last  Updated:   09/03/2019 19:13 EDT ; Life Cycle Date:   09/03/2019 ; Life Cycle Status:   Active ; Vocabulary:   SNOMED CT        Hypertension (SNOMED CT  :1324401027 )  Name of Problem:   Hypertension ; Recorder:   FERGUSON, RN, AMBER S; Confirmation:   Confirmed ; Classification:   Patient Stated ; Code:   [id number] ; Contributor System:   Dietitian ; Last Updated:   09/03/2019 19:13 EDT ; Life Cycle Date:   09/03/2019 ; Life Cycle Status:   Active ; Vocabulary:   SNOMED CT        Migraine (SNOMED CT  :74259563 )  Name of Problem:   Migraine ; Recorder:   FERGUSON, RN, AMBER S; Confirmation:   Confirmed ; Classification:   Patient Stated ; Code:   [id number] ; Contributor System:   PowerChart ; Last Updated:   09/03/2019 19:13 EDT ; Life Cycle Date:   09/03/2019 ; Life Cycle Status:   Active ; Vocabulary:   SNOMED CT          Procedure History        -    Procedure History   (As Of: 09/04/2019 04:01:57 EDT)     ID Risk Screen Symptoms   MRSA/VRE Screening :   Admitted to an Critical Care or BMT   Delrae Sawyers - 09/04/2019 3:43 EDT   Bloodless Medicine   Is Blood Transfusion Acceptable to Patient :   Yes   Katrinka Blazing RN, BARBARA - 09/04/2019 3:43 EDT   Nutrition   MST Does Your Current Diet Include :   None   Nutrition Screen for Malnutrition :   Patient denies   Delrae Sawyers - 09/04/2019 3:43 EDT   Functional   Sensory Deficits :   Other: wears rx glasses   ADLs Prior to Admission :   Independent   Delrae Sawyers - 09/04/2019 3:43 EDT   Social History   Social History   (As Of: 09/04/2019 04:01:57 EDT)   Tobacco:         Tobacco use: Never (less than 100 in lifetime).   (Last Updated: 09/04/2019 03:53:39 EDT by Katrinka Blazing RN, Britta Mccreedy)          Electronic Cigarette/Vaping:        Never Electronic Cigarette Use.   (Last Updated: 09/04/2019 03:53:49 EDT by Katrinka Blazing, RN, BARBARA)          Alcohol:        Denies   (Last Updated: 09/04/2019 03:53:59 EDT by Katrinka Blazing, RN, Britta Mccreedy)          Substance Use:        Denies   (Last Updated: 09/04/2019 03:54:21 EDT by Katrinka Blazing, RN, BARBARA)            Spiritual   Faith/Denomination :   None specified   Do you have a concern that you would like to address with a Chaplain? :   No   Do you have any religious/spiritual/cultural beliefs that could impact the way your care is provided? :   No   Katrinka Blazing, RN, Britta Mccreedy - 09/04/2019 3:43 EDT   Harm Screen   Suspect or Concern for: :   None   Feels Safe Where Live :   Yes   Last 3 mo, thoughts killing self/others :   Patient denies   Delrae Sawyers - 09/04/2019 3:43 EDT   Advance Directive   Advance Directive :   No  Katrinka Blazing, RN, Britta Mccreedy - 09/04/2019 3:43 EDT   Education   Written Language :   [place]   Primary Language :   [place]   Barriers to Learning :   None evident   Teaching Method :   Evelina Dun, RN, Britta Mccreedy - 09/04/2019 3:43 EDT   Preventative Measures Information   Unit/Room Orientation :   Verbalizes understanding   Environmental Safety :   [place] understanding   Hand Washing :   Verbalizes understanding   Infection Prevention :   Verbalizes understanding   DVT Prophylaxis :   Verbalizes understanding   Isolation Precaution :   Verbalizes understanding   SMITH, RN, Britta Mccreedy - 09/04/2019 3:43 EDT   DC Needs   CM Living Situation :   Family support   Anticipated Discharge Needs :   None   Katrinka Blazing, RN, Britta Mccreedy - 09/04/2019 3:43 EDT   Valuables and Belongings   Does Patient Have Valuables and Belongings :   Yes   SMITH, RN, Britta Mccreedy - 09/04/2019 3:43 EDT   Valuables and Belongings   At Bedside :   Clothes, Cell phone, Purse/Wallet, Glasses, Money, all personal  effects  and valuables listed on person and will be keeping per self//understanding responsible for all such mentioned //also have car keys with car  left at [place] as pt is  employer   [place], Corporate treasurer - 09/04/2019 3:43 EDT   Patient Search Completed :   Corinna Gab, RN, Britta Mccreedy - 09/04/2019 3:43 EDT   Admission Complete   Admission Complete :   Yes   Katrinka Blazing RN, BARBARA - 09/04/2019 3:43 EDT

## 2019-09-05 LAB — CK
Total CK: 2939 U/L — ABNORMAL HIGH (ref 20–180)
Total CK: 2513 U/L — ABNORMAL HIGH (ref 20–180)
Total CK: 1739 U/L — ABNORMAL HIGH (ref 20–180)
Total CK: 1479 U/L — ABNORMAL HIGH (ref 20–180)

## 2019-09-05 LAB — BASIC METABOLIC PANEL
Anion Gap: 15 mmol/L (ref 2–17)
BUN: 27 mg/dL — ABNORMAL HIGH (ref 6–20)
CO2: 23 mmol/L (ref 22–29)
Calcium: 8.3 mg/dL — ABNORMAL LOW (ref 8.6–10.0)
Chloride: 104 mmol/L (ref 98–107)
Creatinine: 1.3 mg/dL — ABNORMAL HIGH (ref 0.5–1.0)
GFR African American: 56 mL/min/{1.73_m2} — ABNORMAL LOW (ref >=90)
GFR Non-African American: 48 mL/min/{1.73_m2} — ABNORMAL LOW (ref >=90)
Glucose: 97 mg/dL (ref 70–99)
OSMOLALITY CALCULATED: 288 mOsm/kg — ABNORMAL HIGH (ref 270–287)
Potassium: 3.6 mmol/L (ref 3.5–5.3)
Sodium: 142 mmol/L (ref 135–145)

## 2019-09-05 LAB — CBC
Hematocrit: 31.8 % — ABNORMAL LOW (ref 34.0–47.0)
Hemoglobin: 10.1 g/dL — ABNORMAL LOW (ref 11.5–15.7)
MCH: 31.5 pg (ref 27.0–34.5)
MCHC: 31.8 g/dL — ABNORMAL LOW (ref 32.0–36.0)
MCV: 99.1 fL — ABNORMAL HIGH (ref 81.0–99.0)
MPV: 13.9 fL — ABNORMAL HIGH (ref 7.2–13.2)
NRBC Absolute: 0 10*3/uL (ref 0.000–0.012)
NRBC Automated: 0 % (ref 0.0–0.2)
Platelets: 115 10*3/uL — ABNORMAL LOW (ref 140–440)
RBC: 3.21 x10e6/mcL — ABNORMAL LOW (ref 3.60–5.20)
RDW: 14.6 % (ref 11.0–16.0)
WBC: 5.5 10*3/uL (ref 3.8–10.6)

## 2019-09-05 LAB — CULTURE, VRE, SURVEILLANCE

## 2019-09-05 LAB — POCT GLUCOSE
POC Glucose: 115 mg/dL (ref 70.0–120.0)
POC Glucose: 92 mg/dL (ref 70.0–120.0)
POC Glucose: 98 mg/dL (ref 70.0–120.0)
POC Glucose: 147 mg/dL — ABNORMAL HIGH (ref 70.0–120.0)

## 2019-09-05 LAB — CULTURE, MRSA, SURVEILLANCE

## 2019-09-05 LAB — IMMATURE CELLS
IMMATURE PLT ABSOLUTE: 14.8 10*3/uL
IMMATURE PLT PERCENT: 12.9 % — ABNORMAL HIGH (ref 1.2–8.6)

## 2019-09-05 LAB — MAGNESIUM: Magnesium: 1.8 mg/dL (ref 1.6–2.6)

## 2019-09-05 NOTE — Progress Notes (Signed)
OT Time Spent with Patient Acute/OP-Text       OT Time Spent With Patient Outpatient/Acute Entered On:  09/05/2019 13:06 EDT    Performed On:  09/05/2019 13:05 EDT by Pollie Meyer, OT, MEAGHAN E               Time Spent With Patient   OT Treatment Time Comment :   Pt declined stating she had a migraine and was in too much pain to participate.  OT will follow up when appropraite.      OT Functional Training Time :   0 minutes   OT Total Timed Code Treatment Minutes :   0 minutes   OT Total Treatment Time :   0 minutes   MCINTYRE, OT, MEAGHAN E - 09/05/2019 13:05 EDT

## 2019-09-05 NOTE — Case Communication (Signed)
CM Discharge Planning Assessment - Text       CM Progress Note Entered On:  2019/09/09 13:14 EDT    Performed On:  2019-09-09 13:13 EDT by Olevia Perches, RN, [name]               CM Progress Note   CM Home/Lay Caregiver Name/Relationship :   Boyfriend Dion Body [phone]   CM Initial Tentative Discharge Plan :   Home   CM Progress Note :   09/04/19 KD - admitted to SICU with renal failure, acidosis. CM met with patient at bedside to complete assessment. 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 Patient relays she stays at a local motel while working her 3 shifts, then checks out and drives back home to [place]. Patient states she was staying at the [facility] in [facility], but had checked out just before her final shift for the week. Patient's car is at the OSH and states she is unsure how she will get it at dc. Patient relays she is independent at baseline and when at home, lives with her boyfriend, who is bed bound. Patient will [facility] a local [facility] if prescribed any meds at dc and will f/u with her PCP. Patient has contacted her nursing recruiter to request a copy of her ins card.     09-Sep-2019 Bld cx ntd. Afebrile, 3L O2NC. Anticipate home with no needs when medically stable. Blair Promise, RN, Glee Arvin - 09-Sep-2019 13:13 EDT   Z Codes   Z code documentation :   Not applicable   Olevia Perches, RN, [name] - 09-09-2019 13:13 EDT

## 2019-09-06 LAB — POCT GLUCOSE
POC Glucose: 162 mg/dL — ABNORMAL HIGH (ref 70.0–120.0)
POC Glucose: 135 mg/dL — ABNORMAL HIGH (ref 70.0–120.0)
POC Glucose: 98 mg/dL (ref 70.0–120.0)
POC Glucose: 99 mg/dL (ref 70.0–120.0)

## 2019-09-06 LAB — BASIC METABOLIC PANEL
Anion Gap: 12 mmol/L (ref 2–17)
BUN: 19 mg/dL (ref 6–20)
CO2: 26 mmol/L (ref 22–29)
Calcium: 8.4 mg/dL — ABNORMAL LOW (ref 8.6–10.0)
Chloride: 103 mmol/L (ref 98–107)
Creatinine: 0.9 mg/dL (ref 0.5–1.0)
GFR African American: 87 mL/min/{1.73_m2} — ABNORMAL LOW (ref >=90)
GFR Non-African American: 75 mL/min/{1.73_m2} — ABNORMAL LOW (ref >=90)
Glucose: 112 mg/dL — ABNORMAL HIGH (ref 70–99)
OSMOLALITY CALCULATED: 284 mOsm/kg (ref 270–287)
Potassium: 3.7 mmol/L (ref 3.5–5.3)
Sodium: 141 mmol/L (ref 135–145)

## 2019-09-06 LAB — CK
Total CK: 1164 U/L — ABNORMAL HIGH (ref 20–180)
Total CK: 974 U/L — ABNORMAL HIGH (ref 20–180)

## 2019-09-07 LAB — BASIC METABOLIC PANEL
Anion Gap: 9 mmol/L (ref 2–17)
BUN: 16 mg/dL (ref 6–20)
CO2: 30 mmol/L — ABNORMAL HIGH (ref 22–29)
Calcium: 8.8 mg/dL (ref 8.6–10.0)
Chloride: 105 mmol/L (ref 98–107)
Creatinine: 0.8 mg/dL (ref 0.5–1.0)
GFR African American: 100 mL/min/{1.73_m2} (ref >=90)
GFR Non-African American: 87 mL/min/{1.73_m2} — ABNORMAL LOW (ref >=90)
Glucose: 103 mg/dL — ABNORMAL HIGH (ref 70–99)
OSMOLALITY CALCULATED: 288 mOsm/kg — ABNORMAL HIGH (ref 270–287)
Potassium: 4.1 mmol/L (ref 3.5–5.3)
Sodium: 144 mmol/L (ref 135–145)

## 2019-09-07 LAB — CBC
Hematocrit: 35 % (ref 34.0–47.0)
Hemoglobin: 10.8 g/dL — ABNORMAL LOW (ref 11.5–15.7)
MCH: 31.3 pg (ref 27.0–34.5)
MCHC: 30.9 g/dL — ABNORMAL LOW (ref 32.0–36.0)
MCV: 101.4 fL — ABNORMAL HIGH (ref 81.0–99.0)
MPV: 13.5 fL — ABNORMAL HIGH (ref 7.2–13.2)
NRBC Absolute: 0.02 10*3/uL — ABNORMAL HIGH (ref 0.000–0.012)
NRBC Automated: 0.3 % — ABNORMAL HIGH (ref 0.0–0.2)
Platelets: 140 10*3/uL (ref 140–440)
RBC: 3.45 x10e6/mcL — ABNORMAL LOW (ref 3.60–5.20)
RDW: 14.5 % (ref 11.0–16.0)
WBC: 6.7 10*3/uL (ref 3.8–10.6)

## 2019-09-07 LAB — POCT GLUCOSE
POC Glucose: 132 mg/dL — ABNORMAL HIGH (ref 70.0–120.0)
POC Glucose: 99 mg/dL (ref 70.0–120.0)
POC Glucose: 100 mg/dL (ref 70.0–120.0)
POC Glucose: 119 mg/dL (ref 70.0–120.0)

## 2019-09-07 LAB — IMMATURE CELLS
IMMATURE PLT ABSOLUTE: 13.2 10*3/uL
IMMATURE PLT PERCENT: 9.4 % — ABNORMAL HIGH (ref 1.2–8.6)

## 2019-09-08 LAB — POCT GLUCOSE
POC Glucose: 131 mg/dL — ABNORMAL HIGH (ref 70.0–120.0)
POC Glucose: 95 mg/dL (ref 70.0–120.0)
POC Glucose: 97 mg/dL (ref 70.0–120.0)
POC Glucose: 134 mg/dL — ABNORMAL HIGH (ref 70.0–120.0)

## 2019-09-08 LAB — BASIC METABOLIC PANEL
Anion Gap: 8 mmol/L (ref 2–17)
BUN: 21 mg/dL — ABNORMAL HIGH (ref 6–20)
CO2: 28 mmol/L (ref 22–29)
Calcium: 8.9 mg/dL (ref 8.6–10.0)
Chloride: 108 mmol/L — ABNORMAL HIGH (ref 98–107)
Creatinine: 0.9 mg/dL (ref 0.5–1.0)
GFR African American: 87 mL/min/{1.73_m2} — ABNORMAL LOW (ref >=90)
GFR Non-African American: 75 mL/min/{1.73_m2} — ABNORMAL LOW (ref >=90)
Glucose: 103 mg/dL — ABNORMAL HIGH (ref 70–99)
OSMOLALITY CALCULATED: 290 mOsm/kg — ABNORMAL HIGH (ref 270–287)
Potassium: 4.4 mmol/L (ref 3.5–5.3)
Sodium: 144 mmol/L (ref 135–145)

## 2019-09-08 NOTE — Progress Notes (Signed)
OT Time Spent with Patient Acute/OP-Text       OT Time Spent With Patient Outpatient/Acute Entered On:  09/08/2019 8:56 EDT    Performed On:  09/08/2019 8:54 EDT by Criss Alvine, OT, Neale Burly               Time Spent With Patient   OT Individual Eval Time, Low Complexity :   0 minutes   OT Treatment Time Comment :   Orders cancelled per pt and RN. Pt is I at baseline and was working as an Charity fundraiser less than a week ago. She & RN report that she is toileting herself and pt denies any therapy needs. Also unwilling to participate 2* migraine headache 8/10 pain      OT Total Untimed Code Treatment Minutes :   0 minutes   OT Total Treatment Time :   0 minutes   PRINCE, OT, Neale Burly - 09/08/2019 8:54 EDT

## 2019-09-08 NOTE — Case Communication (Signed)
CM Discharge Planning Assessment - Text       CM Progress Note Entered On:  09/08/2019 15:19 EDT    Performed On:  09/08/2019 15:19 EDT by Wynetta Emery RN, Dorothy               CM Progress Note   CM Home/Lay Caregiver Name/Relationship :   Boyfriend [name] [phone]   CM Initial Tentative Discharge Plan :   Home   CM Progress Note :   09/04/19 KD - admitted to SICU with renal failure, acidosis. CM met with patient at bedside to complete assessment. 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 Patient relays she stays at a local motel while working her 3 shifts, then checks out and drives back home to [place]. Patient states she was staying at the [facility] in [facility], but had checked out just before her final shift for the week. Patient's car is at the OSH and states she is unsure how she will get it at dc. Patient relays she is independent at baseline and when at home, lives with her boyfriend, who is bed bound. Patient [facility] if prescribed any meds at dc and will f/u with her PCP. Patient has contacted her nursing recruiter to request a copy of her ins card.     09/05/2019 Bld cx ntd. Afebrile, 3L O2NC. Anticipate home with no needs when medically stable. ajd  09/08/19 DJ : PER MD :continued admission for symptomatic control of migraines. Discharge when symptomatically improved and able to drive 3.5 hours off opioids       Judith Blonder - 09/08/2019 15:19 EDT

## 2019-09-09 LAB — CBC
Hematocrit: 34 % (ref 34.0–47.0)
Hemoglobin: 10.4 g/dL — ABNORMAL LOW (ref 11.5–15.7)
MCH: 31.1 pg (ref 27.0–34.5)
MCHC: 30.6 g/dL — ABNORMAL LOW (ref 32.0–36.0)
MCV: 101.8 fL — ABNORMAL HIGH (ref 81.0–99.0)
MPV: 12.9 fL (ref 7.2–13.2)
NRBC Absolute: 0.04 10*3/uL — ABNORMAL HIGH (ref 0.000–0.012)
NRBC Automated: 0.5 % — ABNORMAL HIGH (ref 0.0–0.2)
Platelets: 145 10*3/uL (ref 140–440)
RBC: 3.34 x10e6/mcL — ABNORMAL LOW (ref 3.60–5.20)
RDW: 14.5 % (ref 11.0–16.0)
WBC: 7.9 10*3/uL (ref 3.8–10.6)

## 2019-09-09 LAB — POCT GLUCOSE
POC Glucose: 130 mg/dL — ABNORMAL HIGH (ref 70.0–120.0)
POC Glucose: 87 mg/dL (ref 70.0–120.0)
POC Glucose: 99 mg/dL (ref 70.0–120.0)
POC Glucose: 111 mg/dL (ref 70.0–120.0)

## 2019-09-09 LAB — BASIC METABOLIC PANEL
Anion Gap: 7 mmol/L (ref 2–17)
BUN: 25 mg/dL — ABNORMAL HIGH (ref 6–20)
CO2: 27 mmol/L (ref 22–29)
Calcium: 8.8 mg/dL (ref 8.6–10.0)
Chloride: 107 mmol/L (ref 98–107)
Creatinine: 0.8 mg/dL (ref 0.5–1.0)
GFR African American: 100 mL/min/{1.73_m2} (ref >=90)
GFR Non-African American: 87 mL/min/{1.73_m2} — ABNORMAL LOW (ref >=90)
Glucose: 100 mg/dL — ABNORMAL HIGH (ref 70–99)
OSMOLALITY CALCULATED: 286 mOsm/kg (ref 270–287)
Potassium: 4.2 mmol/L (ref 3.5–5.3)
Sodium: 141 mmol/L (ref 135–145)

## 2019-09-09 LAB — CULTURE, BLOOD 1

## 2019-09-09 NOTE — Case Communication (Signed)
CM Discharge Planning Assessment - Text       CM Progress Note Entered On:  Sep 14, 2019 11:59 EDT    Performed On:  09/14/2019 11:58 EDT by Olevia Perches, RN, [name]               CM Progress Note   CM Home/Lay Caregiver Name/Relationship :   Boyfriend Dion Body [phone]   CM Initial Tentative Discharge Plan :   Home   CM Progress Note :   09/04/19 KD - admitted to SICU with renal failure, acidosis. CM met with patient at bedside to complete assessment. 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 Patient relays she stays at a local motel while working her 3 shifts, then checks out and drives back home to [place]. Patient states she was staying at the [facility] in [facility], but had checked out just before her final shift for the week. Patient's car is at the OSH and states she is unsure how she will get it at dc. Patient relays she is independent at baseline and when at home, lives with her boyfriend, who is bed bound. Patient will [facility] if prescribed any meds at dc and will f/u with her PCP. Patient has contacted her nursing recruiter to request a copy of her ins card.     09/05/2019 Bld cx ntd. Afebrile, 3L O2NC. Anticipate home with no needs when medically stable. ajd  09/08/19 DJ : PER MD :continued admission for symptomatic control of migraines. Discharge when symptomatically improved and able to drive 3.5 hours off opioids  09/14/19 Pt with continued diarrheat and migrain pain. Afebrile, room air. Blair Promise, RN, Glee Arvin - 09-14-19 11:58 EDT   Z Codes   Z code documentation :   Not applicable   Olevia Perches, RN, [name] - 09-14-19 11:58 EDT

## 2019-09-09 NOTE — Progress Notes (Signed)
PT Time Spent with Patient Acute/OP-Text       PT Time Spent With Patient Outpatient/Acute Entered On:  09/09/2019 12:49 EDT    Performed On:  09/09/2019 8:30 EDT by Margo Aye, PT, COURTNEY B               Time Spent With Patient   PT Time In :   8:30 EST   PT Treatment Time Comment :   pt reported no difficulty with mobility. Will sign off.      PT Functional Training Time :   0 minutes   PT Total Timed Code Min :   0    PT Total Treatment Time Acute/OP :   0    HALL, PT, COURTNEY B - 09/09/2019 12:49 EDT

## 2019-09-10 LAB — BASIC METABOLIC PANEL
Anion Gap: 11 mmol/L (ref 2–17)
BUN: 31 mg/dL — ABNORMAL HIGH (ref 6–20)
CO2: 23 mmol/L (ref 22–29)
Calcium: 9.1 mg/dL (ref 8.6–10.0)
Chloride: 108 mmol/L — ABNORMAL HIGH (ref 98–107)
Creatinine: 0.8 mg/dL (ref 0.5–1.0)
GFR African American: 100 mL/min/{1.73_m2} (ref >=90)
GFR Non-African American: 87 mL/min/{1.73_m2} — ABNORMAL LOW (ref >=90)
Glucose: 164 mg/dL — ABNORMAL HIGH (ref 70–99)
OSMOLALITY CALCULATED: 293 mOsm/kg — ABNORMAL HIGH (ref 270–287)
Potassium: 4.7 mmol/L (ref 3.5–5.3)
Sodium: 142 mmol/L (ref 135–145)

## 2019-09-10 LAB — MAGNESIUM: Magnesium: 2 mg/dL (ref 1.6–2.6)

## 2019-09-10 LAB — POCT GLUCOSE
POC Glucose: 128 mg/dL — ABNORMAL HIGH (ref 70.0–120.0)
POC Glucose: 152 mg/dL — ABNORMAL HIGH (ref 70.0–120.0)
POC Glucose: 147 mg/dL — ABNORMAL HIGH (ref 70.0–120.0)
POC Glucose: 148 mg/dL — ABNORMAL HIGH (ref 70.0–120.0)

## 2019-09-10 NOTE — Progress Notes (Signed)
Chaplaincy Note - Text       Chaplaincy Note Entered On:  09/10/2019 14:51 EDT    Performed On:  09/10/2019 14:51 EDT by Velna Ochs               Chaplaincy Consult   Faith/Denomination :   None specified   Religious Family Issues :   Pt trying to sleep. PC available upon request   Velna Ochs - 09/10/2019 14:51 EDT

## 2019-09-11 LAB — CBC WITH AUTO DIFFERENTIAL
Absolute Baso #: 0 10*3/uL (ref 0.0–0.2)
Absolute Eos #: 0 10*3/uL (ref 0.0–0.5)
Absolute Lymph #: 1.3 10*3/uL (ref 1.0–3.2)
Absolute Mono #: 0.8 10*3/uL (ref 0.3–1.0)
Basophils %: 0.2 % (ref 0.0–2.0)
Eosinophils %: 0 % (ref 0.0–7.0)
Hematocrit: 33.9 % — ABNORMAL LOW (ref 34.0–47.0)
Hemoglobin: 10.4 g/dL — ABNORMAL LOW (ref 11.5–15.7)
Immature Grans (Abs): 0.19 10*3/uL — ABNORMAL HIGH (ref 0.00–0.06)
Immature Granulocytes: 1.7 % — ABNORMAL HIGH (ref 0.1–0.6)
Lymphocytes: 12.1 % — ABNORMAL LOW (ref 15.0–45.0)
MCH: 31 pg (ref 27.0–34.5)
MCHC: 30.7 g/dL — ABNORMAL LOW (ref 32.0–36.0)
MCV: 100.9 fL — ABNORMAL HIGH (ref 81.0–99.0)
MPV: 13.1 fL (ref 7.2–13.2)
Monocytes: 6.8 % (ref 4.0–12.0)
NRBC Absolute: 0 10*3/uL (ref 0.000–0.012)
NRBC Automated: 0 % (ref 0.0–0.2)
Neutrophils %: 79.2 % — ABNORMAL HIGH (ref 42.0–74.0)
Neutrophils Absolute: 8.7 10*3/uL — ABNORMAL HIGH (ref 1.6–7.3)
Platelets: 164 10*3/uL (ref 140–440)
RBC: 3.36 x10e6/mcL — ABNORMAL LOW (ref 3.60–5.20)
RDW: 14.6 % (ref 11.0–16.0)
WBC: 11 10*3/uL — ABNORMAL HIGH (ref 3.8–10.6)

## 2019-09-11 LAB — POCT GLUCOSE
POC Glucose: 174 mg/dL — ABNORMAL HIGH (ref 70.0–120.0)
POC Glucose: 114 mg/dL (ref 70.0–120.0)
POC Glucose: 98 mg/dL (ref 70.0–120.0)

## 2019-09-11 LAB — BASIC METABOLIC PANEL
Anion Gap: 10 mmol/L (ref 2–17)
BUN: 41 mg/dL — ABNORMAL HIGH (ref 6–20)
CO2: 24 mmol/L (ref 22–29)
Calcium: 8.6 mg/dL (ref 8.6–10.0)
Chloride: 110 mmol/L — ABNORMAL HIGH (ref 98–107)
Creatinine: 0.9 mg/dL (ref 0.5–1.0)
GFR African American: 87 mL/min/{1.73_m2} — ABNORMAL LOW (ref >=90)
GFR Non-African American: 75 mL/min/{1.73_m2} — ABNORMAL LOW (ref >=90)
Glucose: 142 mg/dL — ABNORMAL HIGH (ref 70–99)
OSMOLALITY CALCULATED: 299 mOsm/kg — ABNORMAL HIGH (ref 270–287)
Potassium: 4.6 mmol/L (ref 3.5–5.3)
Sodium: 144 mmol/L (ref 135–145)

## 2019-09-11 LAB — MORPHOLOGY CHECK
Platelet Estimate: ADEQUATE
RBC Morphology: ABNORMAL — AB

## 2019-09-11 LAB — TSH WITH REFLEX TO FT4: TSH: 0.795 mcIU/mL (ref 0.358–3.740)

## 2019-09-11 LAB — MAGNESIUM: Magnesium: 2.1 mg/dL (ref 1.6–2.6)

## 2019-09-11 NOTE — Case Communication (Signed)
CM Discharge Planning Assessment - Text       CM Discharge Plan Entered On:  September 20, 2019 12:49 EDT    Performed On:  20-Sep-2019 12:48 EDT by Haze Rushing, RN, [place] Discharge Plan   Discharge To :   Home independently   CM Family/POA/Guardian Notified :   No   CM Home/Lay Caregiver Name/Relationship :   Alyssa Wilkins [phone]   CM Medicare Patient :   No   CM Progress Note :   09/04/19 KD - admitted to SICU with renal failure, acidosis. CM met with patient at bedside to complete assessment. 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 Patient relays she stays at a local motel while working her 3 shifts, then checks out and drives back home to [place]. Patient states she was staying at the [facility] in [facility], but had checked out just before her final shift for the week. Patient's car is at the OSH and states she is unsure how she will get it at dc. Patient relays she is independent at baseline and when at home, lives with her Alyssa, who is bed bound. Patient will [facility] local [facility] if prescribed any meds at dc and will f/u with her PCP. Patient has contacted her nursing recruiter to request a copy of her ins card.     09/05/2019 Bld cx ntd. Afebrile, 3L O2NC. Anticipate home with no needs when medically stable. ajd  09/08/19 DJ : PER MD :continued admission for symptomatic control of migraines. Discharge when symptomatically improved and able to drive 3.5 hours off opioids  09/09/2019 Pt with continued diarrheat and migrain pain. Afebrile, room air. ajd  2019-09-20 Pt to dc home today, no needs. ajd       Discharge Planning Assessment Complete :   Yes   Haze Rushing RN, [place] - September 20, 2019 12:48 EDT   Z Codes   Z code documentation :   Not applicable   Haze Rushing, RN, Alyssa Wilkins - 09/20/19 12:48 EDT

## 2019-09-11 NOTE — Nursing Note (Signed)
Nursing Discharge Summary - Text       Nursing Discharge Summary Entered On:  09/11/2019 14:16 EDT    Performed On:  09/11/2019 14:15 EDT by Christophe Louis, RN, JULIE L               DC Information   Discharge To :   Home independently   Mode of Discharge :   Wheelchair   Transportation :   Cab   Accompanied By :   Bufford Spikes, RN, JULIE L - 09/11/2019 14:15 EDT   Education   Responsible Learner(s) :   Home Caregiver Name/Relationship: Harrold Donath (boyfriend)        Performed by: Loleta Chance RN, Laurena Bering A - 09/04/2019 10:24  Home Caregiver Phone Number: [phone]        Performed by: Loleta Chance RN, JAMEE A - 09/04/2019 10:24     Home Caregiver Present for Session :   No   Barriers To Learning :   None evident   Teaching Method :   Explanation, Printed materials   [place], RN, Jola Baptist - 09/11/2019 14:15 EDT   Post-Hospital Education Adult Grid   Activity Expectations :   Verbalizes understanding   Diagnostic Results :   Verbalizes understanding   Disease Process :   Verbalizes understanding   Importance of Follow-Up Visits :   Verbalizes understanding   Plan of Care :   Verbalizes understanding   When to Call Health Care Provider :   [facility] understanding   Christophe Louis, RN, Jola Baptist - 09/11/2019 14:15 EDT   Health Maintenance Education Adult Grid   Bathing/Hygiene :   Verbalizes understanding   Diet/Nutrition :   TEFL teacher understanding   CHRISTENSEN, RN, Jola Baptist - 09/11/2019 14:15 EDT   Medication Education Adult Grid   Med Dosage, Route, Scheduling :   TEFL teacher understanding   Med Generic/Brand Name, Purpose, Action :   Verbalizes understanding   Christophe Louis, RN, Jola Baptist 09/11/2019 14:15 EDT   Safety Education Adult Grid   Safety, Fall :   Verbalizes understanding   [place], RN, Jola Baptist - 09/11/2019 14:15 EDT   Time Spent Educating Patient :   30 minutes   CHRISTENSEN, RN, JULIE L - 09/11/2019 14:15 EDT

## 2019-09-24 ENCOUNTER — Telehealth: Payer: Self-pay | Admitting: *Deleted

## 2019-09-24 NOTE — Telephone Encounter (Signed)
Shannon Meadows called and she will be out of her medication 10 days before her appointment.  She last filled her hydromprphone and oxycodone 09/11/19 (by PMP) and her appointment is not until 10/22/19 with Dr Naaman Plummer.Marland Kitchen

## 2019-10-02 NOTE — Telephone Encounter (Signed)
Patient called again stating that her medications will run out 10/12/2019.  Her next appointment is not until 10/22/2019 with Dr. Naaman Plummer.    Original message sent 09/24/2019

## 2019-10-22 ENCOUNTER — Encounter: Payer: Self-pay | Admitting: Physical Medicine & Rehabilitation

## 2019-10-22 ENCOUNTER — Encounter
Payer: Managed Care, Other (non HMO) | Attending: Physical Medicine & Rehabilitation | Admitting: Physical Medicine & Rehabilitation

## 2019-10-22 ENCOUNTER — Other Ambulatory Visit: Payer: Self-pay

## 2019-10-22 VITALS — BP 129/83 | HR 127 | Temp 97.8°F | Ht 69.0 in | Wt 348.0 lb

## 2019-10-22 DIAGNOSIS — G43109 Migraine with aura, not intractable, without status migrainosus: Secondary | ICD-10-CM | POA: Insufficient documentation

## 2019-10-22 DIAGNOSIS — M797 Fibromyalgia: Secondary | ICD-10-CM | POA: Insufficient documentation

## 2019-10-22 DIAGNOSIS — Z79891 Long term (current) use of opiate analgesic: Secondary | ICD-10-CM | POA: Insufficient documentation

## 2019-10-22 DIAGNOSIS — M170 Bilateral primary osteoarthritis of knee: Secondary | ICD-10-CM | POA: Insufficient documentation

## 2019-10-22 DIAGNOSIS — G894 Chronic pain syndrome: Secondary | ICD-10-CM | POA: Diagnosis present

## 2019-10-22 DIAGNOSIS — M47816 Spondylosis without myelopathy or radiculopathy, lumbar region: Secondary | ICD-10-CM | POA: Insufficient documentation

## 2019-10-22 DIAGNOSIS — M26609 Unspecified temporomandibular joint disorder, unspecified side: Secondary | ICD-10-CM | POA: Diagnosis present

## 2019-10-22 DIAGNOSIS — M7918 Myalgia, other site: Secondary | ICD-10-CM

## 2019-10-22 DIAGNOSIS — Z5181 Encounter for therapeutic drug level monitoring: Secondary | ICD-10-CM | POA: Diagnosis present

## 2019-10-22 DIAGNOSIS — Z79899 Other long term (current) drug therapy: Secondary | ICD-10-CM | POA: Insufficient documentation

## 2019-10-22 MED ORDER — OXYCODONE HCL 5 MG PO TABS: 5.0000 mg | ORAL_TABLET | Freq: Three times a day (TID) | ORAL | 0 refills | Status: DC | PRN

## 2019-10-22 MED ORDER — HYDROMORPHONE HCL 4 MG PO TABS: 4.0000 mg | ORAL_TABLET | ORAL | 0 refills | Status: DC | PRN
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
## 2019-10-22 NOTE — Patient Instructions (Signed)
PLEASE FEEL FREE TO CALL OUR OFFICE WITH ANY PROBLEMS OR QUESTIONS ([phone])      

## 2019-10-22 NOTE — Addendum Note (Signed)
Addended by: Geryl Rankins D on: 10/22/2019 09:34 AM   Modules accepted: Orders

## 2019-10-27 LAB — DRUG TOX MONITOR 1 W/CONF, ORAL FLD

## 2019-10-27 LAB — DRUG TOX ALC METAB W/CON, ORAL FLD: Alcohol Metabolite: NEGATIVE ng/mL (ref <25)

## 2019-10-29 ENCOUNTER — Telehealth: Payer: Self-pay | Admitting: *Deleted

## 2019-10-29 ENCOUNTER — Ambulatory Visit: Payer: Self-pay | Admitting: Physical Medicine & Rehabilitation

## 2019-10-29 NOTE — Telephone Encounter (Signed)
Oral swab was negative for all medications. Oxycodone should have been present.  Plan repeat next appt.

## 2019-12-12 ENCOUNTER — Other Ambulatory Visit: Payer: Self-pay

## 2019-12-15 ENCOUNTER — Encounter: Payer: Managed Care, Other (non HMO) | Attending: Physical Medicine & Rehabilitation | Admitting: Registered Nurse

## 2019-12-15 ENCOUNTER — Other Ambulatory Visit: Payer: Self-pay

## 2019-12-15 ENCOUNTER — Encounter: Payer: Self-pay | Admitting: Registered Nurse

## 2019-12-15 VITALS — BP 118/78 | HR 96 | Temp 98.8°F | Ht 69.0 in | Wt 351.0 lb

## 2019-12-15 DIAGNOSIS — Z79891 Long term (current) use of opiate analgesic: Secondary | ICD-10-CM | POA: Insufficient documentation

## 2019-12-15 DIAGNOSIS — G894 Chronic pain syndrome: Secondary | ICD-10-CM | POA: Insufficient documentation

## 2019-12-15 DIAGNOSIS — Z5181 Encounter for therapeutic drug level monitoring: Secondary | ICD-10-CM

## 2019-12-15 DIAGNOSIS — G43109 Migraine with aura, not intractable, without status migrainosus: Secondary | ICD-10-CM

## 2019-12-15 DIAGNOSIS — M797 Fibromyalgia: Secondary | ICD-10-CM

## 2019-12-15 DIAGNOSIS — M47816 Spondylosis without myelopathy or radiculopathy, lumbar region: Secondary | ICD-10-CM | POA: Insufficient documentation

## 2019-12-15 DIAGNOSIS — M170 Bilateral primary osteoarthritis of knee: Secondary | ICD-10-CM | POA: Insufficient documentation

## 2019-12-15 DIAGNOSIS — M7918 Myalgia, other site: Secondary | ICD-10-CM | POA: Insufficient documentation

## 2019-12-15 MED ORDER — OXYCODONE HCL 5 MG PO TABS: 5.0000 mg | ORAL_TABLET | Freq: Three times a day (TID) | ORAL | 0 refills | Status: DC | PRN

## 2019-12-15 MED ORDER — HYDROMORPHONE HCL 4 MG PO TABS: 4.0000 mg | ORAL_TABLET | ORAL | 0 refills | Status: DC | PRN

## 2019-12-15 NOTE — Progress Notes (Signed)
Subjective:    Patient ID: Shannon Meadows, female    DOB: 08/30/1970, 49 y.o.   MRN: [id number]  IDP:OEUMP Shannon Meadows is a 49 y.o. female who returns for follow up appointment for chronic pain and medication refill. She states her pain is located in her lower back and bilateral knees L>R. Also reports she awaken with a migraine this morning. Shannon Meadows reports she was experiencing increase intensity of pain and admit to taking her Oxycodone 4 times a day, she did not call the office prior to self medication. Narcotic Policy was reviewed and she verbalizes understanding. She is aware if this occurs again this can lead to her being discharged from our office, she verbalizes understanding.  She rates her pain 6. Her current exercise regime is walking. She was encouraged to increase her HEP as tolerated.   Shannon Meadows Morphine equivalent is 30.00 MME.  Oral Swab was Performed today.     Pain Inventory Average Pain 6 Pain Right Now 6 My pain is intermittent, constant, sharp, burning, dull, tingling and aching  In the last 24 hours, has pain interfered with the following? General activity 6 Relation with others 6 Enjoyment of life 6 What TIME of day is your pain at its worst? daytime Sleep (in general) Poor  Pain is worse with: walking, bending, inactivity, standing and some activites Pain improves with: rest, pacing activities, medication and injections Relief from Meds: 5  Family History  Problem Relation Age of Onset  . Hyperlipidemia Father   . Hypertension Father   . Arthritis Father   . Heart disease Father   . Asthma Mother   . Diabetes Mother   . Arthritis Mother   . Heart disease Mother   . Heart disease Other   . Lung disease Other   . Diabetes Other   . Hypertension Other    Social History   Socioeconomic History  . Marital status: Single    Spouse name: Not on file  . Number of children: Not on file  . Years of education: Not on file  . Highest education level: Not on  file  Occupational History  . Not on file  Tobacco Use  . Smoking status: Never Smoker  . Smokeless tobacco: Never Used  Vaping Use  . Vaping Use: Never used  Substance and Sexual Activity  . Alcohol use: Not Currently  . Drug use: Never  . Sexual activity: Not Currently  Other Topics Concern  . Not on file  Social History Narrative  . Not on file   Social Determinants of Health   Financial Resource Strain:   . Difficulty of Paying Living Expenses: Not on file  Food Insecurity:   . Worried About Charity fundraiser in the Last Year: Not on file  . Ran Out of Food in the Last Year: Not on file  Transportation Needs:   . Lack of Transportation (Medical): Not on file  . Lack of Transportation (Non-Medical): Not on file  Physical Activity:   . Days of Exercise per Week: Not on file  . Minutes of Exercise per Session: Not on file  Stress:   . Feeling of Stress : Not on file  Social Connections:   . Frequency of Communication with Friends and Family: Not on file  . Frequency of Social Gatherings with Friends and Family: Not on file  . Attends Religious Services: Not on file  . Active Member of Clubs or Organizations: Not on file  .  Attends Archivist Meetings: Not on file  . Marital Status: Not on file   Past Surgical History:  Procedure Laterality Date  . KNEE ARTHROSCOPY    . [place]  . ULNAR COLLATERAL LIGAMENT RECONSTRUCTION     Past Surgical History:  Procedure Laterality Date  . KNEE ARTHROSCOPY    . [place]  . ULNAR COLLATERAL LIGAMENT RECONSTRUCTION     Past Medical History:  Diagnosis Date  . Back pain   . Cervicalgia   . Chronic headaches   . Chronic pain syndrome   . Endometriosis   . Facet syndrome, lumbar   . Hyperlipidemia   . Hypertension   . Lumbar sprain   . Migraines   . Myofascial pain   . Neck pain   . TMJ syndrome    BP 118/78   Pulse 96   Temp 98.8 F (37.1 C)   Ht  5\' 9"  (1.753 m)   Wt (!) 351 lb (159.2 kg)   SpO2 97%   BMI 51.83 kg/m   Opioid Risk Score:   Fall Risk Score:  `1  Depression screen PHQ 2/9  Depression screen [facility] 2/9 08/26/2019 11/13/2018 05/08/2018 04/19/2015 04/10/2014  Decreased Interest 0 0 0 0 0  Down, Depressed, Hopeless 0 0 0 1 1  PHQ - 2 Score 0 0 0 1 1  Altered sleeping - - - - 2  Tired, decreased energy - - - - 2  Change in appetite - - - - 1  Feeling bad or failure about yourself  - - - - 0  Trouble concentrating - - - - 0  Moving slowly or fidgety/restless - - - - 0  Suicidal thoughts - - - - 0  PHQ-9 Score - - - - 6   Review of Systems     Objective:   Physical Exam Vitals and nursing note reviewed.  Constitutional:      Appearance: Normal appearance. She is obese.  Cardiovascular:     Rate and Rhythm: Normal rate and regular rhythm.     Pulses: Normal pulses.     Heart sounds: Normal heart sounds.  Pulmonary:     Effort: Pulmonary effort is normal.     Breath sounds: Normal breath sounds.  Musculoskeletal:     Cervical back: Normal range of motion and neck supple.     Comments: Normal Muscle Bulk and Muscle Testing Reveals:  Upper Extremities: Full ROM and Muscle Strength 5/5  Lower Extremities: Right: Full ROM and Muscle Strength 5/5 Left Lower Extremity: Decreased ROM and Muscle Strength 5/5  Left Lower Extremity Flexion Produces Pain into her Left Patella Arises from Table Slowly Narrow Based  Gait   Skin:    General: Skin is warm and dry.  Neurological:     Mental Status: She is alert and oriented to person, place, and time.  Psychiatric:        Mood and Affect: Mood normal.        Behavior: Behavior normal.           Assessment & Plan:  1.History of chronic migraines with aura and cervicalgia with myofascial pain.Refilled: Dilaudid 4mg  1- tablet 4 hour as needed for severe pain.#12. Was given a second script for the following month.12/15/2019 2. Lumbar facet disease with history of  disk disease as well. Continue with Exercise and use Heat therapy.12/15/2019 Refilled: Oxycodone 5 mg one tablet every 8 hours as  needed #90. Was given a second scriptfor the following month.12/15/2019. 3.Primary Bilateral Osteoarthritis of the left knee greater than right with meniscal. Chondromalacia patellae changes.Continue to monitor. Continue HEP as Tolerated. Continue to Monitor.12/15/2019. 4.Leftelbow pain: No complaints today.Ortho Following.S/Psurgery on01/04/2019.Continue to monitor.12/15/2019. 5. Chronic Left Shoulder pain:No complaints today.Continue HEP as Tolerated. Continue to Monitor.12/15/2019 6. Sacral Pain: No complaints today. Continue HEP as Tolerated. Continue to Monitor. Continue current medication regimen.12/15/2019.  F/U in 2 months

## 2019-12-16 ENCOUNTER — Telehealth: Payer: Self-pay

## 2019-12-16 NOTE — Telephone Encounter (Signed)
Call placed to pharmacy and spoke with pharmacist. They will fill Shannon Meadows Oxycodone. Placed a call to Shannon Meadows explained the above and reiterated to follow the prescription as prescribed, she verbalizes understanding. Also instructed if she develops increase intensity of pain to call office first, she verbalizes understanding and she is aware if she self medicates again this will lead her to be discharge from our office, she verbalizes understanding.

## 2019-12-16 NOTE — Telephone Encounter (Signed)
Please call the pharmacy to have Shannon Meadows Oxycodone released early. Otherwise it will not be released until 12/18/2019. Per Izora Gala the pharmacy did not get your note.   Call back ph [phone].

## 2019-12-18 LAB — DRUG TOX MONITOR 1 W/CONF, ORAL FLD
Amphetamines: NEGATIVE ng/mL (ref <10)
Barbiturates: NEGATIVE ng/mL (ref <10)
Benzodiazepines: NEGATIVE ng/mL (ref <0.50)
Buprenorphine: NEGATIVE ng/mL (ref <0.10)
Cocaine: NEGATIVE ng/mL (ref <5.0)
Codeine: NEGATIVE ng/mL (ref <2.5)
Dihydrocodeine: NEGATIVE ng/mL (ref <2.5)
Fentanyl: NEGATIVE ng/mL (ref <0.10)
Heroin Metabolite: NEGATIVE ng/mL (ref <1.0)
Hydrocodone: NEGATIVE ng/mL (ref <2.5)
Hydromorphone: NEGATIVE ng/mL (ref <2.5)
MARIJUANA: NEGATIVE ng/mL (ref <2.5)
MDMA: NEGATIVE ng/mL (ref <10)
Meprobamate: NEGATIVE ng/mL (ref <2.5)
Methadone: NEGATIVE ng/mL (ref <5.0)
Morphine: NEGATIVE ng/mL (ref <2.5)
Nicotine Metabolite: NEGATIVE ng/mL (ref <5.0)
Norhydrocodone: NEGATIVE ng/mL (ref <2.5)
Noroxycodone: 18 ng/mL — ABNORMAL HIGH (ref <2.5)
Opiates: POSITIVE ng/mL — AB (ref <2.5)
Oxycodone: 138.7 ng/mL — ABNORMAL HIGH (ref <2.5)
Oxymorphone: 3.5 ng/mL — ABNORMAL HIGH (ref <2.5)
Phencyclidine: NEGATIVE ng/mL (ref <10)
Tapentadol: NEGATIVE ng/mL (ref <5.0)
Tramadol: NEGATIVE ng/mL (ref <5.0)
Zolpidem: NEGATIVE ng/mL (ref <5.0)

## 2019-12-18 LAB — DRUG TOX ALC METAB W/CON, ORAL FLD: Alcohol Metabolite: NEGATIVE ng/mL (ref <25)

## 2019-12-29 ENCOUNTER — Telehealth: Payer: Self-pay | Admitting: *Deleted

## 2019-12-29 NOTE — Telephone Encounter (Signed)
Oral swab drug screen was consistent for prescribed medications.  ?

## 2020-02-09 ENCOUNTER — Encounter: Payer: Self-pay | Admitting: Registered Nurse

## 2020-02-10 ENCOUNTER — Encounter: Payer: Self-pay | Admitting: Registered Nurse

## 2020-02-12 ENCOUNTER — Encounter (INDEPENDENT_AMBULATORY_CARE_PROVIDER_SITE_OTHER): Payer: Self-pay

## 2020-02-13 ENCOUNTER — Encounter: Payer: Self-pay | Attending: Physical Medicine & Rehabilitation | Admitting: Registered Nurse

## 2020-02-13 ENCOUNTER — Other Ambulatory Visit: Payer: Self-pay

## 2020-02-13 ENCOUNTER — Encounter: Payer: Self-pay | Admitting: Registered Nurse

## 2020-02-13 VITALS — BP 118/78 | Ht 69.0 in | Wt 351.0 lb

## 2020-02-13 DIAGNOSIS — M7918 Myalgia, other site: Secondary | ICD-10-CM | POA: Insufficient documentation

## 2020-02-13 DIAGNOSIS — M47816 Spondylosis without myelopathy or radiculopathy, lumbar region: Secondary | ICD-10-CM | POA: Insufficient documentation

## 2020-02-13 DIAGNOSIS — M170 Bilateral primary osteoarthritis of knee: Secondary | ICD-10-CM | POA: Insufficient documentation

## 2020-02-13 DIAGNOSIS — M25522 Pain in left elbow: Secondary | ICD-10-CM | POA: Insufficient documentation

## 2020-02-13 DIAGNOSIS — Z79891 Long term (current) use of opiate analgesic: Secondary | ICD-10-CM | POA: Insufficient documentation

## 2020-02-13 DIAGNOSIS — Z5181 Encounter for therapeutic drug level monitoring: Secondary | ICD-10-CM | POA: Insufficient documentation

## 2020-02-13 DIAGNOSIS — M542 Cervicalgia: Secondary | ICD-10-CM | POA: Insufficient documentation

## 2020-02-13 DIAGNOSIS — G894 Chronic pain syndrome: Secondary | ICD-10-CM | POA: Insufficient documentation

## 2020-02-13 DIAGNOSIS — M797 Fibromyalgia: Secondary | ICD-10-CM | POA: Insufficient documentation

## 2020-02-13 DIAGNOSIS — G8929 Other chronic pain: Secondary | ICD-10-CM | POA: Insufficient documentation

## 2020-02-13 MED ORDER — OXYCODONE HCL 5 MG PO TABS: 5.0000 mg | ORAL_TABLET | Freq: Three times a day (TID) | ORAL | 0 refills | Status: DC | PRN

## 2020-02-13 MED ORDER — HYDROMORPHONE HCL 4 MG PO TABS: 4.0000 mg | ORAL_TABLET | ORAL | 0 refills | Status: DC | PRN
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
## 2020-03-02 ENCOUNTER — Telehealth: Payer: Self-pay | Admitting: *Deleted

## 2020-03-02 DIAGNOSIS — M47816 Spondylosis without myelopathy or radiculopathy, lumbar region: Secondary | ICD-10-CM

## 2020-03-02 DIAGNOSIS — M170 Bilateral primary osteoarthritis of knee: Secondary | ICD-10-CM

## 2020-03-02 MED ORDER — PREGABALIN 300 MG PO CAPS: 300.0000 mg | ORAL_CAPSULE | Freq: Two times a day (BID) | ORAL | 2 refills | Status: DC

## 2020-03-02 NOTE — Telephone Encounter (Signed)
Shannon Meadows called and says she has been out of her lyrica for a week and has been trying to get it refilled.  [facility] well for [facility].  I have [facility] as requested. Shannon Meadows notified.

## 2020-03-27 ENCOUNTER — Other Ambulatory Visit: Payer: Self-pay | Admitting: Physical Medicine & Rehabilitation

## 2020-03-27 DIAGNOSIS — Z79899 Other long term (current) drug therapy: Secondary | ICD-10-CM

## 2020-03-27 DIAGNOSIS — M1712 Unilateral primary osteoarthritis, left knee: Secondary | ICD-10-CM

## 2020-03-27 DIAGNOSIS — Z5181 Encounter for therapeutic drug level monitoring: Secondary | ICD-10-CM

## 2020-03-27 DIAGNOSIS — M797 Fibromyalgia: Secondary | ICD-10-CM

## 2020-03-27 DIAGNOSIS — G43109 Migraine with aura, not intractable, without status migrainosus: Secondary | ICD-10-CM

## 2020-03-27 DIAGNOSIS — G894 Chronic pain syndrome: Secondary | ICD-10-CM

## 2020-03-27 DIAGNOSIS — M26609 Unspecified temporomandibular joint disorder, unspecified side: Secondary | ICD-10-CM

## 2020-04-05 ENCOUNTER — Ambulatory Visit: Payer: Self-pay | Admitting: Registered Nurse

## 2020-04-07 ENCOUNTER — Encounter: Payer: 59 | Admitting: Registered Nurse

## 2020-04-07 ENCOUNTER — Telehealth: Payer: Self-pay

## 2020-04-07 DIAGNOSIS — M47816 Spondylosis without myelopathy or radiculopathy, lumbar region: Secondary | ICD-10-CM

## 2020-04-07 DIAGNOSIS — M170 Bilateral primary osteoarthritis of knee: Secondary | ICD-10-CM

## 2020-04-07 MED ORDER — PREGABALIN 300 MG PO CAPS: 300.0000 mg | ORAL_CAPSULE | Freq: Two times a day (BID) | ORAL | 3 refills | Status: DC

## 2020-04-07 NOTE — Telephone Encounter (Signed)
Patient called stating she is working in [place] and is out of Pregabalin, it will not transfer. Patient is requesting a refill of Pregabalin to be sent to [facility] in [place], [place]

## 2020-04-07 NOTE — Telephone Encounter (Signed)
Rx written and sent to the pharmacy. Thanks!  

## 2020-04-15 ENCOUNTER — Other Ambulatory Visit: Payer: Self-pay

## 2020-04-15 ENCOUNTER — Encounter: Payer: 59 | Attending: Physical Medicine & Rehabilitation | Admitting: Registered Nurse

## 2020-04-15 ENCOUNTER — Encounter: Payer: Self-pay | Admitting: Registered Nurse

## 2020-04-15 VITALS — BP 148/76 | HR 78 | Temp 97.9°F | Ht 69.0 in | Wt 335.6 lb

## 2020-04-15 DIAGNOSIS — M170 Bilateral primary osteoarthritis of knee: Secondary | ICD-10-CM | POA: Diagnosis present

## 2020-04-15 DIAGNOSIS — M542 Cervicalgia: Secondary | ICD-10-CM | POA: Diagnosis present

## 2020-04-15 DIAGNOSIS — M47816 Spondylosis without myelopathy or radiculopathy, lumbar region: Secondary | ICD-10-CM | POA: Diagnosis present

## 2020-04-15 DIAGNOSIS — G8929 Other chronic pain: Secondary | ICD-10-CM

## 2020-04-15 DIAGNOSIS — M25522 Pain in left elbow: Secondary | ICD-10-CM | POA: Diagnosis present

## 2020-04-15 DIAGNOSIS — Z79891 Long term (current) use of opiate analgesic: Secondary | ICD-10-CM | POA: Diagnosis present

## 2020-04-15 DIAGNOSIS — G894 Chronic pain syndrome: Secondary | ICD-10-CM | POA: Insufficient documentation

## 2020-04-15 DIAGNOSIS — M797 Fibromyalgia: Secondary | ICD-10-CM | POA: Insufficient documentation

## 2020-04-15 DIAGNOSIS — Z5181 Encounter for therapeutic drug level monitoring: Secondary | ICD-10-CM | POA: Diagnosis present

## 2020-04-15 DIAGNOSIS — M7918 Myalgia, other site: Secondary | ICD-10-CM | POA: Diagnosis present

## 2020-04-15 DIAGNOSIS — G43109 Migraine with aura, not intractable, without status migrainosus: Secondary | ICD-10-CM | POA: Insufficient documentation

## 2020-04-15 MED ORDER — OXYCODONE HCL 5 MG PO TABS: 5.0000 mg | ORAL_TABLET | Freq: Three times a day (TID) | ORAL | 0 refills | Status: DC | PRN

## 2020-04-15 MED ORDER — HYDROMORPHONE HCL 4 MG PO TABS
4.0000 mg | ORAL_TABLET | ORAL | 0 refills | Status: DC | PRN
Start: 2020-04-15 — End: 2020-06-11

## 2020-04-15 MED ORDER — HYDROMORPHONE HCL 4 MG PO TABS: 4.0000 mg | ORAL_TABLET | ORAL | 0 refills | Status: DC | PRN

## 2020-04-15 NOTE — Progress Notes (Signed)
Subjective:    Patient ID: Shannon Meadows, female    DOB: 08/07/1970, 50 y.o.   MRN: [id number]  HPI: Shannon Meadows is a 50 y.o. female who returns for follow up appointment for chronic pain and medication refill. She states her pain is located in her neck, left elbow, lower back and bilateral knee pain. She also reports she awaken with a headache today. She  rates her pain 6. Her current exercise regime is walking and performing stretching exercises.  Ms. Bevans Morphine equivalent is 118.50  MME.  UDS ordered today.    Pain Inventory Average Pain 6 Pain Right Now 6 My pain is intermittent, constant, sharp, burning, dull, stabbing, tingling and aching  In the last 24 hours, has pain interfered with the following? General activity 6 Relation with others 6 Enjoyment of life 6 What TIME of day is your pain at its worst? daytime Sleep (in general) Poor  Pain is worse with: some activites Pain improves with: rest, pacing activities, medication and injections Relief from Meds: 4  Family History  Problem Relation Age of Onset  . Hyperlipidemia Father   . Hypertension Father   . Arthritis Father   . Heart disease Father   . Asthma Mother   . Diabetes Mother   . Arthritis Mother   . Heart disease Mother   . Heart disease Other   . Lung disease Other   . Diabetes Other   . Hypertension Other    Social History   Socioeconomic History  . Marital status: Single    Spouse name: Not on file  . Number of children: Not on file  . Years of education: Not on file  . Highest education level: Not on file  Occupational History  . Not on file  Tobacco Use  . Smoking status: Never Smoker  . Smokeless tobacco: Never Used  Vaping Use  . Vaping Use: Never used  Substance and Sexual Activity  . Alcohol use: Not Currently  . Drug use: Never  . Sexual activity: Not Currently  Other Topics Concern  . Not on file  Social History Narrative  . Not on file   Social Determinants of Health    Financial Resource Strain: Not on file  Food Insecurity: Not on file  Transportation Needs: Not on file  Physical Activity: Not on file  Stress: Not on file  Social Connections: Not on file   Past Surgical History:  Procedure Laterality Date  . KNEE ARTHROSCOPY    . [place]  . ULNAR COLLATERAL LIGAMENT RECONSTRUCTION     Past Surgical History:  Procedure Laterality Date  . KNEE ARTHROSCOPY    . [place]  . ULNAR COLLATERAL LIGAMENT RECONSTRUCTION     Past Medical History:  Diagnosis Date  . Back pain   . Cervicalgia   . Chronic headaches   . Chronic pain syndrome   . Endometriosis   . Facet syndrome, lumbar   . Hyperlipidemia   . Hypertension   . Lumbar sprain   . Migraines   . Myofascial pain   . Neck pain   . TMJ syndrome    There were no vitals taken for this visit.  Opioid Risk Score:   Fall Risk Score:  `1  Depression screen PHQ 2/9  Depression screen [facility] 2/9 02/13/2020 12/15/2019 08/26/2019 11/13/2018 05/08/2018 04/19/2015 04/10/2014  Decreased Interest 0 0 0 0 0 0 0  Down, Depressed,  Hopeless 0 0 0 0 0 1 1  PHQ - 2 Score 0 0 0 0 0 1 1  Altered sleeping - - - - - - 2  Tired, decreased energy - - - - - - 2  Change in appetite - - - - - - 1  Feeling bad or failure about yourself  - - - - - - 0  Trouble concentrating - - - - - - 0  Moving slowly or fidgety/restless - - - - - - 0  Suicidal thoughts - - - - - - 0  PHQ-9 Score - - - - - - 6    Review of Systems  Musculoskeletal: Positive for back pain and neck pain.       Pain in both knees, left elbow  Neurological: Positive for headaches.  All other systems reviewed and are negative.      Objective:   Physical Exam Vitals and nursing note reviewed.  Constitutional:      Appearance: Normal appearance.  Neck:     Comments: Cervical Paraspinal Tenderness: C-5-C-6 Cardiovascular:     Rate and Rhythm: Normal rate and regular rhythm.     Pulses:  Normal pulses.     Heart sounds: Normal heart sounds.  Pulmonary:     Effort: Pulmonary effort is normal.     Breath sounds: Normal breath sounds.  Musculoskeletal:     Cervical back: Normal range of motion and neck supple.     Comments: Normal Muscle Bulk and Muscle Testing Reveals:  Upper Extremities: Full ROM and Muscle Strength 5/5 Bilateral AC Joint Tenderness  Thoracic Paraspinal Tenderness: T-3-T-7 Lumbar Paraspinal Tenderness: L-4-L-5 Lower Extremities: Full ROM and Muscle Strength 5/5 Arises from Table with ease Narrow Based  Gait   Skin:    General: Skin is warm and dry.  Neurological:     Mental Status: She is alert and oriented to person, place, and time.  Psychiatric:        Mood and Affect: Mood normal.        Behavior: Behavior normal.           Assessment & Plan:  1.History of chronic migraines with aura and cervicalgia with myofascial pain.Refilled: Dilaudid 4mg  1- tablet 4 hour as needed for severe pain.#12. Was given a second scriptfor the following month. Educated on [place] and sample given and she verbalizes understanding. 04/15/2020 2. Lumbar facet disease with history of disk disease as well. Continue with Exercise and use Heat therapy.04/15/2020 Refilled: Oxycodone 5 mg one tablet every 8 hours as needed #90. Was given a second scriptfor the following month.04/15/2020 3.Primary Bilateral Osteoarthritis of the left knee greater than right with meniscal. Chondromalacia patellae changes.Continue to monitor.Continue HEP as Tolerated. Continue to Monitor.04/15/2020. 4.Leftelbow pain:.Ortho Following.S/Psurgery on01/04/2019.Continue to monitor.04/15/2020. 5. Chronic Left Shoulder pain:No complaints today.Continue HEP as Tolerated. Continue to Monitor.04/15/2020 6. Sacral Pain:No complaints today.Continue HEP as Tolerated. Continue to Monitor. Continue current medication regimen.04/15/2020. 7. Cervicalgia: Continue HEP as tolerated.  Continue to monitor.   F/U in 2 months

## 2020-04-16 ENCOUNTER — Telehealth: Payer: Self-pay | Admitting: *Deleted

## 2020-04-16 NOTE — Telephone Encounter (Signed)
Prior authorization for oxycodone 5 mg APPROVED  04/16/2020 - 10/13/2020

## 2020-04-21 LAB — TOXASSURE SELECT,+ANTIDEPR,UR

## 2020-04-27 ENCOUNTER — Telehealth: Payer: Self-pay | Admitting: *Deleted

## 2020-04-27 NOTE — Telephone Encounter (Signed)
Urine drug screen for this encounter is consistent for prescribed medication. She had not had her hydromorphone in over a week.

## 2020-05-14 ENCOUNTER — Telehealth: Payer: Self-pay | Admitting: Registered Nurse

## 2020-05-14 NOTE — Telephone Encounter (Signed)
Ms. Shannon Meadows came to office today as instructed by provider for Aimovig 70 mg/ml injection sample. At this time she doesn't have a refrigerator. Educated on [place] and verbalizes understanding.

## 2020-06-11 ENCOUNTER — Encounter: Payer: Self-pay | Admitting: Registered Nurse

## 2020-06-11 ENCOUNTER — Other Ambulatory Visit: Payer: Self-pay

## 2020-06-11 ENCOUNTER — Encounter: Payer: 59 | Attending: Physical Medicine & Rehabilitation | Admitting: Registered Nurse

## 2020-06-11 VITALS — BP 111/76 | HR 87 | Temp 98.6°F | Ht 69.0 in | Wt 349.2 lb

## 2020-06-11 DIAGNOSIS — M542 Cervicalgia: Secondary | ICD-10-CM | POA: Diagnosis not present

## 2020-06-11 DIAGNOSIS — Z5181 Encounter for therapeutic drug level monitoring: Secondary | ICD-10-CM | POA: Insufficient documentation

## 2020-06-11 DIAGNOSIS — M797 Fibromyalgia: Secondary | ICD-10-CM | POA: Diagnosis present

## 2020-06-11 DIAGNOSIS — M47816 Spondylosis without myelopathy or radiculopathy, lumbar region: Secondary | ICD-10-CM | POA: Diagnosis present

## 2020-06-11 DIAGNOSIS — M7918 Myalgia, other site: Secondary | ICD-10-CM | POA: Diagnosis present

## 2020-06-11 DIAGNOSIS — G894 Chronic pain syndrome: Secondary | ICD-10-CM | POA: Diagnosis present

## 2020-06-11 DIAGNOSIS — G43109 Migraine with aura, not intractable, without status migrainosus: Secondary | ICD-10-CM | POA: Insufficient documentation

## 2020-06-11 DIAGNOSIS — Z79891 Long term (current) use of opiate analgesic: Secondary | ICD-10-CM | POA: Insufficient documentation

## 2020-06-11 DIAGNOSIS — M170 Bilateral primary osteoarthritis of knee: Secondary | ICD-10-CM | POA: Diagnosis present

## 2020-06-11 MED ORDER — PREGABALIN 300 MG PO CAPS: 300.0000 mg | ORAL_CAPSULE | Freq: Two times a day (BID) | ORAL | 3 refills | Status: DC

## 2020-06-11 MED ORDER — OXYCODONE HCL 5 MG PO TABS: 5.0000 mg | ORAL_TABLET | Freq: Three times a day (TID) | ORAL | 0 refills | Status: DC | PRN

## 2020-06-11 MED ORDER — HYDROMORPHONE HCL 4 MG PO TABS: 4.0000 mg | ORAL_TABLET | ORAL | 0 refills | Status: DC | PRN

## 2020-06-11 NOTE — Progress Notes (Signed)
Subjective:     Patient ID: Shannon Meadows, female   DOB: [birth year], 50 y.o.   MRN: [id number]  HPI: Shannon Meadows is a 50 y.o. female who returns for follow up appointment for chronic pain and medication refill. She states her pain is located in her neck, lower back pain and bilateral knee pain. Also reports she has a migraine since yesterday. She rates her pain 8. Her current exercise regime is walking and performing stretching exercises.  Ms. [place]heis Morphine equivalent is 118.50 MME.  Last UDS was Performed on 04/15/2020, it was consistent.     Pain Inventory Average Pain 6 Pain Right Now 8 My pain is intermittent, constant, sharp, burning, dull, stabbing and tingling  In the last 24 hours, has pain interfered with the following? General activity 7 Relation with others 7 Enjoyment of life 7 What TIME of day is your pain at its worst? daytime Sleep (in general) Poor  Pain is worse with: walking, bending, inactivity, unsure and some activites Pain improves with: rest, heat/ice, pacing activities, medication and injections Relief from Meds: 5  Family History  Problem Relation Age of Onset  . Hyperlipidemia Father   . Hypertension Father   . Arthritis Father   . Heart disease Father   . Asthma Mother   . Diabetes Mother   . Arthritis Mother   . Heart disease Mother   . Heart disease Other   . Lung disease Other   . Diabetes Other   . Hypertension Other    Social History   Socioeconomic History  . Marital status: Single    Spouse name: Not on file  . Number of children: Not on file  . Years of education: Not on file  . Highest education level: Not on file  Occupational History  . Not on file  Tobacco Use  . Smoking status: Never Smoker  . Smokeless tobacco: Never Used  Vaping Use  . Vaping Use: Never used  Substance and Sexual Activity  . Alcohol use: Not Currently  . Drug use: Never  . Sexual activity: Not Currently  Other Topics Concern  . Not on file   Social History Narrative  . Not on file   Social Determinants of Health   Financial Resource Strain: Not on file  Food Insecurity: Not on file  Transportation Needs: Not on file  Physical Activity: Not on file  Stress: Not on file  Social Connections: Not on file   Past Surgical History:  Procedure Laterality Date  . KNEE ARTHROSCOPY    . [place]  . ULNAR COLLATERAL LIGAMENT RECONSTRUCTION     Past Surgical History:  Procedure Laterality Date  . KNEE ARTHROSCOPY    . [place]  . ULNAR COLLATERAL LIGAMENT RECONSTRUCTION     Past Medical History:  Diagnosis Date  . Back pain   . Cervicalgia   . Chronic headaches   . Chronic pain syndrome   . Endometriosis   . Facet syndrome, lumbar   . Hyperlipidemia   . Hypertension   . Lumbar sprain   . Migraines   . Myofascial pain   . Neck pain   . TMJ syndrome    There were no vitals taken for this visit.  Opioid Risk Score:   Fall Risk Score:  `1  Depression screen PHQ 2/9  Depression screen [facility] 2/9 04/15/2020 02/13/2020 12/15/2019 08/26/2019 11/13/2018 05/08/2018 04/19/2015  Decreased Interest 0 0 0  0 0 0 0  Down, Depressed, Hopeless 0 0 0 0 0 0 1  PHQ - 2 Score 0 0 0 0 0 0 1  Altered sleeping - - - - - - -  Tired, decreased energy - - - - - - -  Change in appetite - - - - - - -  Feeling bad or failure about yourself  - - - - - - -  Trouble concentrating - - - - - - -  Moving slowly or fidgety/restless - - - - - - -  Suicidal thoughts - - - - - - -  PHQ-9 Score - - - - - - -   Review of Systems  Musculoskeletal: Positive for gait problem.  Neurological: Positive for headaches.  All other systems reviewed and are negative.      Objective:   Physical Exam Vitals and nursing note reviewed.  Constitutional:      Appearance: Normal appearance.  Neck:     Comments: Cervical Paraspinal Tenderness: C-5-C-6  Cardiovascular:     Rate and Rhythm: Normal rate and  regular rhythm.     Pulses: Normal pulses.     Heart sounds: Normal heart sounds.  Pulmonary:     Effort: Pulmonary effort is normal.     Breath sounds: Normal breath sounds.  Musculoskeletal:     Cervical back: Normal range of motion and neck supple.     Comments: Normal Muscle Bulk and Muscle Testing Reveals:  Upper Extremities: Full ROM and Muscle Strength 5/5 Bilateral AC Joint Tenderness Lumbar Paraspinal Tenderness: L-4-L-5 Lower Extremities: Full ROM and Muscle Strength 5/5 Arises from Table with ease Narrow Based Gait   Skin:    General: Skin is warm and dry.  Neurological:     Mental Status: She is alert and oriented to person, place, and time.  Psychiatric:        Mood and Affect: Mood normal.        Behavior: Behavior normal.        Assessment: Plan   1.History of chronic migraines with aura and cervicalgia with myofascial pain.Refilled: Dilaudid 4mg  1- tablet 4 hour as needed for severe pain.#12. Was given a second scriptfor the following month. Educated on [place] and sample given and she verbalizes understanding. 06/11/2020 2. Lumbar facet disease with history of disk disease as well. Continue with Exercise and use Heat therapy.06/11/2020 Refilled: Oxycodone 5 mg one tablet every 8 hours as needed #90. Was given a second scriptfor the following month.06/11/2020 3.Primary Bilateral Osteoarthritis of the left knee greater than right with meniscal. Chondromalacia patellae changes.Continue to monitor.Continue HEP as Tolerated. Continue to Monitor.06/11/2020. 4.Leftelbow pain:.Ortho Following.S/Psurgery on01/04/2019.Continue to monitor.06/11/2020. 5. Chronic Left Shoulder pain:No complaints today.Continue HEP as Tolerated. Continue to Monitor.06/11/2020 6. Sacral Pain:No complaints today.Continue HEP as Tolerated. Continue to Monitor. Continue current medication regimen.06/11/2020. 7. Cervicalgia: Continue HEP as tolerated. Continue to monitor.  06/11/2020  F/U in 2 months

## 2020-06-23 ENCOUNTER — Other Ambulatory Visit: Payer: Self-pay | Admitting: Physical Medicine & Rehabilitation

## 2020-06-23 DIAGNOSIS — G43109 Migraine with aura, not intractable, without status migrainosus: Secondary | ICD-10-CM

## 2020-07-07 ENCOUNTER — Telehealth: Payer: Self-pay | Admitting: *Deleted

## 2020-07-07 NOTE — Telephone Encounter (Signed)
Shannon Meadows called to get Aimovig samples from [place] as instructed.

## 2020-07-07 NOTE — Telephone Encounter (Signed)
Shannon Meadows came to office today as instructed by this provider for Aimovig 70 mg/ml injection sample. At this time she doesn't have a refrigerator. Educated on [place] and verbalizes understanding.

## 2020-07-08 ENCOUNTER — Telehealth: Payer: Self-pay | Admitting: Registered Nurse

## 2020-07-08 DIAGNOSIS — M47816 Spondylosis without myelopathy or radiculopathy, lumbar region: Secondary | ICD-10-CM

## 2020-07-08 DIAGNOSIS — M170 Bilateral primary osteoarthritis of knee: Secondary | ICD-10-CM

## 2020-07-08 MED ORDER — OXYCODONE HCL 5 MG PO TABS
5.0000 mg | ORAL_TABLET | Freq: Three times a day (TID) | ORAL | 0 refills | Status: DC | PRN
Start: 2020-07-08 — End: 2020-08-04

## 2020-07-08 MED ORDER — HYDROMORPHONE HCL 4 MG PO TABS: 4.0000 mg | ORAL_TABLET | ORAL | 0 refills | Status: DC | PRN

## 2020-07-08 NOTE — Telephone Encounter (Signed)
PMP was Reviewed. Prescriptions was removed from previous pharmacy on 07/07/2020.  Ms. Durall working in [place], prescriptions e- scribed today.

## 2020-07-13 ENCOUNTER — Encounter: Payer: Self-pay | Admitting: *Deleted

## 2020-07-13 ENCOUNTER — Telehealth: Payer: Self-pay | Admitting: *Deleted

## 2020-07-13 NOTE — Telephone Encounter (Signed)
Opened in error

## 2020-07-13 NOTE — Telephone Encounter (Signed)
[facility] called and says that PA must be initiated for hydromorphone before they will allow cash pay form her medication.  I have initiated the PA via CoverMyMeds and the response is that PA RESOLVED NO ADDITIONAL PA REQUIRED. I have faxed the info to the pharmacy. [place] notified.

## 2020-07-25 ENCOUNTER — Other Ambulatory Visit: Payer: Self-pay | Admitting: Physical Medicine & Rehabilitation

## 2020-07-25 DIAGNOSIS — Z79899 Other long term (current) drug therapy: Secondary | ICD-10-CM

## 2020-07-25 DIAGNOSIS — Z5181 Encounter for therapeutic drug level monitoring: Secondary | ICD-10-CM

## 2020-07-25 DIAGNOSIS — M26609 Unspecified temporomandibular joint disorder, unspecified side: Secondary | ICD-10-CM

## 2020-07-25 DIAGNOSIS — G43109 Migraine with aura, not intractable, without status migrainosus: Secondary | ICD-10-CM

## 2020-07-25 DIAGNOSIS — G894 Chronic pain syndrome: Secondary | ICD-10-CM

## 2020-07-25 DIAGNOSIS — M1712 Unilateral primary osteoarthritis, left knee: Secondary | ICD-10-CM

## 2020-07-25 DIAGNOSIS — M797 Fibromyalgia: Secondary | ICD-10-CM

## 2020-08-04 ENCOUNTER — Encounter: Payer: 59 | Attending: Physical Medicine & Rehabilitation | Admitting: Physical Medicine & Rehabilitation

## 2020-08-04 ENCOUNTER — Encounter: Payer: Self-pay | Admitting: Physical Medicine & Rehabilitation

## 2020-08-04 ENCOUNTER — Other Ambulatory Visit: Payer: Self-pay

## 2020-08-04 VITALS — BP 125/85 | HR 78 | Temp 98.3°F | Ht 69.0 in | Wt 345.0 lb

## 2020-08-04 DIAGNOSIS — G894 Chronic pain syndrome: Secondary | ICD-10-CM | POA: Diagnosis present

## 2020-08-04 DIAGNOSIS — Z79891 Long term (current) use of opiate analgesic: Secondary | ICD-10-CM | POA: Diagnosis present

## 2020-08-04 DIAGNOSIS — M7918 Myalgia, other site: Secondary | ICD-10-CM | POA: Insufficient documentation

## 2020-08-04 DIAGNOSIS — Z5181 Encounter for therapeutic drug level monitoring: Secondary | ICD-10-CM | POA: Diagnosis present

## 2020-08-04 DIAGNOSIS — M170 Bilateral primary osteoarthritis of knee: Secondary | ICD-10-CM | POA: Diagnosis present

## 2020-08-04 DIAGNOSIS — M47816 Spondylosis without myelopathy or radiculopathy, lumbar region: Secondary | ICD-10-CM

## 2020-08-04 DIAGNOSIS — G43109 Migraine with aura, not intractable, without status migrainosus: Secondary | ICD-10-CM | POA: Diagnosis present

## 2020-08-04 MED ORDER — HYDROMORPHONE HCL 4 MG PO TABS: 4.0000 mg | ORAL_TABLET | ORAL | 0 refills | Status: DC | PRN

## 2020-08-04 MED ORDER — OXYCODONE HCL 5 MG PO TABS: 5.0000 mg | ORAL_TABLET | Freq: Three times a day (TID) | ORAL | 0 refills | Status: DC | PRN

## 2020-08-04 MED ORDER — AIMOVIG 140 MG/ML ~~LOC~~ SOAJ: 140.0000 mg | SUBCUTANEOUS | 3 refills | Status: DC

## 2020-08-04 NOTE — Patient Instructions (Signed)
PLEASE FEEL FREE TO CALL OUR OFFICE WITH ANY PROBLEMS OR QUESTIONS ([phone])      
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
## 2020-08-10 LAB — TOXASSURE SELECT,+ANTIDEPR,UR

## 2020-08-13 ENCOUNTER — Telehealth: Payer: Self-pay | Admitting: *Deleted

## 2020-08-13 NOTE — Telephone Encounter (Signed)
Urine drug screen for this encounter is consistent for prescribed medication 

## 2020-08-21 ENCOUNTER — Ambulatory Visit
Admission: RE | Admit: 2020-08-21 | Discharge: 2020-08-21 | Disposition: A | Discharge status: Home / Self Care | Payer: 59 | Source: Ambulatory Visit | Attending: Physical Medicine & Rehabilitation | Admitting: Physical Medicine & Rehabilitation

## 2020-08-21 DIAGNOSIS — M47816 Spondylosis without myelopathy or radiculopathy, lumbar region: Secondary | ICD-10-CM

## 2020-08-31 ENCOUNTER — Telehealth: Payer: Self-pay | Admitting: *Deleted

## 2020-08-31 DIAGNOSIS — Z5181 Encounter for therapeutic drug level monitoring: Secondary | ICD-10-CM

## 2020-08-31 DIAGNOSIS — Z79891 Long term (current) use of opiate analgesic: Secondary | ICD-10-CM

## 2020-08-31 DIAGNOSIS — G894 Chronic pain syndrome: Secondary | ICD-10-CM

## 2020-08-31 DIAGNOSIS — M7918 Myalgia, other site: Secondary | ICD-10-CM

## 2020-08-31 DIAGNOSIS — M170 Bilateral primary osteoarthritis of knee: Secondary | ICD-10-CM

## 2020-08-31 DIAGNOSIS — M47816 Spondylosis without myelopathy or radiculopathy, lumbar region: Secondary | ICD-10-CM

## 2020-08-31 NOTE — Telephone Encounter (Signed)
Shannon Meadows called and her medication rxs say do not fill before 8/15.  Her last meds (oxycodone and hydromorphone) were filled 7/13 which would be 30 da on 8/11.  Can you send over new rxs?

## 2020-08-31 NOTE — Telephone Encounter (Signed)
Also, she had her MRI of her back done and she is asking when she can schedule an ESI ?

## 2020-09-01 ENCOUNTER — Telehealth: Payer: Self-pay

## 2020-09-01 NOTE — Telephone Encounter (Signed)
Shannon Meadows will be coming through [facility] tomorrow. She wanted to know if her pain medications can be released on the same day?   Call back phone [phone]. (Dr. Naaman Plummer is off).

## 2020-09-03 MED ORDER — HYDROMORPHONE HCL 4 MG PO TABS: 4.0000 mg | ORAL_TABLET | ORAL | 0 refills | Status: DC | PRN

## 2020-09-03 MED ORDER — OXYCODONE HCL 5 MG PO TABS: 5.0000 mg | ORAL_TABLET | Freq: Three times a day (TID) | ORAL | 0 refills | Status: DC | PRN

## 2020-09-03 NOTE — Telephone Encounter (Signed)
PMP was Reviewed: Placed a call to Shannon Meadows and spoke to the Pharmacist. They will fill Shannon Meadows medication on 09/05/2020. 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  Shannon Meadows was called regarding the above and verbalizes understanding.

## 2020-09-03 NOTE — Telephone Encounter (Signed)
Done

## 2020-09-07 ENCOUNTER — Telehealth: Payer: Self-pay | Admitting: *Deleted

## 2020-09-07 NOTE — Telephone Encounter (Signed)
I had asked Shannon Meadows to leave some times to call her where she would be available. She did not. I called today and no answer again.

## 2020-09-07 NOTE — Telephone Encounter (Signed)
[place] returned your call this morning.

## 2020-09-07 NOTE — Telephone Encounter (Signed)
I spoke with Shannon Meadows about her MRI. I would like to make a referral to Dr. Letta Pate for an L4-5 translaminar ESI as available. Pt is in agreement. thx

## 2020-09-30 ENCOUNTER — Encounter: Payer: 59 | Attending: Physical Medicine & Rehabilitation | Admitting: Registered Nurse

## 2020-09-30 ENCOUNTER — Other Ambulatory Visit: Payer: Self-pay

## 2020-09-30 ENCOUNTER — Encounter: Payer: Self-pay | Admitting: Registered Nurse

## 2020-09-30 VITALS — BP 117/76 | HR 89 | Temp 98.8°F | Ht 69.0 in | Wt 346.0 lb

## 2020-09-30 DIAGNOSIS — M170 Bilateral primary osteoarthritis of knee: Secondary | ICD-10-CM | POA: Diagnosis present

## 2020-09-30 DIAGNOSIS — M7918 Myalgia, other site: Secondary | ICD-10-CM

## 2020-09-30 DIAGNOSIS — Z79891 Long term (current) use of opiate analgesic: Secondary | ICD-10-CM | POA: Diagnosis present

## 2020-09-30 DIAGNOSIS — M47816 Spondylosis without myelopathy or radiculopathy, lumbar region: Secondary | ICD-10-CM | POA: Diagnosis present

## 2020-09-30 DIAGNOSIS — G43109 Migraine with aura, not intractable, without status migrainosus: Secondary | ICD-10-CM

## 2020-09-30 DIAGNOSIS — G894 Chronic pain syndrome: Secondary | ICD-10-CM | POA: Diagnosis not present

## 2020-09-30 DIAGNOSIS — Z5181 Encounter for therapeutic drug level monitoring: Secondary | ICD-10-CM | POA: Diagnosis not present

## 2020-09-30 DIAGNOSIS — M797 Fibromyalgia: Secondary | ICD-10-CM | POA: Diagnosis present

## 2020-09-30 MED ORDER — HYDROMORPHONE HCL 4 MG PO TABS: 4.0000 mg | ORAL_TABLET | ORAL | 0 refills | Status: DC | PRN

## 2020-09-30 MED ORDER — OXYCODONE HCL 5 MG PO TABS: 5.0000 mg | ORAL_TABLET | Freq: Three times a day (TID) | ORAL | 0 refills | Status: DC | PRN

## 2020-09-30 NOTE — Progress Notes (Signed)
Subjective:    Patient ID: Shannon Meadows, female    DOB: 09/06/1970, 50 y.o.   MRN: [id number]  HPI: Shannon Meadows is a 50 y.o. female who returns for follow up appointment for chronic pain and medication refill. She states her  pain is located in lower back also reports she has a migraine today. She  rates her pain 7. Her current exercise regime is walking and performing stretching exercises.  [name] Morphine equivalent is 118.50  MME.   Last UDS was Performed on 08/04/2020, it was consistent.     Pain Inventory Average Pain 8 Pain Right Now 7 My pain is intermittent, constant, sharp, burning, stabbing, tingling, and aching  In the last 24 hours, has pain interfered with the following? General activity 7 Relation with others 7 Enjoyment of life 7 What TIME of day is your pain at its worst? daytime Sleep (in general) Poor  Pain is worse with: inactivity, unsure, and some activites Pain improves with: rest, heat/ice, pacing activities, medication, TENS, and injections Relief from Meds:   Family History  Problem Relation Age of Onset   Hyperlipidemia Father    Hypertension Father    Arthritis Father    Heart disease Father    Asthma Mother    Diabetes Mother    Arthritis Mother    Heart disease Mother    Heart disease Other    Lung disease Other    Diabetes Other    Hypertension Other    Social History   Socioeconomic History   Marital status: Single    Spouse name: Not on file   Number of children: Not on file   Years of education: Not on file   Highest education level: Not on file  Occupational History   Not on file  Tobacco Use   Smoking status: Never   Smokeless tobacco: Never  Vaping Use   Vaping Use: Never used  Substance and Sexual Activity   Alcohol use: Not Currently   Drug use: Never   Sexual activity: Not Currently  Other Topics Concern   Not on file  Social History Narrative   Not on file   Social Determinants of Health   Financial  Resource Strain: Not on file  Food Insecurity: Not on file  Transportation Needs: Not on file  Physical Activity: Not on file  Stress: Not on file  Social Connections: Not on file   Past Surgical History:  Procedure Laterality Date   KNEE ARTHROSCOPY     LAPOROSCOPIC SURGERY     UTEREINE   ULNAR COLLATERAL LIGAMENT RECONSTRUCTION     Past Surgical History:  Procedure Laterality Date   KNEE ARTHROSCOPY     LAPOROSCOPIC SURGERY     UTEREINE   ULNAR COLLATERAL LIGAMENT RECONSTRUCTION     Past Medical History:  Diagnosis Date   Back pain    Cervicalgia    Chronic headaches    Chronic pain syndrome    Endometriosis    Facet syndrome, lumbar    Hyperlipidemia    Hypertension    Lumbar sprain    Migraines    Myofascial pain    Neck pain    TMJ syndrome    Temp 98.8 F (37.1 C)   Ht '5\' 9"'$  (1.753 m)   Wt (!) 346 lb (156.9 kg)   BMI 51.10 kg/m   Opioid Risk Score:   Fall Risk Score:  `1  Depression screen PHQ 2/9  Depression screen [facility] 2/9 08/04/2020  06/11/2020 04/15/2020 02/13/2020 12/15/2019 08/26/2019 11/13/2018  Decreased Interest 0 0 0 0 0 0 0  Down, Depressed, Hopeless 0 0 0 0 0 0 0  PHQ - 2 Score 0 0 0 0 0 0 0  Altered sleeping - - - - - - -  Tired, decreased energy - - - - - - -  Change in appetite - - - - - - -  Feeling bad or failure about yourself  - - - - - - -  Trouble concentrating - - - - - - -  Moving slowly or fidgety/restless - - - - - - -  Suicidal thoughts - - - - - - -  PHQ-9 Score - - - - - - -    Review of Systems  Constitutional: Negative.   HENT: Negative.    Eyes: Negative.   Respiratory: Negative.    Cardiovascular: Negative.   Gastrointestinal: Negative.   Endocrine: Negative.   Musculoskeletal:  Positive for neck pain.  Skin: Negative.   Allergic/Immunologic: Negative.   Neurological:  Positive for headaches.       Tingling   Hematological: Negative.   Psychiatric/Behavioral: Negative.    All other systems reviewed and are  negative.     Objective:   Physical Exam Vitals and nursing note reviewed.  Constitutional:      Appearance: Normal appearance. She is obese.  Cardiovascular:     Rate and Rhythm: Normal rate and regular rhythm.     Pulses: Normal pulses.     Heart sounds: Normal heart sounds.  Pulmonary:     Effort: Pulmonary effort is normal.     Breath sounds: Normal breath sounds.  Musculoskeletal:     Cervical back: Normal range of motion and neck supple.     Comments: Normal Muscle Bulk and Muscle Testing Reveals:  Upper Extremities: Full ROM and Muscle Strength 5/5 Bilateral Shoulders Lumbar Hypersensitivity Lower Extremities: Full ROM and Muscle Strength 5/5 Arises from Table Slowly Antalgic Gait     Skin:    General: Skin is warm and dry.  Neurological:     Mental Status: She is alert and oriented to person, place, and time.  Psychiatric:        Mood and Affect: Mood normal.        Behavior: Behavior normal.         Assessment & Plan:  1.  History of chronic migraines with aura and cervicalgia with myofascial pain.Refilled: Dilaudid '4mg'$  1- tablet 4 hour as needed for severe pain. #12. Was given a second script for the following month.  Educated on [place] and  two samples given, she verbalizes understanding. 09/30/2020 2. 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 Scheduled for Lumbar ESI with Dr Letta Pate. Continue with Exercise and use Heat therapy. 09/30/2020 Refilled: Oxycodone 5 mg one tablet every 8 hours as needed #90. Was given a second script for the following month. 09/30/2020 3.  Primary Bilateral Osteoarthritis of the left knee greater than right with meniscal. Chondromalacia patellae changes.Continue to monitor. Continue HEP as Tolerated. Continue to Monitor. 09/30/2020. 4. Left elbow pain:.Ortho Following. S/P surgery on  01/27/2019. Continue to monitor. 09/30/2020. 5. Chronic Left Shoulder pain: No complaints today. Continue HEP as Tolerated.  Continue to Monitor.09/30/2020 6. Sacral Pain: No complaints today. Continue HEP as Tolerated. Continue to Monitor. Continue current medication regimen. 09/30/2020.  7. Cervicalgia: No complaints today. Continue HEP as tolerated. Continue to monitor. 09/30/2020  F/U in 2 months

## 2020-10-04 ENCOUNTER — Ambulatory Visit: Payer: 59 | Admitting: Registered Nurse

## 2020-10-17 ENCOUNTER — Other Ambulatory Visit: Payer: Self-pay | Admitting: Registered Nurse

## 2020-10-17 DIAGNOSIS — M47816 Spondylosis without myelopathy or radiculopathy, lumbar region: Secondary | ICD-10-CM

## 2020-10-17 DIAGNOSIS — M170 Bilateral primary osteoarthritis of knee: Secondary | ICD-10-CM

## 2020-10-25 ENCOUNTER — Other Ambulatory Visit: Payer: Self-pay

## 2020-10-25 DIAGNOSIS — Z5181 Encounter for therapeutic drug level monitoring: Secondary | ICD-10-CM

## 2020-10-25 DIAGNOSIS — M170 Bilateral primary osteoarthritis of knee: Secondary | ICD-10-CM

## 2020-10-25 DIAGNOSIS — Z79891 Long term (current) use of opiate analgesic: Secondary | ICD-10-CM

## 2020-10-25 DIAGNOSIS — G894 Chronic pain syndrome: Secondary | ICD-10-CM

## 2020-10-25 DIAGNOSIS — M47816 Spondylosis without myelopathy or radiculopathy, lumbar region: Secondary | ICD-10-CM

## 2020-10-25 DIAGNOSIS — M7918 Myalgia, other site: Secondary | ICD-10-CM

## 2020-10-25 MED ORDER — OXYCODONE HCL 5 MG PO TABS: 5.0000 mg | ORAL_TABLET | Freq: Three times a day (TID) | ORAL | 0 refills | Status: DC | PRN

## 2020-10-25 NOTE — Telephone Encounter (Signed)
Shannon Meadows is working in [place]. She will be out of the Oxycodone on Oct 8th. If possible, [facility] [place] [facility] # [phone]).    Call back phone [phone].

## 2020-10-25 NOTE — Telephone Encounter (Signed)
All set - thx

## 2020-10-27 ENCOUNTER — Telehealth: Payer: Self-pay

## 2020-10-27 ENCOUNTER — Other Ambulatory Visit: Payer: Self-pay

## 2020-10-27 DIAGNOSIS — G894 Chronic pain syndrome: Secondary | ICD-10-CM

## 2020-10-27 DIAGNOSIS — M7918 Myalgia, other site: Secondary | ICD-10-CM

## 2020-10-27 DIAGNOSIS — M47816 Spondylosis without myelopathy or radiculopathy, lumbar region: Secondary | ICD-10-CM

## 2020-10-27 DIAGNOSIS — M170 Bilateral primary osteoarthritis of knee: Secondary | ICD-10-CM

## 2020-10-27 DIAGNOSIS — Z79891 Long term (current) use of opiate analgesic: Secondary | ICD-10-CM

## 2020-10-27 DIAGNOSIS — Z5181 Encounter for therapeutic drug level monitoring: Secondary | ICD-10-CM

## 2020-10-27 MED ORDER — HYDROMORPHONE HCL 4 MG PO TABS: 4.0000 mg | ORAL_TABLET | ORAL | 0 refills | Status: DC | PRN

## 2020-10-27 NOTE — Telephone Encounter (Signed)
Rx Dilaudid to [facility] has been cancelled. Patient choice. Refill to be sent to [place] by Dr. Naaman Plummer.

## 2020-10-27 NOTE — Telephone Encounter (Signed)
Rx sent 

## 2020-10-29 NOTE — Telephone Encounter (Signed)
Task completed

## 2020-11-11 ENCOUNTER — Encounter: Payer: Self-pay | Admitting: Physical Medicine & Rehabilitation

## 2020-11-11 ENCOUNTER — Other Ambulatory Visit: Payer: Self-pay

## 2020-11-11 ENCOUNTER — Telehealth: Payer: Self-pay | Admitting: Registered Nurse

## 2020-11-11 ENCOUNTER — Encounter: Payer: 59 | Attending: Physical Medicine & Rehabilitation | Admitting: Physical Medicine & Rehabilitation

## 2020-11-11 VITALS — Ht 69.0 in | Wt 357.0 lb

## 2020-11-11 DIAGNOSIS — M5416 Radiculopathy, lumbar region: Secondary | ICD-10-CM | POA: Diagnosis present

## 2020-11-11 NOTE — Progress Notes (Signed)
Left L5-S1 paramedian Interlaminar epidural steroid injection under fluoroscopic guidance  Indication: Lumbosacral radiculitis is not relieved by medication management or other conservative care and interfering with self-care and mobility.  No  anticoagulant use.  Informed consent was obtained after describing risk and benefits of the procedure with the patient, this includes bleeding, bruising, infection, paralysis and medication side effects.  The patient wishes to proceed and has given written consent.  Patient was placed in a prone position.  The lumbar area was marked and prepped with Betadine.  It was entered with a 25-gauge 1-1/2 inch needle and one mL of 1% lidocaine was injected into the skin and subcutaneous tissue.  Then a 18-gauge 6" tuohy spinal needle was inserted under fluoroscopic guidance into the Left L5-S1 interlaminar space under AP and Lateral imaging.  Once needle tip of approximated the posterior elements, a loss of resistance technique was utilized with lateral imaging. Cross table oblique demonstrated needle tip just beyond interlaminar line.  A positive loss of resistance was obtained and then confirmed by injecting 2 mL's of Omnipaque 180 showing good epidural spread .  Then a solution containing 1.5 mL's of 6mg /ml Celestone and 1.5 mL's of 1% lidocaine was injected.  The patient tolerated procedure well.  Post procedure instructions were given.  Please see post procedure form.  Post procedure patient experienced left greater than right foot numbness which resolved after observation for 30 minutes.  No headache

## 2020-11-11 NOTE — Progress Notes (Signed)
[facility]   Name: ROLAND PRINE DOB:1970/09/04 MRN: [id number]  Date:11/11/2020  Physician: Alysia Penna, MD    Nurse/CMA: Truman Hayward, CMA  Allergies:  Allergies  Allergen Reactions   Chlorpromazine Anaphylaxis and Anxiety   Latex     Other reaction(s): Shortness Of Breath Rash, SOB - Shortness of breath, Dyspnea   Thorazine [Chlorpromazine Hcl] Anaphylaxis   Clonidine Derivatives Other (See Comments)    INTOLERANCE   Effexor [Venlafaxine Hydrochloride] Hypertension   Penicillins Rash   Venlafaxine Rash    Other reaction(s): Other (See Comments)    Consent Signed: Yes.    Is patient diabetic? Yes.    CBG today? 120  Pregnant: No. LMP: No LMP recorded. (Menstrual status: IUD). (age [age])  Anticoagulants: no Anti-inflammatory: yes (etolac) Antibiotics: no  Procedure: Lumbar L4-5 Translaminar Epidual Steroid Injection  Position: Prone Start Time: 10:52 am  End Time: 10:58 am  Fluoro Time: 66  RN/CMA Truman Hayward, CMA Lee, CMA    Time 10:15 am 11:13am    BP 157/89 136/87    Pulse 85 83    Respirations 16 16    O2 Sat 93 95    S/S 6 6    Pain Level 6/10 5/10     D/C home with cousin, patient A & O X 3, D/C instructions reviewed, and sits independently.       50 year old female with history of chronic low back pain referred for epidural steroid injection.  MRI lumbar spine performed on 08/21/2020 reviewed.  Images reviewed as well.  Report as below, pain has not been relieved with medication management including high-dose narcotic analgesics.  MRI LUMBAR SPINE WITHOUT CONTRAST   TECHNIQUE: Multiplanar, multisequence MR imaging of the lumbar spine was performed. No intravenous contrast was administered.   COMPARISON:  Radiographs of the lumbar spine 08/27/2017. Lumbar spine MRI 09/19/2010.   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 For the purposes of this dictation, the lowest  well-formed intervertebral disc space is designated L5-S1. A and assimilation joint is present on the left at L5-S1.   Alignment: Straightening of the expected lumbar lordosis. Lumbar levocurvature. No significant spondylolisthesis.   Vertebrae: Vertebral body height is maintained. Multilevel degenerative endplate irregularity, greatest at T11-T12 and L4-L5. Trace degenerative endplate edema at X3-A3. Mild marrow edema along the left L5-S1 assimilation joint. Multilevel vertebral body hemangiomas. Multilevel ventrolateral osteophytes.   Conus medullaris and cauda equina: Conus extends to the L1-L2 level. No signal abnormality within the visualized distal spinal cord. 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   Paraspinal and other soft tissues: No abnormality identified within included portions of the abdomen/retroperitoneum. Paraspinal soft tissues unremarkable.   Disc levels:   Unless otherwise stated, the level by level findings below have not significantly changed from the prior MRI of 09/19/2010.   Progressive multilevel disc degeneration. Most notably, there is progressive moderate disc degeneration at L4-L5.   T11-T12: Small disc bulge. Minimal partial effacement of ventral thecal sac without spinal cord mass effect. No significant foraminal stenosis.   T12-L1: Small disc bulge. Mild facet arthrosis/ligamentum flavum hypertrophy. Minimal partial effacement of the ventral thecal sac without spinal cord mass effect. No significant foraminal stenosis.   L1-L2: Mild facet arthrosis/ligamentum flavum hypertrophy. No significant disc herniation or stenosis.   L2-L3: Disc bulge, new from the prior exam. Mild facet arthrosis/ligamentum flavum hypertrophy, progressed. No significant spinal canal or foraminal stenosis.   L3-L4: Small disc  bulge. Mild facet arthrosis/ligamentum flavum hypertrophy. No significant spinal canal or  foraminal stenosis.   L4-L5: Disc bulge with endplate spurring, progressed. New superimposed broad-based disc protrusion spanning the central and bilateral subarticular zones. Moderate facet arthrosis with ligamentum flavum hypertrophy, progressed. Progressive bilateral subarticular stenosis with encroachment upon the bilateral descending L5 nerve roots (series 7, image 31). Progressive moderate central canal stenosis. Progressive mild/moderate bilateral neural foraminal narrowing   L5-S1: Mild facet arthrosis. No significant disc herniation or stenosis.   IMPRESSION: Transitional lumbosacral anatomy with spinal numbering, as described.   Lumbar spondylosis, as outlined and having progressed at several levels since the prior lumbar spine MRI of 09/19/2010. Findings are most notably as follows.   At L4-L5, there is progressive moderate disc degeneration. Progressive multifactorial bilateral subarticular stenosis with encroachment upon the bilateral descending L5 nerve roots. Progressive moderate central canal stenosis. Progressive mild/moderate bilateral neural foraminal narrowing.   Lumbar levocurvature.     Electronically Signed   By: Kellie Simmering DO   On: 08/22/2020 13:09

## 2020-11-11 NOTE — Telephone Encounter (Signed)
Shannon Meadows was in office today, seeing Dr Letta Pate for an injection.  Shannon Meadows was educated on Aimovig and Two samples of Aimovig given , she verbalizes understanding.

## 2020-11-22 DIAGNOSIS — Z5181 Encounter for therapeutic drug level monitoring: Secondary | ICD-10-CM

## 2020-11-22 DIAGNOSIS — G894 Chronic pain syndrome: Secondary | ICD-10-CM

## 2020-11-22 DIAGNOSIS — M7918 Myalgia, other site: Secondary | ICD-10-CM

## 2020-11-22 DIAGNOSIS — M47816 Spondylosis without myelopathy or radiculopathy, lumbar region: Secondary | ICD-10-CM

## 2020-11-22 DIAGNOSIS — M170 Bilateral primary osteoarthritis of knee: Secondary | ICD-10-CM

## 2020-11-22 DIAGNOSIS — Z79891 Long term (current) use of opiate analgesic: Secondary | ICD-10-CM

## 2020-11-22 MED ORDER — OXYCODONE HCL 5 MG PO TABS: 5.0000 mg | ORAL_TABLET | Freq: Three times a day (TID) | ORAL | 0 refills | Status: DC | PRN

## 2020-11-22 MED ORDER — HYDROMORPHONE HCL 4 MG PO TABS
4.0000 mg | ORAL_TABLET | ORAL | 0 refills | Status: DC | PRN
Start: 2020-11-22 — End: 2020-12-31

## 2020-11-22 MED ORDER — HYDROMORPHONE HCL 4 MG PO TABS: 4.0000 mg | ORAL_TABLET | ORAL | 0 refills | Status: DC | PRN

## 2020-11-22 NOTE — Telephone Encounter (Signed)
Both meds refilled

## 2020-11-27 ENCOUNTER — Other Ambulatory Visit: Payer: Self-pay | Admitting: Physical Medicine & Rehabilitation

## 2020-11-27 DIAGNOSIS — G43109 Migraine with aura, not intractable, without status migrainosus: Secondary | ICD-10-CM

## 2020-11-27 DIAGNOSIS — M797 Fibromyalgia: Secondary | ICD-10-CM

## 2020-11-27 DIAGNOSIS — M1712 Unilateral primary osteoarthritis, left knee: Secondary | ICD-10-CM

## 2020-11-27 DIAGNOSIS — G894 Chronic pain syndrome: Secondary | ICD-10-CM

## 2020-11-27 DIAGNOSIS — Z5181 Encounter for therapeutic drug level monitoring: Secondary | ICD-10-CM

## 2020-11-27 DIAGNOSIS — Z79899 Other long term (current) drug therapy: Secondary | ICD-10-CM

## 2020-11-27 DIAGNOSIS — M26609 Unspecified temporomandibular joint disorder, unspecified side: Secondary | ICD-10-CM

## 2020-11-29 NOTE — Telephone Encounter (Signed)
Refill for Tizanidine sent from the pharmacy. Would you like to refill?

## 2020-12-01 ENCOUNTER — Telehealth: Payer: Self-pay

## 2020-12-01 NOTE — Telephone Encounter (Signed)
Patient called stating that she spoke to [place] about 6 months to a year ago about prescribing Etodolac for her arthritis when her Orthopedic stop prescribing. Now Orthopedic stop prescribing and she will be out before next appt. [facility]

## 2020-12-02 MED ORDER — ETODOLAC 400 MG PO TABS: 400.0000 mg | ORAL_TABLET | Freq: Two times a day (BID) | ORAL | 1 refills | Status: DC | PRN

## 2020-12-02 NOTE — Telephone Encounter (Signed)
Etodolac, e-scribed today. Ms. Beitler is aware of the above via My-Chart.

## 2020-12-17 ENCOUNTER — Other Ambulatory Visit: Payer: Self-pay | Admitting: Physical Medicine & Rehabilitation

## 2020-12-17 DIAGNOSIS — G43109 Migraine with aura, not intractable, without status migrainosus: Secondary | ICD-10-CM

## 2020-12-31 ENCOUNTER — Encounter: Payer: 59 | Attending: Physical Medicine & Rehabilitation | Admitting: Registered Nurse

## 2020-12-31 ENCOUNTER — Encounter: Payer: Self-pay | Admitting: Registered Nurse

## 2020-12-31 ENCOUNTER — Other Ambulatory Visit: Payer: Self-pay | Admitting: Registered Nurse

## 2020-12-31 ENCOUNTER — Ambulatory Visit (HOSPITAL_COMMUNITY)
Admission: EM | Admit: 2020-12-31 | Discharge: 2020-12-31 | Disposition: A | Discharge status: Home / Self Care | Payer: 59 | Attending: Family Medicine | Admitting: Family Medicine

## 2020-12-31 ENCOUNTER — Encounter (HOSPITAL_COMMUNITY): Payer: Self-pay

## 2020-12-31 ENCOUNTER — Other Ambulatory Visit: Payer: Self-pay

## 2020-12-31 ENCOUNTER — Ambulatory Visit (HOSPITAL_COMMUNITY): Payer: 59

## 2020-12-31 VITALS — BP 144/93 | HR 66 | Temp 97.9°F | Ht 69.0 in | Wt 362.0 lb

## 2020-12-31 DIAGNOSIS — L03116 Cellulitis of left lower limb: Secondary | ICD-10-CM | POA: Diagnosis not present

## 2020-12-31 DIAGNOSIS — M170 Bilateral primary osteoarthritis of knee: Secondary | ICD-10-CM

## 2020-12-31 DIAGNOSIS — M47816 Spondylosis without myelopathy or radiculopathy, lumbar region: Secondary | ICD-10-CM

## 2020-12-31 DIAGNOSIS — M79605 Pain in left leg: Secondary | ICD-10-CM

## 2020-12-31 DIAGNOSIS — M7918 Myalgia, other site: Secondary | ICD-10-CM | POA: Diagnosis present

## 2020-12-31 DIAGNOSIS — M7989 Other specified soft tissue disorders: Secondary | ICD-10-CM

## 2020-12-31 DIAGNOSIS — Z79891 Long term (current) use of opiate analgesic: Secondary | ICD-10-CM

## 2020-12-31 DIAGNOSIS — Z5181 Encounter for therapeutic drug level monitoring: Secondary | ICD-10-CM | POA: Diagnosis present

## 2020-12-31 DIAGNOSIS — G43109 Migraine with aura, not intractable, without status migrainosus: Secondary | ICD-10-CM

## 2020-12-31 DIAGNOSIS — G894 Chronic pain syndrome: Secondary | ICD-10-CM

## 2020-12-31 MED ORDER — PREGABALIN 300 MG PO CAPS: 300.0000 mg | ORAL_CAPSULE | Freq: Two times a day (BID) | ORAL | 3 refills | Status: DC

## 2020-12-31 MED ORDER — OXYCODONE HCL 5 MG PO TABS: 5.0000 mg | ORAL_TABLET | Freq: Three times a day (TID) | ORAL | 0 refills | Status: DC | PRN

## 2020-12-31 MED ORDER — AIMOVIG 140 MG/ML ~~LOC~~ SOAJ: 140.0000 mg | SUBCUTANEOUS | 3 refills | Status: DC

## 2020-12-31 MED ORDER — CLINDAMYCIN HCL 300 MG PO CAPS: 300.0000 mg | ORAL_CAPSULE | Freq: Three times a day (TID) | ORAL | 0 refills | Status: AC

## 2020-12-31 MED ORDER — HYDROMORPHONE HCL 4 MG PO TABS
4.0000 mg | ORAL_TABLET | ORAL | 0 refills | Status: DC | PRN
Start: 2020-12-31 — End: 2021-02-25

## 2020-12-31 NOTE — ED Provider Notes (Addendum)
[place]    CSN: [id number] Arrival date & time: 12/31/20  1035      History   Chief Complaint Chief Complaint  Patient presents with   Leg Swelling    HPI Shannon Meadows is a 50 y.o. female.   HPI Here for pain and swelling of her left lower leg.  She fell getting out of the shower on Thanksgiving, 11/24, and had noted some bruising/contusion on the left lateral lower leg then. 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 No f/c noted. No n/v.  At her pain clinic appt today, erythema was noted so she came her to be seen and treated for cellulitis. Does note concern about DVT  She lives in [place].  No h/o DM noted  Past Medical History:  Diagnosis Date   Back pain    Cervicalgia    Chronic headaches    Chronic pain syndrome    Endometriosis    Facet syndrome, lumbar    Hyperlipidemia    Hypertension    Lumbar sprain    Migraines    Myofascial pain    Neck pain    TMJ syndrome     Patient Active Problem List   Diagnosis Date Noted   Closed dislocation of left elbow 11/13/2018   Myofascial muscle pain 04/04/2016   Migraine with visual aura 03/04/2012   TMJ (temporomandibular joint disorder) 04/03/2011   Fibromyalgia 04/03/2011   Primary osteoarthritis of both knees 04/03/2011   Spondylosis of lumbar region without myelopathy or radiculopathy 04/03/2011    Past Surgical History:  Procedure Laterality Date   KNEE ARTHROSCOPY     LAPOROSCOPIC SURGERY     UTEREINE   ULNAR COLLATERAL LIGAMENT RECONSTRUCTION      OB History     Gravida  1   Para  0   Term  0   Preterm  0   AB  0   Living  0      SAB  0   IAB  0   Ectopic  0   Multiple  0   Live Births  0            Home Medications    Prior to Admission medications   Medication Sig Start Date End Date Taking? Authorizing Provider  clindamycin  (CLEOCIN) 300 MG capsule Take 1 capsule (300 mg total) by mouth 3 (three) times daily for 7 days. 12/31/20 01/07/21 Yes Barrett Henle, MD  albuterol (PROVENTIL) (2.5 MG/3ML) 0.083% nebulizer solution Take 2.5 mg by nebulization every 4 (four) hours as needed for wheezing.    [provider]  atorvastatin (LIPITOR) 20 MG tablet Take 40 mg by mouth daily.     [provider]  diclofenac sodium (VOLTAREN) 1 % GEL Apply 1 application topically 2 (two) times daily.  10/29/18   [provider]  Erenumab-aooe (AIMOVIG) 140 MG/ML SOAJ Inject 140 mg into the skin every 30 (thirty) days. 12/31/20   Bayard Hugger, NP  etodolac (LODINE) 400 MG tablet Take 1 tablet (400 mg total) by mouth 2 (two) times daily as needed. 12/02/20   Bayard Hugger, NP  hydrochlorothiazide (HYDRODIURIL) 25 MG tablet Take 25 mg by mouth daily as needed. 03/23/20   [provider]  HYDROmorphone (DILAUDID) 4 MG tablet Take 1 tablet (4 mg  total) by mouth every 4 (four) hours as needed for severe pain. 12/31/20   Bayard Hugger, NP  oxyCODONE (OXY IR/ROXICODONE) 5 MG immediate release tablet Take 1 tablet (5 mg total) by mouth every 8 (eight) hours as needed. May take an extra tablet when pain is severe 10 days out of the month 12/31/20   Bayard Hugger, NP  pregabalin (LYRICA) 300 MG capsule Take 1 capsule (300 mg total) by mouth 2 (two) times daily. 12/31/20   Bayard Hugger, NP  PROAIR HFA 108 [phone] Base) MCG/ACT inhaler USE 2 PUFFS EVERY 4 HOURS AS NEEDED 06/09/15   [provider]  Semaglutide (OZEMPIC, 1 MG/DOSE, [place]) Inject into the skin.    [provider]  tiZANidine (ZANAFLEX) 2 MG tablet TAKE 1 TABLET BY MOUTH EVERY 8 HOURS 12/01/20   Meredith Staggers, MD  topiramate (TOPAMAX) 50 MG tablet TAKE 1 TABLET BY MOUTH EVERY MORNING THEN 2 TABLETS EVERY EVENING 12/20/20   Meredith Staggers, MD  TRUE METRIX BLOOD GLUCOSE TEST test strip SMARTSIG:Via Meter 12/15/19   [provider]  valsartan (DIOVAN) 160 MG tablet Take 160 mg by mouth daily. 10/13/19   [provider]  valsartan-hydrochlorothiazide (DIOVAN-HCT) 320-12.5 MG tablet  02/04/16   [provider]    Family History Family History  Problem Relation Age of Onset   Hyperlipidemia Father    Hypertension Father    Arthritis Father    Heart disease Father    Asthma Mother    Diabetes Mother    Arthritis Mother    Heart disease Mother    Heart disease Other    Lung disease Other    Diabetes Other    Hypertension Other     Social History Social History   Tobacco Use   Smoking status: Never   Smokeless tobacco: Never  Vaping Use   Vaping Use: Never used  Substance Use Topics   Alcohol use: Not Currently   Drug use: Never     Allergies   Chlorpromazine, Latex, Thorazine [chlorpromazine hcl], Clonidine derivatives, Effexor [venlafaxine hydrochloride], Penicillins, and Venlafaxine   Review of Systems Review of Systems   Physical Exam Triage Vital Signs ED Triage Vitals [12/31/20 1250]  Enc Vitals Group     BP (!) 215/92     Pulse Rate 94     Resp 20     Temp 98.6 F (37 C)     Temp Source Oral     SpO2 99 %     Weight      Height      Head Circumference      Peak Flow      Pain Score 7     Pain Loc      Pain Edu?      Excl. in [place]?    No data found.  Updated Vital Signs BP 134/71 (BP Location: Left Arm)   Pulse 94   Temp 98.6 F (37 C) (Oral)   Resp 20   SpO2 99%   Visual Acuity Right Eye Distance:   Left Eye Distance:   Bilateral Distance:    Right Eye Near:   Left Eye Near:    Bilateral Near:     Physical Exam Constitutional:      General: She is not in acute distress.    Appearance: She is not toxic-appearing or diaphoretic.  Cardiovascular:     Rate and Rhythm: Normal rate and regular rhythm.     Heart  sounds: No murmur heard. Pulmonary:     Effort: Pulmonary effort is normal.     Breath sounds: Normal breath sounds.   Musculoskeletal:     Left lower leg: Edema (left leg swollen/indurated. Calf tender, but entire lower leg tender. Has erythema and ecchymosis on lateral aspect of left lower leg, about 15 x 8 cm. No fluctuance) present.  Neurological:     Mental Status: She is alert and oriented to person, place, and time.  Psychiatric:        Behavior: Behavior normal.     UC Treatments / Results  Labs (all labs ordered are listed, but only abnormal results are displayed) Labs Reviewed - No data to display  EKG   Radiology No results found.  Procedures Procedures (including critical care time)  Medications Ordered in UC Medications - No data to display  Initial Impression / Assessment and Plan / UC Course  I have reviewed the triage vital signs and the nursing notes.  Pertinent labs & imaging results that were available during my care of the patient were reviewed by me and considered in my medical decision making (see chart for details).     Most likely cellulitis, but difficult to tell with the amount of swelling she has. Venous doppler ordered to be done here in [place], as we are unable to schedule in [place].  The doppler was set up for 430, and that was too late for her to stay in [place], so she decided for [place] to cancel the doppler, and she will see her pcp on 12/12. She knows to go to the ER there if she becomes worse, has cp or worsening shortness of breath  Doppler was cancelled.  Final Clinical Impressions(s) / UC Diagnoses   Final diagnoses:  Left leg pain  Cellulitis of leg, left  Left leg swelling     Discharge Instructions      Please go to your test at [facility] at 430 this afternoon at entrance C.  That will test for the blood clot.  We will let you know the results and if it is positive for a blood clot, we will send you to the emergency room for further treatment.  Take the clindamycin 3 times daily for 1 week rest your leg and return if you are feeling  worse     ED Prescriptions     Medication Sig Dispense Auth. Provider   clindamycin (CLEOCIN) 300 MG capsule Take 1 capsule (300 mg total) by mouth 3 (three) times daily for 7 days. 21 capsule Barrett Henle, MD      PDMP not reviewed this encounter.   Barrett Henle, MD 12/31/20 1317    Barrett Henle, MD 12/31/20 1339    Barrett Henle, MD 12/31/20 1339

## 2020-12-31 NOTE — Progress Notes (Signed)
Subjective:    Patient ID: Shannon Meadows, female    DOB: 1970/04/03, 50 y.o.   MRN: [id number]  HPI: Shannon Meadows is a 50 y.o. female who returns for follow up appointment for chronic pain and medication refill. She states her  pain is located in her lower back and bilateral knees. She also reports she has a headache this morning. She rates her pain 6. Her current exercise regime is walking and performing stretching exercises. Shannon Meadows reports she was getting out of the shower on 12/16/2020 when she lost her footing and her left leg hit against the shower boarder. She didn't seek medical attention, today her left lower extremity is reddened, with edema and warm to touch. Possibility she has  cellulitis, Shannon Meadows was instructed to see her PCP or Urgent Care. She states she will go to Urgent Care today.   Shannon Meadows Morphine equivalent is 73.00  MME.   UDS ordered.    Pain Inventory Average Pain 6 Pain Right Now 6 My pain is intermittent, constant, sharp, burning, dull, stabbing, tingling, and aching  In the last 24 hours, has pain interfered with the following? General activity 6 Relation with others 6 Enjoyment of life 6 What TIME of day is your pain at its worst? morning  and daytime Sleep (in general) Poor  Pain is worse with: walking, bending, sitting, inactivity, and some activites Pain improves with: rest, heat/ice, therapy/exercise, pacing activities, medication, TENS, and injections Relief from Meds: 5  Family History  Problem Relation Age of Onset   Hyperlipidemia Father    Hypertension Father    Arthritis Father    Heart disease Father    Asthma Mother    Diabetes Mother    Arthritis Mother    Heart disease Mother    Heart disease Other    Lung disease Other    Diabetes Other    Hypertension Other    Social History   Socioeconomic History   Marital status: Single    Spouse name: Not on file   Number of children: Not on file   Years of education: Not on file    Highest education level: Not on file  Occupational History   Not on file  Tobacco Use   Smoking status: Never   Smokeless tobacco: Never  Vaping Use   Vaping Use: Never used  Substance and Sexual Activity   Alcohol use: Not Currently   Drug use: Never   Sexual activity: Not Currently  Other Topics Concern   Not on file  Social History Narrative   Not on file   Social Determinants of Health   Financial Resource Strain: Not on file  Food Insecurity: Not on file  Transportation Needs: Not on file  Physical Activity: Not on file  Stress: Not on file  Social Connections: Not on file   Past Surgical History:  Procedure Laterality Date   KNEE ARTHROSCOPY     LAPOROSCOPIC SURGERY     UTEREINE   ULNAR COLLATERAL LIGAMENT RECONSTRUCTION     Past Surgical History:  Procedure Laterality Date   KNEE ARTHROSCOPY     LAPOROSCOPIC SURGERY     UTEREINE   ULNAR COLLATERAL LIGAMENT RECONSTRUCTION     Past Medical History:  Diagnosis Date   Back pain    Cervicalgia    Chronic headaches    Chronic pain syndrome    Endometriosis    Facet syndrome, lumbar    Hyperlipidemia    Hypertension  Lumbar sprain    Migraines    Myofascial pain    Neck pain    TMJ syndrome    BP (!) 144/93   Pulse 66   Temp 97.9 F (36.6 C)   Ht 5\' 9"  (1.753 m)   Wt (!) 362 lb (164.2 kg)   SpO2 97%   BMI 53.46 kg/m   Opioid Risk Score:   Fall Risk Score:  `1  Depression screen PHQ 2/9  Depression screen [facility] 2/9 11/11/2020 08/04/2020 06/11/2020 04/15/2020 02/13/2020 12/15/2019 08/26/2019  Decreased Interest 0 0 0 0 0 0 0  Down, Depressed, Hopeless 0 0 0 0 0 0 0  PHQ - 2 Score 0 0 0 0 0 0 0  Altered sleeping - - - - - - -  Tired, decreased energy - - - - - - -  Change in appetite - - - - - - -  Feeling bad or failure about yourself  - - - - - - -  Trouble concentrating - - - - - - -  Moving slowly or fidgety/restless - - - - - - -  Suicidal thoughts - - - - - - -  PHQ-9 Score - - - - - - -        Review of Systems  Constitutional: Negative.   HENT: Negative.    Eyes: Negative.   Respiratory: Negative.    Cardiovascular: Negative.   Gastrointestinal: Negative.   Endocrine: Negative.   Genitourinary: Negative.   Musculoskeletal:  Positive for back pain, gait problem and neck pain.       Left leg pain  Allergic/Immunologic: Negative.   Hematological: Negative.   Psychiatric/Behavioral: Negative.        Objective:   Physical Exam Vitals and nursing note reviewed.  Constitutional:      Appearance: Normal appearance. She is obese.  Cardiovascular:     Rate and Rhythm: Normal rate and regular rhythm.     Pulses: Normal pulses.     Heart sounds: Normal heart sounds.  Pulmonary:     Effort: Pulmonary effort is normal.     Breath sounds: Normal breath sounds.  Musculoskeletal:        General: Swelling present.     Cervical back: Normal range of motion and neck supple.     Left lower leg: Edema present.     Comments: Normal Muscle Bulk and Muscle Testing Reveals:  Upper Extremities: Full ROM and Muscle Strength 5/5 Lower Extremities: Full ROM and Muscle Strength 5/5 Arises from Table Slowly Antalgic  Gait     Skin:    General: Skin is warm and dry.     Findings: Erythema present.     Comments: Left Lower Extremity: Reddened, with Warmth  Neurological:     Mental Status: She is alert and oriented to person, place, and time.  Psychiatric:        Mood and Affect: Mood normal.        Behavior: Behavior normal.         Assessment & Plan:  1.  History of chronic migraines with aura and cervicalgia with myofascial pain.Refilled: Dilaudid 4mg  1- tablet 4 hour as needed for severe pain. #12.    Educated on [place] and  sample given, she verbalizes understanding. 12/31/2020 2. 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 S/P Lumbar ESI with Dr Letta Pate on 11/11/2020, with good relief noted. Continue with Exercise and use Heat therapy.  12/31/2020 Refilled: Oxycodone 5  mg one tablet every 8 hours as needed #90.We will continue the opioid monitoring program, this consists of regular clinic visits, examinations, urine drug screen, pill counts as well as use of [place] Controlled Substance Reporting system. A 12 month History has been reviewed on the [place] Today.   12/31/2020 3.  Primary Bilateral Osteoarthritis of the left knee greater than right with meniscal. Chondromalacia patellae changes.Continue to monitor. Continue HEP as Tolerated. Continue to Monitor. 12/31/2020. 4. Left elbow pain:.No complaints today. Ortho Following. S/P surgery on  01/27/2019. Continue to monitor. 12/31/2020. 5. Chronic Left Shoulder pain: No complaints today. Continue HEP as Tolerated. Continue to Monitor.12/31/2020 6. Sacral Pain: No complaints today. Continue HEP as Tolerated. Continue to Monitor. Continue current medication regimen. 12/31/2020. 7. Cervicalgia: No complaints today. Continue HEP as tolerated. Continue to monitor. 12/31/2020 Fall at Home: Educated on [employer], she verbalizes understanding.  8. Cellulitis: Ms. Hamor went to Urgent Care for Evaluation.   F/U in 2 months

## 2020-12-31 NOTE — Discharge Instructions (Addendum)
Please go to your test at [facility] at 430 this afternoon at entrance C.  That will test for the blood clot.  We will let you know the results and if it is positive for a blood clot, we will send you to the emergency room for further treatment.  Take the clindamycin 3 times daily for 1 week rest your leg and return if you are feeling worse

## 2020-12-31 NOTE — ED Triage Notes (Signed)
Pt states fell on thanksgiving and bruised her LLE. States today having pain swelling, redness, and tender to touch.

## 2021-01-07 LAB — TOXASSURE SELECT,+ANTIDEPR,UR

## 2021-01-14 ENCOUNTER — Other Ambulatory Visit: Payer: Self-pay | Admitting: Registered Nurse

## 2021-01-18 ENCOUNTER — Telehealth: Payer: Self-pay | Admitting: *Deleted

## 2021-01-18 NOTE — Telephone Encounter (Signed)
Urine drug screen for this encounter is consistent for prescribed medication 

## 2021-02-25 ENCOUNTER — Encounter: Payer: Self-pay | Attending: Physical Medicine & Rehabilitation | Admitting: Registered Nurse

## 2021-02-25 ENCOUNTER — Encounter: Payer: Self-pay | Admitting: Registered Nurse

## 2021-02-25 ENCOUNTER — Other Ambulatory Visit: Payer: Self-pay

## 2021-02-25 VITALS — BP 105/76 | HR 99 | Temp 98.2°F | Ht 69.0 in | Wt 363.0 lb

## 2021-02-25 DIAGNOSIS — M7918 Myalgia, other site: Secondary | ICD-10-CM | POA: Insufficient documentation

## 2021-02-25 DIAGNOSIS — G43109 Migraine with aura, not intractable, without status migrainosus: Secondary | ICD-10-CM | POA: Insufficient documentation

## 2021-02-25 DIAGNOSIS — Z79891 Long term (current) use of opiate analgesic: Secondary | ICD-10-CM | POA: Insufficient documentation

## 2021-02-25 DIAGNOSIS — M170 Bilateral primary osteoarthritis of knee: Secondary | ICD-10-CM | POA: Insufficient documentation

## 2021-02-25 DIAGNOSIS — G894 Chronic pain syndrome: Secondary | ICD-10-CM | POA: Insufficient documentation

## 2021-02-25 DIAGNOSIS — Z5181 Encounter for therapeutic drug level monitoring: Secondary | ICD-10-CM | POA: Insufficient documentation

## 2021-02-25 DIAGNOSIS — M47816 Spondylosis without myelopathy or radiculopathy, lumbar region: Secondary | ICD-10-CM | POA: Insufficient documentation

## 2021-02-25 MED ORDER — HYDROMORPHONE HCL 4 MG PO TABS: 4.0000 mg | ORAL_TABLET | ORAL | 0 refills | Status: DC | PRN

## 2021-02-25 MED ORDER — OXYCODONE HCL 5 MG PO TABS: 5.0000 mg | ORAL_TABLET | Freq: Three times a day (TID) | ORAL | 0 refills | Status: DC | PRN

## 2021-02-25 MED ORDER — AJOVY 225 MG/1.5ML ~~LOC~~ SOAJ: 1.5000 mL | SUBCUTANEOUS | 1 refills | Status: DC

## 2021-02-25 NOTE — Progress Notes (Signed)
Subjective:    Patient ID: Shannon Meadows, female    DOB: 03-Jun-1970, 51 y.o.   MRN: [id number]  HPI: Shannon Meadows is a 51 y.o. female who returns for follow up appointment for chronic pain and medication refill. She states her  pain is located in her neck, and bilateral knees L>R and she reports she has a migraine today, mainly on her left side. She  rates her pain 8. Her current exercise regime is walking and performing stretching exercises.  Shannon Meadows equivalent is 118.50 MME.   Last UDS was Performed on 12/31/2020, it was consistent.  Pain Inventory Average Pain 8 Pain Right Now 8 My pain is intermittent, constant, sharp, dull, stabbing, tingling, and aching  In the last 24 hours, has pain interfered with the following? General activity 8 Relation with others 6 Enjoyment of life 7 What TIME of day is your pain at its worst? daytime Sleep (in general) Poor  Pain is worse with: walking, bending, inactivity, and some activites Pain improves with: rest, heat/ice, medication, and injections Relief from Meds: 5  Family History  Problem Relation Age of Onset   Hyperlipidemia Father    Hypertension Father    Arthritis Father    Heart disease Father    Asthma Mother    Diabetes Mother    Arthritis Mother    Heart disease Mother    Heart disease Other    Lung disease Other    Diabetes Other    Hypertension Other    Social History   Socioeconomic History   Marital status: Significant Other    Spouse name: Not on file   Number of children: Not on file   Years of education: Not on file   Highest education level: Not on file  Occupational History   Not on file  Tobacco Use   Smoking status: Never   Smokeless tobacco: Never  Vaping Use   Vaping Use: Never used  Substance and Sexual Activity   Alcohol use: Not Currently   Drug use: Never   Sexual activity: Not Currently  Other Topics Concern   Not on file  Social History Narrative   Not on file   Social  Determinants of Health   Financial Resource Strain: Not on file  Food Insecurity: Not on file  Transportation Needs: Not on file  Physical Activity: Not on file  Stress: Not on file  Social Connections: Not on file   Past Surgical History:  Procedure Laterality Date   KNEE ARTHROSCOPY     LAPOROSCOPIC SURGERY     UTEREINE   ULNAR COLLATERAL LIGAMENT RECONSTRUCTION     Past Surgical History:  Procedure Laterality Date   KNEE ARTHROSCOPY     LAPOROSCOPIC SURGERY     UTEREINE   ULNAR COLLATERAL LIGAMENT RECONSTRUCTION     Past Medical History:  Diagnosis Date   Back pain    Cervicalgia    Chronic headaches    Chronic pain syndrome    Endometriosis    Facet syndrome, lumbar    Hyperlipidemia    Hypertension    Lumbar sprain    Migraines    Myofascial pain    Neck pain    TMJ syndrome    BP 105/76    Pulse 99    Temp 98.2 F (36.8 C)    Ht 5\' 9"  (1.753 m)    Wt (!) 363 lb (164.7 kg)    SpO2 95%    BMI 53.61 kg/m  Opioid Risk Score:   Fall Risk Score:  `1  Depression screen PHQ 2/9  Depression screen [facility] 2/9 02/25/2021 12/31/2020 11/11/2020 08/04/2020 06/11/2020 04/15/2020 02/13/2020  Decreased Interest 0 0 0 0 0 0 0  Down, Depressed, Hopeless 0 0 0 0 0 0 0  PHQ - 2 Score 0 0 0 0 0 0 0  Altered sleeping - - - - - - -  Tired, decreased energy - - - - - - -  Change in appetite - - - - - - -  Feeling bad or failure about yourself  - - - - - - -  Trouble concentrating - - - - - - -  Moving slowly or fidgety/restless - - - - - - -  Suicidal thoughts - - - - - - -  PHQ-9 Score - - - - - - -  Some recent data might be hidden     Review of Systems  Constitutional: Negative.   HENT: Negative.    Eyes: Negative.   Respiratory: Negative.    Cardiovascular: Negative.   Gastrointestinal: Negative.   Endocrine: Negative.   Genitourinary: Negative.   Musculoskeletal:  Positive for neck pain and neck stiffness.  Skin: Negative.   Allergic/Immunologic: Negative.    Neurological: Negative.   Hematological: Negative.   Psychiatric/Behavioral:  Positive for sleep disturbance.       Objective:   Physical Exam Vitals and nursing note reviewed.  Constitutional:      Appearance: Normal appearance.  Cardiovascular:     Rate and Rhythm: Normal rate and regular rhythm.     Pulses: Normal pulses.     Heart sounds: Normal heart sounds.  Pulmonary:     Effort: Pulmonary effort is normal.     Breath sounds: Normal breath sounds.  Musculoskeletal:     Cervical back: Normal range of motion and neck supple.     Comments: Normal Muscle Bulk and Muscle Testing Reveals:  Upper Extremities: Full ROM and Muscle Strength 5/5  Lower Extremities: Right: Full ROM and Muscle Strength 5/5 Left Lower Extremity: Decreased ROM and Muscle Strength 5/5 Left Lower Extremity Flexion Produces Pain into her Left Patella Arises from Table Slowly  Antalgic Gait     Skin:    General: Skin is warm and dry.  Neurological:     Mental Status: She is alert and oriented to person, place, and time.  Psychiatric:        Mood and Affect: Mood normal.        Behavior: Behavior normal.         Assessment & Plan:  1.  History of chronic migraines with aura and cervicalgia with myofascial pain.Refilled: Dilaudid 4mg  1- tablet 4 hour as needed for severe pain. #12.    Educated on [place] and  sample given, she verbalizes understanding. 02/25/2021 2. 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 S/P Lumbar ESI with Dr Letta Pate on 11/11/2020, with good relief noted. Continue with Exercise and use Heat therapy. 02/25/2021 Refilled: Oxycodone 5 mg one tablet every 8 hours as needed #90.We will continue the opioid monitoring program, this consists of regular clinic visits, examinations, urine drug screen, pill counts as well as use of [place] Controlled Substance Reporting system. A 12 month History has been reviewed on the [place] Today.   02/25/2021 3.  Primary Bilateral Osteoarthritis of the left knee greater than right with meniscal. Chondromalacia patellae changes.Continue to monitor. Continue HEP  as Tolerated. Continue to Monitor. 02/25/2021. 4. Left elbow pain:.No complaints today. Ortho Following. S/P surgery on  01/27/2019. Continue to monitor. 02/25/2021. 5. Chronic Left Shoulder pain: No complaints today. Continue HEP as Tolerated. Continue to Monitor.02/25/2021 6. Sacral Pain: No complaints today. Continue HEP as Tolerated. Continue to Monitor. Continue current medication regimen. 02/25/2021. 7. Cervicalgia:  Continue HEP as tolerated. Continue to monitor. 02/25/2021  F/U in 2 months

## 2021-03-14 ENCOUNTER — Other Ambulatory Visit: Payer: Self-pay | Admitting: Registered Nurse

## 2021-03-15 ENCOUNTER — Telehealth: Payer: Self-pay | Admitting: Registered Nurse

## 2021-03-15 DIAGNOSIS — M47816 Spondylosis without myelopathy or radiculopathy, lumbar region: Secondary | ICD-10-CM

## 2021-03-15 DIAGNOSIS — M170 Bilateral primary osteoarthritis of knee: Secondary | ICD-10-CM

## 2021-03-15 DIAGNOSIS — M7918 Myalgia, other site: Secondary | ICD-10-CM

## 2021-03-15 DIAGNOSIS — G894 Chronic pain syndrome: Secondary | ICD-10-CM

## 2021-03-15 DIAGNOSIS — Z79891 Long term (current) use of opiate analgesic: Secondary | ICD-10-CM

## 2021-03-15 DIAGNOSIS — Z5181 Encounter for therapeutic drug level monitoring: Secondary | ICD-10-CM

## 2021-03-15 MED ORDER — OXYCODONE HCL 5 MG PO TABS
5.0000 mg | ORAL_TABLET | Freq: Three times a day (TID) | ORAL | 0 refills | Status: DC | PRN
Start: 2021-03-15 — End: 2021-04-19

## 2021-03-15 MED ORDER — HYDROMORPHONE HCL 4 MG PO TABS: 4.0000 mg | ORAL_TABLET | ORAL | 0 refills | Status: DC | PRN

## 2021-03-15 NOTE — Telephone Encounter (Signed)
PMP was Reviewed.  Hydromorphone and Oxycodone e-scribed today.  Shannon Meadows is aware of the above via My-Chart message.

## 2021-03-16 ENCOUNTER — Other Ambulatory Visit: Payer: Self-pay | Admitting: Physical Medicine & Rehabilitation

## 2021-03-16 DIAGNOSIS — G43109 Migraine with aura, not intractable, without status migrainosus: Secondary | ICD-10-CM

## 2021-03-16 DIAGNOSIS — Z79899 Other long term (current) drug therapy: Secondary | ICD-10-CM

## 2021-03-16 DIAGNOSIS — G894 Chronic pain syndrome: Secondary | ICD-10-CM

## 2021-03-16 DIAGNOSIS — M797 Fibromyalgia: Secondary | ICD-10-CM

## 2021-03-16 DIAGNOSIS — Z5181 Encounter for therapeutic drug level monitoring: Secondary | ICD-10-CM

## 2021-03-16 DIAGNOSIS — M26609 Unspecified temporomandibular joint disorder, unspecified side: Secondary | ICD-10-CM

## 2021-03-16 DIAGNOSIS — M1712 Unilateral primary osteoarthritis, left knee: Secondary | ICD-10-CM

## 2021-04-13 ENCOUNTER — Other Ambulatory Visit: Payer: Self-pay | Admitting: Physical Medicine & Rehabilitation

## 2021-04-13 DIAGNOSIS — G43109 Migraine with aura, not intractable, without status migrainosus: Secondary | ICD-10-CM

## 2021-04-19 ENCOUNTER — Encounter: Payer: Self-pay | Attending: Physical Medicine & Rehabilitation | Admitting: Registered Nurse

## 2021-04-19 ENCOUNTER — Encounter: Payer: Self-pay | Admitting: Registered Nurse

## 2021-04-19 ENCOUNTER — Other Ambulatory Visit: Payer: Self-pay

## 2021-04-19 DIAGNOSIS — M7918 Myalgia, other site: Secondary | ICD-10-CM | POA: Insufficient documentation

## 2021-04-19 DIAGNOSIS — M47816 Spondylosis without myelopathy or radiculopathy, lumbar region: Secondary | ICD-10-CM | POA: Insufficient documentation

## 2021-04-19 DIAGNOSIS — M170 Bilateral primary osteoarthritis of knee: Secondary | ICD-10-CM | POA: Insufficient documentation

## 2021-04-19 DIAGNOSIS — Z79891 Long term (current) use of opiate analgesic: Secondary | ICD-10-CM | POA: Insufficient documentation

## 2021-04-19 DIAGNOSIS — Z5181 Encounter for therapeutic drug level monitoring: Secondary | ICD-10-CM | POA: Insufficient documentation

## 2021-04-19 DIAGNOSIS — G894 Chronic pain syndrome: Secondary | ICD-10-CM | POA: Insufficient documentation

## 2021-04-19 MED ORDER — HYDROMORPHONE HCL 4 MG PO TABS: 4.0000 mg | ORAL_TABLET | ORAL | 0 refills | Status: DC | PRN

## 2021-04-19 MED ORDER — PREGABALIN 300 MG PO CAPS: 300.0000 mg | ORAL_CAPSULE | Freq: Two times a day (BID) | ORAL | 3 refills | Status: DC

## 2021-04-19 MED ORDER — OXYCODONE HCL 5 MG PO TABS: 5.0000 mg | ORAL_TABLET | Freq: Three times a day (TID) | ORAL | 0 refills | Status: DC | PRN

## 2021-04-19 NOTE — Progress Notes (Signed)
? ?Subjective:  ? ? Patient ID: Shannon Meadows, female    DOB: 1970/11/02, 51 y.o.   MRN: [id number] ? ?HPI: Shannon Meadows is a 51 y.o. female whose appointment was changed to a My-Chart Video Visit , she called the office this morning reporting she awaken with a migraine. Her appointment was changed to My-Chart Video Visit, we have  discussed the limitations of evaluation and management by telemedicine and the availability of in person appointments. Shannon Meadows expressed understanding and agreed to proceed with My-Chart Video Visit..  ? She states her pain is located in her neck, lower back and bilateral knees L>R. She rates her pain 9. Her current exercise regime is walking. ? ?Shannon Meadows Morphine equivalent is 118.50   MME.   Last UDS was Performed on 12/31/2020, it was consistent.  ?  ?Pain Inventory ?Average Pain 6 ?Pain Right Now 9 ?My pain is intermittent, constant, sharp, burning, dull, stabbing, tingling, and aching ? ?In the last 24 hours, has pain interfered with the following? ?General activity 9 ?Relation with others 9 ?Enjoyment of life 10 ?What TIME of day is your pain at its worst? daytime ?Sleep (in general) Poor ? ?Pain is worse with: some activites ?Pain improves with: medication, TENS, injections, and therapy ?Relief from Meds: 4 ? ?Family History  ?Problem Relation Age of Onset  ? Hyperlipidemia Father   ? Hypertension Father   ? Arthritis Father   ? Heart disease Father   ? Asthma Mother   ? Diabetes Mother   ? Arthritis Mother   ? Heart disease Mother   ? Heart disease Other   ? Lung disease Other   ? Diabetes Other   ? Hypertension Other   ? ?Social History  ? ?Socioeconomic History  ? Marital status: Significant Other  ?  Spouse name: Not on file  ? Number of children: Not on file  ? Years of education: Not on file  ? Highest education level: Not on file  ?Occupational History  ? Not on file  ?Tobacco Use  ? Smoking status: Never  ? Smokeless tobacco: Never  ?Vaping Use  ? Vaping Use: Never used   ?Substance and Sexual Activity  ? Alcohol use: Not Currently  ? Drug use: Never  ? Sexual activity: Not Currently  ?Other Topics Concern  ? Not on file  ?Social History Narrative  ? Not on file  ? ?Social Determinants of Health  ? ?Financial Resource Strain: Not on file  ?Food Insecurity: Not on file  ?Transportation Needs: Not on file  ?Physical Activity: Not on file  ?Stress: Not on file  ?Social Connections: Not on file  ? ?Past Surgical History:  ?Procedure Laterality Date  ? KNEE ARTHROSCOPY    ? LAPOROSCOPIC SURGERY    ? UTEREINE  ? ULNAR COLLATERAL LIGAMENT RECONSTRUCTION    ? ?Past Surgical History:  ?Procedure Laterality Date  ? KNEE ARTHROSCOPY    ? LAPOROSCOPIC SURGERY    ? UTEREINE  ? ULNAR COLLATERAL LIGAMENT RECONSTRUCTION    ? ?Past Medical History:  ?Diagnosis Date  ? Back pain   ? Cervicalgia   ? Chronic headaches   ? Chronic pain syndrome   ? Endometriosis   ? Facet syndrome, lumbar   ? Hyperlipidemia   ? Hypertension   ? Lumbar sprain   ? Migraines   ? Myofascial pain   ? Neck pain   ? TMJ syndrome   ? ?Ht '5\' 9"'$  (1.753 m)   BMI  53.61 kg/m?  ? ?Opioid Risk Score:   ?Fall Risk Score:  `1 ? ?Depression screen PHQ 2/9 ? ? ?  02/25/2021  ?  9:24 AM 12/31/2020  ?  9:42 AM 11/11/2020  ? 10:23 AM 08/04/2020  ?  9:12 AM 06/11/2020  ?  8:48 AM 04/15/2020  ?  1:05 PM 02/13/2020  ?  9:11 AM  ?Depression screen PHQ 2/9  ?Decreased Interest 0 0 0 0 0 0 0  ?Down, Depressed, Hopeless 0 0 0 0 0 0 0  ?PHQ - 2 Score 0 0 0 0 0 0 0  ?  ?Review of Systems  ?Musculoskeletal:  Positive for gait problem and neck pain.  ?     Both knees  ?Neurological:  Positive for headaches.  ?All other systems reviewed and are negative. ? ?   ?Objective:  ? Physical Exam ?Vitals and nursing note reviewed.  ?Pulmonary:  ?   Effort: Pulmonary effort is normal.  ?   Breath sounds: Normal breath sounds.  ?Musculoskeletal:  ?   Comments: No Physical Exam Performed: My-Chart Video Visit   ?Neurological:  ?   Mental Status: She is oriented to  person, place, and time.  ?Psychiatric:     ?   Mood and Affect: Mood normal.     ?   Behavior: Behavior normal.  ? ? ? ? ?   ?Assessment & Plan:  ?1.  History of chronic migraines with aura and cervicalgia with myofascial pain.Refilled: Dilaudid '4mg'$  1- tablet 4 hour as needed for severe pain. #12.    Educated on [place] and  sample given, she verbalizes understanding. 04/19/2021 ?2. 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 S/P Lumbar ESI with Dr Letta Pate on 11/11/2020, with good relief noted. Continue with Exercise and use Heat therapy. 04/19/2021 ?Refilled: Oxycodone 5 mg one tablet every 8 hours as needed #90.We will continue the opioid monitoring program, this consists of regular clinic visits, examinations, urine drug screen, pill counts as well as use of [place] Controlled Substance Reporting system. A 12 month History has been reviewed on the [facility] Today.   04/19/2021 ?3.  Primary Bilateral Osteoarthritis of the left knee greater than right with meniscal. Chondromalacia patellae changes.Continue to monitor. Continue HEP as Tolerated. Continue to Monitor. 04/19/2021. ?4. Left elbow pain:.No complaints today. Ortho Following. S/P surgery on  01/27/2019. Continue to monitor. 04/19/2021. ?5. Chronic Left Shoulder pain: No complaints today. Continue HEP as Tolerated. Continue to Monitor.04/19/2021 ?6. Sacral Pain: No complaints today. Continue HEP as Tolerated. Continue to Monitor. Continue current medication regimen. 04/19/2021. ?7. Cervicalgia:  Continue HEP as tolerated. Continue to monitor. 04/19/2021 ?  ?F/U in 2 months  ? ?My: Chart Video Visit ?Established Patient ?Location of Patient: In her Home ?Location of Provider: In the Office  ?  ?  ?

## 2021-04-26 ENCOUNTER — Encounter: Payer: 59 | Attending: Physical Medicine & Rehabilitation | Admitting: Registered Nurse

## 2021-04-26 ENCOUNTER — Encounter: Payer: Self-pay | Admitting: Registered Nurse

## 2021-04-26 VITALS — BP 120/85 | HR 104 | Ht 69.0 in | Wt 363.0 lb

## 2021-04-26 DIAGNOSIS — Z79891 Long term (current) use of opiate analgesic: Secondary | ICD-10-CM | POA: Insufficient documentation

## 2021-04-26 DIAGNOSIS — G894 Chronic pain syndrome: Secondary | ICD-10-CM | POA: Insufficient documentation

## 2021-04-26 DIAGNOSIS — G43109 Migraine with aura, not intractable, without status migrainosus: Secondary | ICD-10-CM | POA: Insufficient documentation

## 2021-04-26 DIAGNOSIS — M47816 Spondylosis without myelopathy or radiculopathy, lumbar region: Secondary | ICD-10-CM | POA: Insufficient documentation

## 2021-04-26 DIAGNOSIS — M7918 Myalgia, other site: Secondary | ICD-10-CM | POA: Insufficient documentation

## 2021-04-26 DIAGNOSIS — Z5181 Encounter for therapeutic drug level monitoring: Secondary | ICD-10-CM | POA: Insufficient documentation

## 2021-04-26 NOTE — Progress Notes (Signed)
? ?Subjective:  ? ? Patient ID: Shannon Meadows, female    DOB: 1970-07-02, 51 y.o.   MRN: [id number] ? ?HPI: Shannon Meadows is a 51 y.o. female who is scheduled for My-Chart Video Visit, Shannon Meadows  and I have  discussed the limitations of evaluation and management by telemedicine and the availability of in person appointments. The patient expressed understanding and agreed to proceed. She states she has a headache this morning and has fibro  pain. She rates her pain 5. Her current exercise regime is walking. ? ?Shannon Meadows equivalent is 118.50 MME.   Last UDS was Performed on 12/31/2021, it was consistent.   ? ?Pain Inventory ?Average Pain 5 ?Pain Right Now 5 ?My pain is intermittent, dull, and aching ? ?In the last 24 hours, has pain interfered with the following? ?General activity 5 ?Relation with others 5 ?Enjoyment of life 5 ?What TIME of day is your pain at its worst? daytime ?Sleep (in general) Poor ? ?Pain is worse with: inactivity and some activites ?Pain improves with: rest and medication ?Relief from Meds: 7 ? ?Family History  ?Problem Relation Age of Onset  ? Hyperlipidemia Father   ? Hypertension Father   ? Arthritis Father   ? Heart disease Father   ? Asthma Mother   ? Diabetes Mother   ? Arthritis Mother   ? Heart disease Mother   ? Heart disease Other   ? Lung disease Other   ? Diabetes Other   ? Hypertension Other   ? ?Social History  ? ?Socioeconomic History  ? Marital status: Significant Other  ?  Spouse name: Not on file  ? Number of children: Not on file  ? Years of education: Not on file  ? Highest education level: Not on file  ?Occupational History  ? Not on file  ?Tobacco Use  ? Smoking status: Never  ? Smokeless tobacco: Never  ?Vaping Use  ? Vaping Use: Never used  ?Substance and Sexual Activity  ? Alcohol use: Not Currently  ? Drug use: Never  ? Sexual activity: Not Currently  ?Other Topics Concern  ? Not on file  ?Social History Narrative  ? Not on file  ? ?Social Determinants of Health   ? ?Financial Resource Strain: Not on file  ?Food Insecurity: Not on file  ?Transportation Needs: Not on file  ?Physical Activity: Not on file  ?Stress: Not on file  ?Social Connections: Not on file  ? ?Past Surgical History:  ?Procedure Laterality Date  ? KNEE ARTHROSCOPY    ? LAPOROSCOPIC SURGERY    ? UTEREINE  ? ULNAR COLLATERAL LIGAMENT RECONSTRUCTION    ? ?Past Surgical History:  ?Procedure Laterality Date  ? KNEE ARTHROSCOPY    ? LAPOROSCOPIC SURGERY    ? UTEREINE  ? ULNAR COLLATERAL LIGAMENT RECONSTRUCTION    ? ?Past Medical History:  ?Diagnosis Date  ? Back pain   ? Cervicalgia   ? Chronic headaches   ? Chronic pain syndrome   ? Endometriosis   ? Facet syndrome, lumbar   ? Hyperlipidemia   ? Hypertension   ? Lumbar sprain   ? Migraines   ? Myofascial pain   ? Neck pain   ? TMJ syndrome   ? ?BP 120/85 Comment: per patient  Pulse (!) 104 Comment: per patient  Ht '5\' 9"'$  (1.753 m)   Wt (!) 363 lb (164.7 kg) Comment: per last visit  SpO2 95% Comment: per patient  BMI 53.61 kg/m?  ? ?  Opioid Risk Score:   ?Fall Risk Score:  `1 ? ?Depression screen PHQ 2/9 ? ? ?  04/26/2021  ?  9:54 AM 04/19/2021  ?  8:38 AM 02/25/2021  ?  9:24 AM 12/31/2020  ?  9:42 AM 11/11/2020  ? 10:23 AM 08/04/2020  ?  9:12 AM 06/11/2020  ?  8:48 AM  ?Depression screen PHQ 2/9  ?Decreased Interest 0 0 0 0 0 0 0  ?Down, Depressed, Hopeless 1 0 0 0 0 0 0  ?PHQ - 2 Score 1 0 0 0 0 0 0  ?  ? ?Review of Systems  ?Constitutional: Negative.   ?HENT: Negative.    ?Eyes: Negative.   ?Respiratory: Negative.    ?Cardiovascular: Negative.   ?Gastrointestinal: Negative.   ?Endocrine: Negative.   ?Genitourinary: Negative.   ?Musculoskeletal: Negative.   ?Skin: Negative.   ?Allergic/Immunologic: Negative.   ?Neurological: Negative.   ?Hematological: Negative.   ?Psychiatric/Behavioral:  Positive for dysphoric mood.   ? ?   ?Objective:  ? Physical Exam ?Vitals and nursing note reviewed.  ?Musculoskeletal:  ?   Comments: No Physical Exam: Telephone visit  ? ? ? ? ?    ?Assessment & Plan:  ?1.  History of chronic migraines with aura and cervicalgia with myofascial pain.Refilled: Dilaudid '4mg'$  1- tablet 4 hour as needed for severe pain. #12.    Educated on [place] and  sample given, she verbalizes understanding. 04/26/2021 ?2. 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 S/P Lumbar ESI with Dr Letta Pate on 11/11/2020, with good relief noted. Continue with Exercise and use Heat therapy. 04/26/2021 ?Refilled: Oxycodone 5 mg one tablet every 8 hours as needed #90.We will continue the opioid monitoring program, this consists of regular clinic visits, examinations, urine drug screen, pill counts as well as use of [place] Controlled Substance Reporting system. A 12 month History has been reviewed on the [place] Today.   04/26/2021 ?3.  Primary Bilateral Osteoarthritis of the left knee greater than right with meniscal. Chondromalacia patellae changes.Continue to monitor. Continue HEP as Tolerated. Continue to Monitor. 04/26/2021. ?4. Left elbow pain:.No complaints today. Ortho Following. S/P surgery on  01/27/2019. Continue to monitor. 04/26/2021. ?5. Chronic Left Shoulder pain: No complaints today. Continue HEP as Tolerated. Continue to Monitor.04/26/2021 ?6. Sacral Pain: No complaints today. Continue HEP as Tolerated. Continue to Monitor. Continue current medication regimen. 04/26/2021. ?7. Cervicalgia:  No complaints today. Continue HEP as tolerated. Continue to monitor. 04/26/2021 ?  ?F/U in 2 months  ?  ?My: Chart Video Visit: Telephone Visit ?Lost Audio and Video Connection ?Established Patient ?Location of Patient: In her Home ?Location of Provider: In the Office  ?Total Time Spent: 10 Minutes ?  ? ? ? ? ? ?

## 2021-05-02 ENCOUNTER — Telehealth: Payer: Self-pay | Admitting: Registered Nurse

## 2021-05-02 DIAGNOSIS — Z5181 Encounter for therapeutic drug level monitoring: Secondary | ICD-10-CM

## 2021-05-02 DIAGNOSIS — G43109 Migraine with aura, not intractable, without status migrainosus: Secondary | ICD-10-CM

## 2021-05-02 DIAGNOSIS — Z79899 Other long term (current) drug therapy: Secondary | ICD-10-CM

## 2021-05-02 DIAGNOSIS — M1712 Unilateral primary osteoarthritis, left knee: Secondary | ICD-10-CM

## 2021-05-02 DIAGNOSIS — M797 Fibromyalgia: Secondary | ICD-10-CM

## 2021-05-02 DIAGNOSIS — G894 Chronic pain syndrome: Secondary | ICD-10-CM

## 2021-05-02 DIAGNOSIS — M26609 Unspecified temporomandibular joint disorder, unspecified side: Secondary | ICD-10-CM

## 2021-05-02 MED ORDER — TIZANIDINE HCL 4 MG PO TABS: 4.0000 mg | ORAL_TABLET | Freq: Three times a day (TID) | ORAL | 0 refills | Status: DC

## 2021-05-02 NOTE — Telephone Encounter (Signed)
Tizanidine re-fill sent. Shannon Meadows is aware of the above, via My-Chart message ?

## 2021-05-14 ENCOUNTER — Encounter: Payer: Self-pay | Admitting: Physical Medicine & Rehabilitation

## 2021-05-14 DIAGNOSIS — M7918 Myalgia, other site: Secondary | ICD-10-CM

## 2021-05-14 DIAGNOSIS — G894 Chronic pain syndrome: Secondary | ICD-10-CM

## 2021-05-14 DIAGNOSIS — Z5181 Encounter for therapeutic drug level monitoring: Secondary | ICD-10-CM

## 2021-05-14 DIAGNOSIS — M170 Bilateral primary osteoarthritis of knee: Secondary | ICD-10-CM

## 2021-05-14 DIAGNOSIS — M47816 Spondylosis without myelopathy or radiculopathy, lumbar region: Secondary | ICD-10-CM

## 2021-05-14 DIAGNOSIS — Z79891 Long term (current) use of opiate analgesic: Secondary | ICD-10-CM

## 2021-05-16 ENCOUNTER — Encounter: Payer: Self-pay | Admitting: *Deleted

## 2021-05-16 ENCOUNTER — Other Ambulatory Visit: Payer: Self-pay | Admitting: Registered Nurse

## 2021-05-16 MED ORDER — OXYCODONE HCL 5 MG PO TABS: 5.0000 mg | ORAL_TABLET | Freq: Three times a day (TID) | ORAL | 0 refills | Status: DC | PRN

## 2021-05-16 MED ORDER — ETODOLAC 400 MG PO TABS: 400.0000 mg | ORAL_TABLET | Freq: Two times a day (BID) | ORAL | 1 refills | Status: DC | PRN

## 2021-05-16 MED ORDER — HYDROMORPHONE HCL 4 MG PO TABS: 4.0000 mg | ORAL_TABLET | ORAL | 0 refills | Status: DC | PRN

## 2021-05-17 ENCOUNTER — Other Ambulatory Visit: Payer: Self-pay | Admitting: Registered Nurse

## 2021-05-17 DIAGNOSIS — G43109 Migraine with aura, not intractable, without status migrainosus: Secondary | ICD-10-CM

## 2021-05-17 DIAGNOSIS — Z79899 Other long term (current) drug therapy: Secondary | ICD-10-CM

## 2021-05-17 DIAGNOSIS — G894 Chronic pain syndrome: Secondary | ICD-10-CM

## 2021-05-17 DIAGNOSIS — M797 Fibromyalgia: Secondary | ICD-10-CM

## 2021-05-17 DIAGNOSIS — Z5181 Encounter for therapeutic drug level monitoring: Secondary | ICD-10-CM

## 2021-05-17 DIAGNOSIS — M26609 Unspecified temporomandibular joint disorder, unspecified side: Secondary | ICD-10-CM

## 2021-05-17 DIAGNOSIS — M1712 Unilateral primary osteoarthritis, left knee: Secondary | ICD-10-CM

## 2021-05-17 MED ORDER — HYDROMORPHONE HCL 4 MG PO TABS: 4.0000 mg | ORAL_TABLET | ORAL | 0 refills | Status: DC | PRN

## 2021-05-17 MED ORDER — OXYCODONE HCL 5 MG PO TABS: 5.0000 mg | ORAL_TABLET | Freq: Three times a day (TID) | ORAL | 0 refills | Status: DC | PRN

## 2021-05-17 NOTE — Addendum Note (Signed)
Addended by: Alger Simons T on: 05/17/2021 12:42 PM ? ? Modules accepted: Orders ? ?

## 2021-05-17 NOTE — Telephone Encounter (Signed)
Rx resent.

## 2021-06-13 ENCOUNTER — Other Ambulatory Visit: Payer: Self-pay | Admitting: Registered Nurse

## 2021-06-16 ENCOUNTER — Other Ambulatory Visit: Payer: Self-pay | Admitting: Physical Medicine & Rehabilitation

## 2021-06-16 DIAGNOSIS — M1712 Unilateral primary osteoarthritis, left knee: Secondary | ICD-10-CM

## 2021-06-16 DIAGNOSIS — G894 Chronic pain syndrome: Secondary | ICD-10-CM

## 2021-06-16 DIAGNOSIS — M797 Fibromyalgia: Secondary | ICD-10-CM

## 2021-06-16 DIAGNOSIS — M26609 Unspecified temporomandibular joint disorder, unspecified side: Secondary | ICD-10-CM

## 2021-06-16 DIAGNOSIS — Z5181 Encounter for therapeutic drug level monitoring: Secondary | ICD-10-CM

## 2021-06-16 DIAGNOSIS — Z79899 Other long term (current) drug therapy: Secondary | ICD-10-CM

## 2021-06-16 DIAGNOSIS — G43109 Migraine with aura, not intractable, without status migrainosus: Secondary | ICD-10-CM

## 2021-06-16 NOTE — Telephone Encounter (Signed)
Refill request for Tizanidine to [facility] in [place]

## 2021-06-17 ENCOUNTER — Encounter: Payer: 59 | Attending: Physical Medicine & Rehabilitation | Admitting: Registered Nurse

## 2021-06-17 VITALS — BP 119/71 | HR 98 | Ht 69.0 in | Wt 377.0 lb

## 2021-06-17 DIAGNOSIS — M47816 Spondylosis without myelopathy or radiculopathy, lumbar region: Secondary | ICD-10-CM | POA: Insufficient documentation

## 2021-06-17 DIAGNOSIS — G43109 Migraine with aura, not intractable, without status migrainosus: Secondary | ICD-10-CM | POA: Insufficient documentation

## 2021-06-17 DIAGNOSIS — M170 Bilateral primary osteoarthritis of knee: Secondary | ICD-10-CM | POA: Diagnosis present

## 2021-06-17 DIAGNOSIS — G894 Chronic pain syndrome: Secondary | ICD-10-CM | POA: Insufficient documentation

## 2021-06-17 DIAGNOSIS — Z79891 Long term (current) use of opiate analgesic: Secondary | ICD-10-CM | POA: Insufficient documentation

## 2021-06-17 DIAGNOSIS — Z5181 Encounter for therapeutic drug level monitoring: Secondary | ICD-10-CM | POA: Diagnosis not present

## 2021-06-17 DIAGNOSIS — M797 Fibromyalgia: Secondary | ICD-10-CM | POA: Insufficient documentation

## 2021-06-17 DIAGNOSIS — M7918 Myalgia, other site: Secondary | ICD-10-CM

## 2021-06-17 MED ORDER — OXYCODONE HCL 5 MG PO TABS: 5.0000 mg | ORAL_TABLET | Freq: Three times a day (TID) | ORAL | 0 refills | Status: DC | PRN

## 2021-06-17 MED ORDER — HYDROMORPHONE HCL 4 MG PO TABS: 4.0000 mg | ORAL_TABLET | ORAL | 0 refills | Status: DC | PRN

## 2021-06-17 NOTE — Progress Notes (Unsigned)
Subjective:    Patient ID: Shannon Meadows, female    DOB: May 18, 1970, 51 y.o.   MRN: [id number]  HPI: Shannon Meadows is a 51 y.o. female who returns for follow up appointment for chronic pain and medication refill. states *** pain is located in  ***. rates pain ***. current exercise regime is walking and performing stretching exercises.    Pain Inventory Average Pain 7 Pain Right Now 7 My pain is sharp, burning, dull, stabbing, and aching  In the last 24 hours, has pain interfered with the following? General activity 7 Relation with others 7 Enjoyment of life 7 What TIME of day is your pain at its worst? daytime Sleep (in general) Poor  Pain is worse with: walking, bending, inactivity, standing, and some activites Pain improves with: rest, heat/ice, pacing activities, medication, and injections Relief from Meds: 5  Family History  Problem Relation Age of Onset   Hyperlipidemia Father    Hypertension Father    Arthritis Father    Heart disease Father    Asthma Mother    Diabetes Mother    Arthritis Mother    Heart disease Mother    Heart disease Other    Lung disease Other    Diabetes Other    Hypertension Other    Social History   Socioeconomic History   Marital status: Significant Other    Spouse name: Not on file   Number of children: Not on file   Years of education: Not on file   Highest education level: Not on file  Occupational History   Not on file  Tobacco Use   Smoking status: Never   Smokeless tobacco: Never  Vaping Use   Vaping Use: Never used  Substance and Sexual Activity   Alcohol use: Not Currently   Drug use: Never   Sexual activity: Not Currently  Other Topics Concern   Not on file  Social History Narrative   Not on file   Social Determinants of Health   Financial Resource Strain: Not on file  Food Insecurity: Not on file  Transportation Needs: Not on file  Physical Activity: Not on file  Stress: Not on file  Social Connections: Not  on file   Past Surgical History:  Procedure Laterality Date   KNEE ARTHROSCOPY     LAPOROSCOPIC SURGERY     UTEREINE   ULNAR COLLATERAL LIGAMENT RECONSTRUCTION     Past Surgical History:  Procedure Laterality Date   KNEE ARTHROSCOPY     LAPOROSCOPIC SURGERY     UTEREINE   ULNAR COLLATERAL LIGAMENT RECONSTRUCTION     Past Medical History:  Diagnosis Date   Back pain    Cervicalgia    Chronic headaches    Chronic pain syndrome    Endometriosis    Facet syndrome, lumbar    Hyperlipidemia    Hypertension    Lumbar sprain    Migraines    Myofascial pain    Neck pain    TMJ syndrome    BP 119/71   Pulse 98   Ht '5\' 9"'$  (1.753 m)   Wt (!) 377 lb (171 kg)   SpO2 93%   BMI 55.67 kg/m   Opioid Risk Score:   Fall Risk Score:  `1  Depression screen [facility] 2/9     04/26/2021    9:54 AM 04/19/2021    8:38 AM 02/25/2021    9:24 AM 12/31/2020    9:42 AM 11/11/2020   10:23 AM 08/04/2020  9:12 AM 06/11/2020    8:48 AM  Depression screen PHQ 2/9  Decreased Interest 0 0 0 0 0 0 0  Down, Depressed, Hopeless 1 0 0 0 0 0 0  PHQ - 2 Score 1 0 0 0 0 0 0     Review of Systems  Musculoskeletal:  Positive for back pain.       Knee pain  Neurological:  Positive for headaches.  All other systems reviewed and are negative.     Objective:   Physical Exam        Assessment & Plan:  1.  History of chronic migraines with aura and cervicalgia with myofascial pain.Refilled: Dilaudid '4mg'$  1- tablet 4 hour as needed for severe pain. #12.    Educated on [place] and  sample given, she verbalizes understanding. 04/26/2021 2. 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 S/P Lumbar ESI with Dr Letta Pate on 11/11/2020, with good relief noted. Continue with Exercise and use Heat therapy. 04/26/2021 Refilled: Oxycodone 5 mg one tablet every 8 hours as needed #90.We will continue the opioid monitoring program, this consists of regular clinic visits, examinations, urine drug screen,  pill counts as well as use of [place] Controlled Substance Reporting system. A 12 month History has been reviewed on the [place] Today.   04/26/2021 3.  Primary Bilateral Osteoarthritis of the left knee greater than right with meniscal. Chondromalacia patellae changes.Continue to monitor. Continue HEP as Tolerated. Continue to Monitor. 04/26/2021. 4. Left elbow pain:.No complaints today. Ortho Following. S/P surgery on  01/27/2019. Continue to monitor. 04/26/2021. 5. Chronic Left Shoulder pain: No complaints today. Continue HEP as Tolerated. Continue to Monitor.04/26/2021 6. Sacral Pain: No complaints today. Continue HEP as Tolerated. Continue to Monitor. Continue current medication regimen. 04/26/2021. 7. Cervicalgia:  No complaints today. Continue HEP as tolerated. Continue to monitor. 04/26/2021   F/U in 2 months

## 2021-06-23 LAB — TOXASSURE SELECT,+ANTIDEPR,UR

## 2021-06-24 ENCOUNTER — Encounter: Payer: Self-pay | Admitting: Registered Nurse

## 2021-06-24 ENCOUNTER — Telehealth: Payer: Self-pay | Admitting: *Deleted

## 2021-06-24 NOTE — Telephone Encounter (Signed)
Urine drug screen for this encounter is consistent for prescribed medication 

## 2021-07-03 ENCOUNTER — Other Ambulatory Visit: Payer: Self-pay | Admitting: Registered Nurse

## 2021-07-03 DIAGNOSIS — M1712 Unilateral primary osteoarthritis, left knee: Secondary | ICD-10-CM

## 2021-07-03 DIAGNOSIS — M797 Fibromyalgia: Secondary | ICD-10-CM

## 2021-07-03 DIAGNOSIS — Z79899 Other long term (current) drug therapy: Secondary | ICD-10-CM

## 2021-07-03 DIAGNOSIS — G894 Chronic pain syndrome: Secondary | ICD-10-CM

## 2021-07-03 DIAGNOSIS — M26609 Unspecified temporomandibular joint disorder, unspecified side: Secondary | ICD-10-CM

## 2021-07-03 DIAGNOSIS — G43109 Migraine with aura, not intractable, without status migrainosus: Secondary | ICD-10-CM

## 2021-07-03 DIAGNOSIS — Z5181 Encounter for therapeutic drug level monitoring: Secondary | ICD-10-CM

## 2021-07-04 NOTE — Telephone Encounter (Signed)
Placed a call to Shannon Meadows,  She is taking her Tizanidine every 8 hours for muscle spasms in her neck and lumbar.  Tizanidine prescription sent to pharmacy, she verbalizes understanding.

## 2021-07-04 NOTE — Telephone Encounter (Signed)
Refill request for Tizanidine. Not in last note.

## 2021-07-14 ENCOUNTER — Telehealth: Payer: Self-pay | Admitting: Registered Nurse

## 2021-07-14 DIAGNOSIS — Z79891 Long term (current) use of opiate analgesic: Secondary | ICD-10-CM

## 2021-07-14 DIAGNOSIS — M170 Bilateral primary osteoarthritis of knee: Secondary | ICD-10-CM

## 2021-07-14 DIAGNOSIS — Z5181 Encounter for therapeutic drug level monitoring: Secondary | ICD-10-CM

## 2021-07-14 DIAGNOSIS — G894 Chronic pain syndrome: Secondary | ICD-10-CM

## 2021-07-14 DIAGNOSIS — M7918 Myalgia, other site: Secondary | ICD-10-CM

## 2021-07-14 DIAGNOSIS — M47816 Spondylosis without myelopathy or radiculopathy, lumbar region: Secondary | ICD-10-CM

## 2021-07-14 MED ORDER — OXYCODONE HCL 5 MG PO TABS: 5.0000 mg | ORAL_TABLET | Freq: Three times a day (TID) | ORAL | 0 refills | Status: DC | PRN

## 2021-07-14 MED ORDER — HYDROMORPHONE HCL 4 MG PO TABS: 4.0000 mg | ORAL_TABLET | ORAL | 0 refills | Status: DC | PRN

## 2021-07-14 NOTE — Telephone Encounter (Signed)
PMP was Reviewed.  Hydromorphone and Oxycodone e-scribed today.  Ms. Krah is aware via My-Chart message

## 2021-07-18 MED ORDER — HYDROMORPHONE HCL 4 MG PO TABS: 4.0000 mg | ORAL_TABLET | ORAL | 0 refills | Status: DC | PRN

## 2021-07-18 MED ORDER — OXYCODONE HCL 5 MG PO TABS: 5.0000 mg | ORAL_TABLET | Freq: Three times a day (TID) | ORAL | 0 refills | Status: DC | PRN

## 2021-07-18 NOTE — Telephone Encounter (Signed)
Rxes sent 

## 2021-08-05 ENCOUNTER — Ambulatory Visit: Payer: Self-pay | Admitting: Registered Nurse

## 2021-08-08 ENCOUNTER — Encounter: Payer: Self-pay | Admitting: Registered Nurse

## 2021-08-08 ENCOUNTER — Encounter: Payer: Self-pay | Attending: Registered Nurse | Admitting: Registered Nurse

## 2021-08-08 VITALS — BP 135/85 | HR 87 | Ht 69.0 in | Wt 368.2 lb

## 2021-08-08 DIAGNOSIS — M47816 Spondylosis without myelopathy or radiculopathy, lumbar region: Secondary | ICD-10-CM | POA: Insufficient documentation

## 2021-08-08 DIAGNOSIS — Z5181 Encounter for therapeutic drug level monitoring: Secondary | ICD-10-CM | POA: Insufficient documentation

## 2021-08-08 DIAGNOSIS — Z79891 Long term (current) use of opiate analgesic: Secondary | ICD-10-CM | POA: Insufficient documentation

## 2021-08-08 DIAGNOSIS — M1712 Unilateral primary osteoarthritis, left knee: Secondary | ICD-10-CM | POA: Insufficient documentation

## 2021-08-08 DIAGNOSIS — M170 Bilateral primary osteoarthritis of knee: Secondary | ICD-10-CM | POA: Insufficient documentation

## 2021-08-08 DIAGNOSIS — G894 Chronic pain syndrome: Secondary | ICD-10-CM | POA: Insufficient documentation

## 2021-08-08 DIAGNOSIS — G43109 Migraine with aura, not intractable, without status migrainosus: Secondary | ICD-10-CM | POA: Insufficient documentation

## 2021-08-08 DIAGNOSIS — M7918 Myalgia, other site: Secondary | ICD-10-CM | POA: Insufficient documentation

## 2021-08-08 MED ORDER — HYDROMORPHONE HCL 4 MG PO TABS
4.0000 mg | ORAL_TABLET | ORAL | 0 refills | Status: DC | PRN
Start: 2021-08-08 — End: 2021-09-05

## 2021-08-08 MED ORDER — ETODOLAC 400 MG PO TABS: 400.0000 mg | ORAL_TABLET | Freq: Two times a day (BID) | ORAL | 2 refills | Status: DC | PRN

## 2021-08-08 MED ORDER — OXYCODONE HCL 5 MG PO TABS: 5.0000 mg | ORAL_TABLET | Freq: Three times a day (TID) | ORAL | 0 refills | Status: DC | PRN

## 2021-08-08 NOTE — Progress Notes (Signed)
Subjective:    Patient ID: Shannon Meadows, female    DOB: 1970/12/27, 51 y.o.   MRN: [id number]  HPI: Shannon Meadows is a 51 y.o. female who returns for follow up appointment for chronic pain and medication refill. She states her pain is located in her lower back, left knee and she states she has a migraine today, that is easing off, it started over the weekend she states.She  rates her pain 6. Her current exercise regime is walking and performing stretching exercises.  Shannon Meadows Morphine equivalent is  36.33MME.   Last UDS was Performed on 06/17/2021, it was consistent.      Pain Inventory Average Pain 6 Pain Right Now 6 My pain is intermittent, constant, sharp, burning, dull, stabbing, tingling, and aching  In the last 24 hours, has pain interfered with the following? General activity 7 Relation with others 3 Enjoyment of life 6 What TIME of day is your pain at its worst? daytime Sleep (in general) Fair  Pain is worse with: some activites Pain improves with: heat/ice, pacing activities, medication, and injections Relief from Meds: 5  Family History  Problem Relation Age of Onset   Hyperlipidemia Father    Hypertension Father    Arthritis Father    Heart disease Father    Asthma Mother    Diabetes Mother    Arthritis Mother    Heart disease Mother    Heart disease Other    Lung disease Other    Diabetes Other    Hypertension Other    Social History   Socioeconomic History   Marital status: Significant Other    Spouse name: Not on file   Number of children: Not on file   Years of education: Not on file   Highest education level: Not on file  Occupational History   Not on file  Tobacco Use   Smoking status: Never   Smokeless tobacco: Never  Vaping Use   Vaping Use: Never used  Substance and Sexual Activity   Alcohol use: Not Currently   Drug use: Never   Sexual activity: Not Currently  Other Topics Concern   Not on file  Social History Narrative   Not on  file   Social Determinants of Health   Financial Resource Strain: Not on file  Food Insecurity: Not on file  Transportation Needs: Not on file  Physical Activity: Not on file  Stress: Not on file  Social Connections: Not on file   Past Surgical History:  Procedure Laterality Date   KNEE ARTHROSCOPY     LAPOROSCOPIC SURGERY     UTEREINE   ULNAR COLLATERAL LIGAMENT RECONSTRUCTION     Past Surgical History:  Procedure Laterality Date   KNEE ARTHROSCOPY     LAPOROSCOPIC SURGERY     UTEREINE   ULNAR COLLATERAL LIGAMENT RECONSTRUCTION     Past Medical History:  Diagnosis Date   Back pain    Cervicalgia    Chronic headaches    Chronic pain syndrome    Endometriosis    Facet syndrome, lumbar    Hyperlipidemia    Hypertension    Lumbar sprain    Migraines    Myofascial pain    Neck pain    TMJ syndrome    Ht '5\' 9"'$  (1.753 m)   Wt (!) 368 lb 3.2 oz (167 kg)   BMI 54.37 kg/m   Opioid Risk Score:   Fall Risk Score:  `1  Depression screen [facility] 2/9  04/26/2021    9:54 AM 04/19/2021    8:38 AM 02/25/2021    9:24 AM 12/31/2020    9:42 AM 11/11/2020   10:23 AM 08/04/2020    9:12 AM 06/11/2020    8:48 AM  Depression screen PHQ 2/9  Decreased Interest 0 0 0 0 0 0 0  Down, Depressed, Hopeless 1 0 0 0 0 0 0  PHQ - 2 Score 1 0 0 0 0 0 0    Review of Systems  Musculoskeletal:  Positive for back pain and neck pain.       PAIN IN BOTH KNEES  Neurological:  Positive for headaches.  All other systems reviewed and are negative.      Objective:   Physical Exam Vitals and nursing note reviewed.  Constitutional:      Appearance: Normal appearance. She is obese.  Cardiovascular:     Rate and Rhythm: Normal rate and regular rhythm.     Pulses: Normal pulses.     Heart sounds: Normal heart sounds.  Pulmonary:     Effort: Pulmonary effort is normal.     Breath sounds: Normal breath sounds.  Musculoskeletal:     Cervical back: Normal range of motion and neck supple.      Comments: Normal Muscle Bulk and Muscle Testing Reveals:  Upper Extremities:Full  ROM and Muscle Strength 5/5  Lumbar Paraspinal Tenderness: L--L-5 Lower Extremities: Full ROM and Muscle Strength 5/5 Arises from Table slowly Narrow Based  Gait     Skin:    General: Skin is warm and dry.  Neurological:     Mental Status: She is alert and oriented to person, place, and time.  Psychiatric:        Mood and Affect: Mood normal.        Behavior: Behavior normal.         Assessment & Plan:  1.  History of chronic migraines with aura and cervicalgia with myofascial pain.Refilled: Dilaudid '4mg'$  1- tablet 4 hour as needed for severe pain. #12.    Educated on [place] and  sample given, she verbalizes understanding. 08/04/2021 2. 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 S/P Lumbar ESI with Dr Letta Pate on 11/11/2020, with good relief noted. Continue with Exercise and use Heat therapy. 08/04/2021 Refilled: Oxycodone 5 mg one tablet every 8 hours as needed #90.We will continue the opioid monitoring program, this consists of regular clinic visits, examinations, urine drug screen, pill counts as well as use of [place] Controlled Substance Reporting system. A 12 month History has been reviewed on the [place] Today.   08/04/2021 3.  Primary Bilateral Osteoarthritis of the left knee greater than right with meniscal. Chondromalacia patellae changes.Continue to monitor. Continue HEP as Tolerated. Continue to Monitor. 08/04/2021. 4. Left elbow pain:.No complaints today. Ortho Following. S/P surgery on  01/27/2019. Continue to monitor. 08/04/2021. 5. Chronic Left Shoulder pain: No complaints today. Continue HEP as Tolerated. Continue to Monitor.08/04/2021 6. Sacral Pain: No complaints today. Continue HEP as Tolerated. Continue to Monitor. Continue current medication regimen. 08/04/2021. 7. Cervicalgia:  No complaints today. Continue HEP as  tolerated. Continue to monitor. 08/04/2021   F/U in 2 months

## 2021-09-05 ENCOUNTER — Telehealth: Payer: Self-pay | Admitting: Registered Nurse

## 2021-09-05 DIAGNOSIS — M170 Bilateral primary osteoarthritis of knee: Secondary | ICD-10-CM

## 2021-09-05 DIAGNOSIS — Z5181 Encounter for therapeutic drug level monitoring: Secondary | ICD-10-CM

## 2021-09-05 DIAGNOSIS — M7918 Myalgia, other site: Secondary | ICD-10-CM

## 2021-09-05 DIAGNOSIS — G894 Chronic pain syndrome: Secondary | ICD-10-CM

## 2021-09-05 DIAGNOSIS — Z79891 Long term (current) use of opiate analgesic: Secondary | ICD-10-CM

## 2021-09-05 MED ORDER — HYDROMORPHONE HCL 4 MG PO TABS: 4.0000 mg | ORAL_TABLET | ORAL | 0 refills | Status: DC | PRN

## 2021-09-05 MED ORDER — OXYCODONE HCL 5 MG PO TABS: 5.0000 mg | ORAL_TABLET | Freq: Three times a day (TID) | ORAL | 0 refills | Status: DC | PRN

## 2021-09-05 NOTE — Telephone Encounter (Signed)
PMP was Reviewed.  Dilaudis and Oxycodone e-scribe to pharmacy.  Shannon Meadows is aware of the above via My-Chart message.

## 2021-10-06 ENCOUNTER — Other Ambulatory Visit: Payer: Self-pay | Admitting: Registered Nurse

## 2021-10-06 DIAGNOSIS — M170 Bilateral primary osteoarthritis of knee: Secondary | ICD-10-CM

## 2021-10-06 DIAGNOSIS — Z5181 Encounter for therapeutic drug level monitoring: Secondary | ICD-10-CM

## 2021-10-06 DIAGNOSIS — M7918 Myalgia, other site: Secondary | ICD-10-CM

## 2021-10-06 DIAGNOSIS — M797 Fibromyalgia: Secondary | ICD-10-CM

## 2021-10-06 DIAGNOSIS — G894 Chronic pain syndrome: Secondary | ICD-10-CM

## 2021-10-11 ENCOUNTER — Telehealth: Payer: Self-pay | Admitting: Registered Nurse

## 2021-10-12 ENCOUNTER — Encounter: Payer: Self-pay | Attending: Registered Nurse | Admitting: Registered Nurse

## 2021-10-12 ENCOUNTER — Encounter: Payer: Self-pay | Admitting: Registered Nurse

## 2021-10-12 VITALS — Ht 69.0 in | Wt 340.0 lb

## 2021-10-12 DIAGNOSIS — W19XXXD Unspecified fall, subsequent encounter: Secondary | ICD-10-CM

## 2021-10-12 DIAGNOSIS — G894 Chronic pain syndrome: Secondary | ICD-10-CM

## 2021-10-12 DIAGNOSIS — M170 Bilateral primary osteoarthritis of knee: Secondary | ICD-10-CM

## 2021-10-12 DIAGNOSIS — M7918 Myalgia, other site: Secondary | ICD-10-CM

## 2021-10-12 DIAGNOSIS — Z79891 Long term (current) use of opiate analgesic: Secondary | ICD-10-CM

## 2021-10-12 DIAGNOSIS — G43109 Migraine with aura, not intractable, without status migrainosus: Secondary | ICD-10-CM

## 2021-10-12 DIAGNOSIS — Z5181 Encounter for therapeutic drug level monitoring: Secondary | ICD-10-CM

## 2021-10-12 DIAGNOSIS — M5416 Radiculopathy, lumbar region: Secondary | ICD-10-CM

## 2021-10-12 MED ORDER — OXYCODONE HCL 5 MG PO TABS: 5.0000 mg | ORAL_TABLET | Freq: Three times a day (TID) | ORAL | 0 refills | Status: DC | PRN

## 2021-10-12 MED ORDER — HYDROMORPHONE HCL 4 MG PO TABS: 4.0000 mg | ORAL_TABLET | ORAL | 0 refills | Status: DC | PRN

## 2021-10-12 MED ORDER — OXYCODONE HCL 5 MG PO TABS
5.0000 mg | ORAL_TABLET | Freq: Three times a day (TID) | ORAL | 0 refills | Status: DC | PRN
Start: 2021-10-12 — End: 2021-10-12

## 2021-10-12 NOTE — Progress Notes (Signed)
Subjective:    Patient ID: Shannon Meadows, female    DOB: Jul 11, 1970, 51 y.o.   MRN: [id number]  HPI: Shannon Meadows is a 51 y.o. female who is scheduled for My-Chart visit, she reports she had a fall at work on 08/18/2021. She was evaluated in ED and had a Urgent Care F/U appointment and awaiting a referral to Orthopedic she states. Ms. Pinkham and I  have  discussed the limitations of evaluation and management by telemedicine and the availability of in person appointments. The patient expressed understanding and agreed to proceed.  She states her pain is located in her lower back radiating into her right lower extremity and right foot. She also reports she awaken with a migraine this morning. She rates her pain 9. She also states she is on bedrest at this time.   Ms. Danh Morphine equivalent is 32.22 MME.   Last UDS was Performed on 06/17/2021, it was consistent.     Pain Inventory Average Pain 9 Pain Right Now 9 My pain is constant, sharp, burning, stabbing, tingling, and aching  In the last 24 hours, has pain interfered with the following? General activity 9 Relation with others 9 Enjoyment of life 9 What TIME of day is your pain at its worst? morning , daytime, evening, and night Sleep (in general) Poor  Pain is worse with: walking, bending, sitting, standing, and some activites Pain improves with: medication Relief from Meds: 5  Family History  Problem Relation Age of Onset   Hyperlipidemia Father    Hypertension Father    Arthritis Father    Heart disease Father    Asthma Mother    Diabetes Mother    Arthritis Mother    Heart disease Mother    Heart disease Other    Lung disease Other    Diabetes Other    Hypertension Other    Social History   Socioeconomic History   Marital status: Significant Other    Spouse name: Not on file   Number of children: Not on file   Years of education: Not on file   Highest education level: Not on file  Occupational History   Not on  file  Tobacco Use   Smoking status: Never   Smokeless tobacco: Never  Vaping Use   Vaping Use: Never used  Substance and Sexual Activity   Alcohol use: Not Currently   Drug use: Never   Sexual activity: Not Currently  Other Topics Concern   Not on file  Social History Narrative   Not on file   Social Determinants of Health   Financial Resource Strain: Not on file  Food Insecurity: Not on file  Transportation Needs: Not on file  Physical Activity: Not on file  Stress: Not on file  Social Connections: Not on file   Past Surgical History:  Procedure Laterality Date   KNEE ARTHROSCOPY     LAPOROSCOPIC SURGERY     UTEREINE   ULNAR COLLATERAL LIGAMENT RECONSTRUCTION     Past Surgical History:  Procedure Laterality Date   KNEE ARTHROSCOPY     LAPOROSCOPIC SURGERY     UTEREINE   ULNAR COLLATERAL LIGAMENT RECONSTRUCTION     Past Medical History:  Diagnosis Date   Back pain    Cervicalgia    Chronic headaches    Chronic pain syndrome    Endometriosis    Facet syndrome, lumbar    Hyperlipidemia    Hypertension    Lumbar sprain  Migraines    Myofascial pain    Neck pain    TMJ syndrome    Ht '5\' 9"'$  (1.753 m)   Wt (!) 340 lb (154.2 kg)   BMI 50.21 kg/m   Opioid Risk Score:   Fall Risk Score:  `1  Depression screen [facility] 2/9     08/08/2021    8:35 AM 04/26/2021    9:54 AM 04/19/2021    8:38 AM 02/25/2021    9:24 AM 12/31/2020    9:42 AM 11/11/2020   10:23 AM 08/04/2020    9:12 AM  Depression screen PHQ 2/9  Decreased Interest 0 0 0 0 0 0 0  Down, Depressed, Hopeless 0 1 0 0 0 0 0  PHQ - 2 Score 0 1 0 0 0 0 0     Review of Systems  Musculoskeletal:  Positive for back pain.       Hips pain Buttocks pain going down right leg to toes  All other systems reviewed and are negative.     Objective:   Physical Exam Vitals and nursing note reviewed.  Musculoskeletal:     Comments: No Physical Exam Performed: My Chart Visit          Assessment & Plan:  1.   History of chronic migraines with aura and cervicalgia with myofascial pain.Refilled: Dilaudid '4mg'$  1- tablet 4 hour as needed for severe pain. #12.    Educated on [place] and  sample given, she verbalizes understanding. 10/12/2021 2. 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 S/P Lumbar ESI with Dr Letta Pate on 11/11/2020, with good relief noted. Continue with Exercise and use Heat therapy. 10/12/2021 Refilled: Oxycodone 5 mg one tablet every 8 hours as needed #90.We will continue the opioid monitoring program, this consists of regular clinic visits, examinations, urine drug screen, pill counts as well as use of [place] Controlled Substance Reporting system. A 12 month History has been reviewed on the [place] Today.   10/12/2021 3.  Primary Bilateral Osteoarthritis of the left knee greater than right with meniscal. Chondromalacia patellae changes.Continue to monitor. Continue HEP as Tolerated. Continue to Monitor. 10/12/2021. 4. Left elbow pain:.No complaints today. Ortho Following. S/P surgery on  01/27/2019. Continue to monitor. 10/12/2021. 5. Chronic Left Shoulder pain: No complaints today. Continue HEP as Tolerated. Continue to Monitor.10/12/2021 6. Sacral Pain: No complaints today. Continue HEP as Tolerated. Continue to Monitor. Continue current medication regimen. 10/12/2021. 7. Cervicalgia:  No complaints today. Continue HEP as tolerated. Continue to monitor. 10/12/2021 8. 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 She was evaluated in ED and had a Urgent Care evaluation she reports. She is awaiting on Orthopedic referral.   F/U in 2 months   My Chart Visit Established Patient Location of Patient: in her Home Location of Provider: In the Office

## 2021-11-09 ENCOUNTER — Telehealth: Payer: Self-pay | Admitting: Registered Nurse

## 2021-11-09 NOTE — Telephone Encounter (Signed)
Medication list Reviewed.  She was instructed to call pharmacy, post dated for 11/07/2021. She verbalizes understanding.

## 2021-11-21 ENCOUNTER — Telehealth: Payer: Self-pay | Admitting: Registered Nurse

## 2021-11-21 MED ORDER — PREGABALIN 300 MG PO CAPS: 300.0000 mg | ORAL_CAPSULE | Freq: Two times a day (BID) | ORAL | 2 refills | Status: DC

## 2021-11-21 NOTE — Telephone Encounter (Signed)
PMP was Reviewed.  Lyrica e-scribed today.  Shannon Meadows is aware via My-Chart message

## 2021-11-27 ENCOUNTER — Other Ambulatory Visit: Payer: Self-pay | Admitting: Registered Nurse

## 2021-11-28 NOTE — Telephone Encounter (Signed)
Medication refill sent to pharmacy  

## 2021-12-06 ENCOUNTER — Other Ambulatory Visit: Payer: Self-pay

## 2021-12-07 ENCOUNTER — Encounter: Payer: Self-pay | Attending: Registered Nurse | Admitting: Registered Nurse

## 2021-12-07 ENCOUNTER — Encounter: Payer: Self-pay | Admitting: Registered Nurse

## 2021-12-07 VITALS — BP 116/82 | HR 92 | Ht 69.0 in | Wt 373.0 lb

## 2021-12-07 DIAGNOSIS — G43109 Migraine with aura, not intractable, without status migrainosus: Secondary | ICD-10-CM | POA: Insufficient documentation

## 2021-12-07 DIAGNOSIS — M797 Fibromyalgia: Secondary | ICD-10-CM | POA: Insufficient documentation

## 2021-12-07 DIAGNOSIS — M7061 Trochanteric bursitis, right hip: Secondary | ICD-10-CM | POA: Insufficient documentation

## 2021-12-07 DIAGNOSIS — M170 Bilateral primary osteoarthritis of knee: Secondary | ICD-10-CM | POA: Insufficient documentation

## 2021-12-07 DIAGNOSIS — G894 Chronic pain syndrome: Secondary | ICD-10-CM | POA: Insufficient documentation

## 2021-12-07 DIAGNOSIS — M5416 Radiculopathy, lumbar region: Secondary | ICD-10-CM | POA: Insufficient documentation

## 2021-12-07 DIAGNOSIS — Z79891 Long term (current) use of opiate analgesic: Secondary | ICD-10-CM | POA: Insufficient documentation

## 2021-12-07 DIAGNOSIS — Z5181 Encounter for therapeutic drug level monitoring: Secondary | ICD-10-CM | POA: Insufficient documentation

## 2021-12-07 DIAGNOSIS — M7918 Myalgia, other site: Secondary | ICD-10-CM | POA: Insufficient documentation

## 2021-12-07 MED ORDER — HYDROMORPHONE HCL 4 MG PO TABS: 4.0000 mg | ORAL_TABLET | ORAL | 0 refills | Status: DC | PRN

## 2021-12-07 MED ORDER — OXYCODONE HCL 5 MG PO TABS: 5.0000 mg | ORAL_TABLET | Freq: Four times a day (QID) | ORAL | 0 refills | Status: DC | PRN

## 2021-12-07 MED ORDER — PREGABALIN 300 MG PO CAPS: 300.0000 mg | ORAL_CAPSULE | Freq: Two times a day (BID) | ORAL | 2 refills | Status: DC

## 2021-12-07 MED ORDER — TIZANIDINE HCL 4 MG PO TABS: 4.0000 mg | ORAL_TABLET | Freq: Three times a day (TID) | ORAL | 1 refills | Status: DC

## 2021-12-07 NOTE — Progress Notes (Signed)
Subjective:    Patient ID: Shannon Meadows, female    DOB: [birth year], 51 y.o.   MRN: [id number]  HPI: Shannon Meadows is a 51 y.o. female who returns for follow up appointment for chronic pain and medication refill. She states her pain is located in her lower back radiating into her right hip and right lower extremity, also reports increase intensity of lower back pain since her fall 2 months ago, Ortho following she states. She rates her pain 8. Her current exercise regime is walking short distances   Shannon Meadows Morphine equivalent is 32.22 MME.  Oral Swab was Performed today.       Pain Inventory Average Pain 9 Pain Right Now 8 My pain is intermittent, constant, sharp, burning, dull, stabbing, tingling, aching, and tearing  In the last 24 hours, has pain interfered with the following? General activity 9 Relation with others 9 Enjoyment of life 9 What TIME of day is your pain at its worst? varies Sleep (in general) Poor  Pain is worse with: walking, bending, sitting, standing, and some activites Pain improves with: rest and medication Relief from Meds: 5  Family History  Problem Relation Age of Onset   Hyperlipidemia Father    Hypertension Father    Arthritis Father    Heart disease Father    Asthma Mother    Diabetes Mother    Arthritis Mother    Heart disease Mother    Heart disease Other    Lung disease Other    Diabetes Other    Hypertension Other    Social History   Socioeconomic History   Marital status: Significant Other    Spouse name: Not on file   Number of children: Not on file   Years of education: Not on file   Highest education level: Not on file  Occupational History   Not on file  Tobacco Use   Smoking status: Never   Smokeless tobacco: Never  Vaping Use   Vaping Use: Never used  Substance and Sexual Activity   Alcohol use: Not Currently   Drug use: Never   Sexual activity: Not Currently  Other Topics Concern   Not on file  Social History  Narrative   Not on file   Social Determinants of Health   Financial Resource Strain: Not on file  Food Insecurity: Not on file  Transportation Needs: Not on file  Physical Activity: Not on file  Stress: Not on file  Social Connections: Not on file   Past Surgical History:  Procedure Laterality Date   KNEE ARTHROSCOPY     LAPOROSCOPIC SURGERY     UTEREINE   ULNAR COLLATERAL LIGAMENT RECONSTRUCTION     Past Surgical History:  Procedure Laterality Date   KNEE ARTHROSCOPY     LAPOROSCOPIC SURGERY     UTEREINE   ULNAR COLLATERAL LIGAMENT RECONSTRUCTION     Past Medical History:  Diagnosis Date   Back pain    Cervicalgia    Chronic headaches    Chronic pain syndrome    Endometriosis    Facet syndrome, lumbar    Hyperlipidemia    Hypertension    Lumbar sprain    Migraines    Myofascial pain    Neck pain    TMJ syndrome    Ht '5\' 9"'$  (1.753 m)   Wt (!) 373 lb (169.2 kg)   BMI 55.08 kg/m   Opioid Risk Score:   Fall Risk Score:  `1  Depression screen PHQ  2/9     08/08/2021    8:35 AM 04/26/2021    9:54 AM 04/19/2021    8:38 AM 02/25/2021    9:24 AM 12/31/2020    9:42 AM 11/11/2020   10:23 AM 08/04/2020    9:12 AM  Depression screen PHQ 2/9  Decreased Interest 0 0 0 0 0 0 0  Down, Depressed, Hopeless 0 1 0 0 0 0 0  PHQ - 2 Score 0 1 0 0 0 0 0    Review of Systems  Musculoskeletal:  Positive for back pain and gait problem.       Pain : Right buttock, right hip down to right foot   All other systems reviewed and are negative.      Objective:   Physical Exam Vitals and nursing note reviewed.  Constitutional:      Appearance: Normal appearance.  Cardiovascular:     Rate and Rhythm: Normal rate and regular rhythm.     Pulses: Normal pulses.     Heart sounds: Normal heart sounds.  Pulmonary:     Effort: Pulmonary effort is normal.     Breath sounds: Normal breath sounds.  Musculoskeletal:     Cervical back: Normal range of motion and neck supple.      Comments: Normal Muscle Bulk and Muscle Testing Reveals:  Upper Extremities: Full ROM and Muscle Strength 5/5 Bilateral AC Joint Tenderness  Lumbar Hypersensitivity  Lower Extremities: Decreased ROM and Muscle Strength 5/5 Bilateral Lower Extremity Flexion Produces Pain into her Bilateral Patella's Arises from Table slowly Antalgic  Gait     Skin:    General: Skin is warm and dry.  Neurological:     Mental Status: She is alert and oriented to person, place, and time.  Psychiatric:        Mood and Affect: Mood normal.        Behavior: Behavior normal.         Assessment & Plan:  Acute Exacerbation of Chronic Low Back Pain ; S/P Fall month ago: Ortho Following. She is awaiting Physical Therapy and MRI date she reports.  2.  History of chronic migraines with aura and cervicalgia with myofascial pain.Refilled: Dilaudid '4mg'$  1- tablet 4 hour as needed for severe pain. #12. Second script sent for the following month . 12/07/2021 3. 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 S/P Lumbar ESI with Dr Letta Pate on 11/11/2020, with good relief noted. Continue with Exercise and use Heat therapy. 12/07/2021 Refilled: Increased: Oxycodone 5 mg one tablet every 6 hours as needed # 120.We will continue the opioid monitoring program, this consists of regular clinic visits, examinations, urine drug screen, pill counts as well as use of [place] Controlled Substance Reporting system. A 12 month History has been reviewed on the [place] Today.   12/07/2021 4.  Primary Bilateral Osteoarthritis of the left knee greater than right with meniscal. Chondromalacia patellae changes.Continue to monitor. Continue HEP as Tolerated. Continue to Monitor. 12/07/2021. 5. Left elbow pain:.No complaints today. Ortho Following. S/P surgery on  01/27/2019. Continue to monitor. 12/07/2021. 6. Chronic Left Shoulder pain: No complaints today. Continue HEP as  Tolerated. Continue to Monitor.12/07/2021 7. Sacral Pain: No complaints today. Continue HEP as Tolerated. Continue to Monitor. Continue current medication regimen. 11/ 15/2023. 8. Cervicalgia:  No complaints today. Continue HEP as tolerated. Continue to monitor. 12/07/2021 9. Fall subsequent Encounter: No Falls since last visit. Ortho following   F/U in 2  months

## 2021-12-10 LAB — DRUG TOX MONITOR 1 W/CONF, ORAL FLD
Amphetamines: NEGATIVE ng/mL (ref <10)
Barbiturates: NEGATIVE ng/mL (ref <10)
Benzodiazepines: NEGATIVE ng/mL (ref <0.50)
Buprenorphine: NEGATIVE ng/mL (ref <0.10)
Cocaine: NEGATIVE ng/mL (ref <5.0)
Codeine: NEGATIVE ng/mL (ref <2.5)
Dihydrocodeine: NEGATIVE ng/mL (ref <2.5)
Fentanyl: NEGATIVE ng/mL (ref <0.10)
Heroin Metabolite: NEGATIVE ng/mL (ref <1.0)
Hydrocodone: NEGATIVE ng/mL (ref <2.5)
Hydromorphone: NEGATIVE ng/mL (ref <2.5)
MARIJUANA: NEGATIVE ng/mL (ref <2.5)
MDMA: NEGATIVE ng/mL (ref <10)
Meprobamate: NEGATIVE ng/mL (ref <2.5)
Methadone: NEGATIVE ng/mL (ref <5.0)
Morphine: NEGATIVE ng/mL (ref <2.5)
Nicotine Metabolite: NEGATIVE ng/mL (ref <5.0)
Norhydrocodone: NEGATIVE ng/mL (ref <2.5)
Noroxycodone: 25.3 ng/mL — ABNORMAL HIGH (ref <2.5)
Opiates: POSITIVE ng/mL — AB (ref <2.5)
Oxycodone: >250 ng/mL — ABNORMAL HIGH (ref <2.5)
Oxymorphone: 3.7 ng/mL — ABNORMAL HIGH (ref <2.5)
Phencyclidine: NEGATIVE ng/mL (ref <10)
Tapentadol: NEGATIVE ng/mL (ref <5.0)
Tramadol: NEGATIVE ng/mL (ref <5.0)
Zolpidem: NEGATIVE ng/mL (ref <5.0)

## 2021-12-10 LAB — DRUG TOX ALC METAB W/CON, ORAL FLD: Alcohol Metabolite: NEGATIVE ng/mL (ref <25)

## 2021-12-26 DIAGNOSIS — M7918 Myalgia, other site: Secondary | ICD-10-CM

## 2021-12-26 DIAGNOSIS — G894 Chronic pain syndrome: Secondary | ICD-10-CM

## 2021-12-26 DIAGNOSIS — Z5181 Encounter for therapeutic drug level monitoring: Secondary | ICD-10-CM

## 2021-12-26 DIAGNOSIS — M170 Bilateral primary osteoarthritis of knee: Secondary | ICD-10-CM

## 2021-12-26 DIAGNOSIS — Z79891 Long term (current) use of opiate analgesic: Secondary | ICD-10-CM

## 2021-12-26 MED ORDER — OXYCODONE HCL 5 MG PO TABS: 5.0000 mg | ORAL_TABLET | Freq: Four times a day (QID) | ORAL | 0 refills | Status: DC | PRN

## 2021-12-26 NOTE — Telephone Encounter (Signed)
I'm so sorry to hear of Shannon Meadows's misfortunes. If there is anything we can do to help, please let me know. ZTS    Med refill complete.

## 2022-02-01 ENCOUNTER — Encounter: Payer: Medicaid Other | Attending: Registered Nurse | Admitting: Registered Nurse

## 2022-02-01 ENCOUNTER — Telehealth: Payer: Self-pay | Admitting: Registered Nurse

## 2022-02-01 ENCOUNTER — Encounter: Payer: Self-pay | Admitting: Registered Nurse

## 2022-02-01 ENCOUNTER — Encounter: Payer: Medicaid Other | Admitting: Registered Nurse

## 2022-02-01 VITALS — BP 124/84 | HR 143 | Temp 98.0°F | Ht 69.0 in

## 2022-02-01 DIAGNOSIS — Z79891 Long term (current) use of opiate analgesic: Secondary | ICD-10-CM | POA: Diagnosis present

## 2022-02-01 DIAGNOSIS — G894 Chronic pain syndrome: Secondary | ICD-10-CM

## 2022-02-01 DIAGNOSIS — M170 Bilateral primary osteoarthritis of knee: Secondary | ICD-10-CM | POA: Diagnosis not present

## 2022-02-01 DIAGNOSIS — M5416 Radiculopathy, lumbar region: Secondary | ICD-10-CM | POA: Diagnosis not present

## 2022-02-01 DIAGNOSIS — Z5181 Encounter for therapeutic drug level monitoring: Secondary | ICD-10-CM | POA: Diagnosis not present

## 2022-02-01 DIAGNOSIS — M7918 Myalgia, other site: Secondary | ICD-10-CM | POA: Insufficient documentation

## 2022-02-01 MED ORDER — OXYCODONE HCL 5 MG PO TABS: 5.0000 mg | ORAL_TABLET | Freq: Four times a day (QID) | ORAL | 0 refills | Status: DC | PRN

## 2022-02-01 MED ORDER — HYDROMORPHONE HCL 4 MG PO TABS: 4.0000 mg | ORAL_TABLET | ORAL | 0 refills | Status: DC | PRN

## 2022-02-01 MED ORDER — ETODOLAC 400 MG PO TABS: ORAL_TABLET | ORAL | 2 refills | Status: DC

## 2022-02-01 NOTE — Telephone Encounter (Signed)
Etodolac ordered today.

## 2022-02-01 NOTE — Progress Notes (Signed)
Subjective:    Patient ID: Shannon Meadows, female    DOB: 03-10-1970, 52 y.o.   MRN: [id number]  HPI: Shannon Meadows is a 52 y.o. female who returns for follow up appointment for chronic pain and medication refill. She states her pain is located in her lower back pain radiating into her right hip and right lower extremity. Also reports bilateral knee pain. She rates her pain 9. Her current exercise regime is walking in her home with walker short distances.  [name] Morphine equivalent is 118.50 MME.   Last UDS was Performed on 12/07/2021, it was consistent.    Pain Inventory Average Pain 8 Pain Right Now 9 My pain is constant, sharp, burning, dull, stabbing, tingling, and aching  In the last 24 hours, has pain interfered with the following? General activity 10 Relation with others 8 Enjoyment of life 9 What TIME of day is your pain at its worst? morning , daytime, evening, night, and varies Sleep (in general) Poor  Pain is worse with: walking, bending, sitting, inactivity, standing, and some activites Pain improves with: rest and medication Relief from Meds: 5  Family History  Problem Relation Age of Onset   Hyperlipidemia Father    Hypertension Father    Arthritis Father    Heart disease Father    Asthma Mother    Diabetes Mother    Arthritis Mother    Heart disease Mother    Heart disease Other    Lung disease Other    Diabetes Other    Hypertension Other    Social History   Socioeconomic History   Marital status: Significant Other    Spouse name: Not on file   Number of children: Not on file   Years of education: Not on file   Highest education level: Not on file  Occupational History   Not on file  Tobacco Use   Smoking status: Never   Smokeless tobacco: Never  Vaping Use   Vaping Use: Never used  Substance and Sexual Activity   Alcohol use: Not Currently   Drug use: Never   Sexual activity: Not Currently  Other Topics Concern   Not on file  Social  History Narrative   Not on file   Social Determinants of Health   Financial Resource Strain: Not on file  Food Insecurity: Not on file  Transportation Needs: Not on file  Physical Activity: Not on file  Stress: Not on file  Social Connections: Not on file   Past Surgical History:  Procedure Laterality Date   KNEE ARTHROSCOPY     LAPOROSCOPIC SURGERY     UTEREINE   ULNAR COLLATERAL LIGAMENT RECONSTRUCTION     Past Surgical History:  Procedure Laterality Date   KNEE ARTHROSCOPY     LAPOROSCOPIC SURGERY     UTEREINE   ULNAR COLLATERAL LIGAMENT RECONSTRUCTION     Past Medical History:  Diagnosis Date   Back pain    Cervicalgia    Chronic headaches    Chronic pain syndrome    Endometriosis    Facet syndrome, lumbar    Hyperlipidemia    Hypertension    Lumbar sprain    Migraines    Myofascial pain    Neck pain    TMJ syndrome    Ht '5\' 9"'$  (1.753 m)   BMI 55.08 kg/m   Opioid Risk Score:   Fall Risk Score:  `1  Depression screen [facility] 2/9     02/01/2022  9:21 AM 12/07/2021    8:26 AM 08/08/2021    8:35 AM 04/26/2021    9:54 AM 04/19/2021    8:38 AM 02/25/2021    9:24 AM 12/31/2020    9:42 AM  Depression screen PHQ 2/9  Decreased Interest 0 0 0 0 0 0 0  Down, Depressed, Hopeless 0 0 0 1 0 0 0  PHQ - 2 Score 0 0 0 1 0 0 0    Review of Systems  Musculoskeletal:  Positive for back pain.       RIGHT HIP  Neurological:  Positive for headaches.  All other systems reviewed and are negative.      Objective:   Physical Exam Vitals and nursing note reviewed.  Constitutional:      Appearance: Normal appearance.  Cardiovascular:     Rate and Rhythm: Normal rate and regular rhythm.     Pulses: Normal pulses.     Heart sounds: Normal heart sounds.  Pulmonary:     Effort: Pulmonary effort is normal.     Breath sounds: Normal breath sounds.  Musculoskeletal:     Cervical back: Normal range of motion and neck supple.     Comments: Normal Muscle Bulk and Muscle  Testing Reveals:  Upper Extremities: Full ROM and Muscle Strength 5/5  Lumbar Hypersensitivity Right Greater Trochanter Tenderness Lower Extremities: Right: Full ROM and Muscle Strength 5/5 Right Lower Extremity Flexion Produces Pain into her Right Hip Left Lower Extremity: Decreased ROM and Muscle Strength 5/5 Left Lower Extremity Flexion Produces Pain into her Left Patella Arrived in wheelchair     Skin:    General: Skin is warm and dry.  Neurological:     Mental Status: She is alert and oriented to person, place, and time.  Psychiatric:        Mood and Affect: Mood normal.        Behavior: Behavior normal.         Assessment & Plan:  1.  History of chronic migraines with aura and cervicalgia with myofascial pain.Refilled: Dilaudid '4mg'$  1- tablet 4 hour as needed for severe pain. #12.    Continue  Aimovig. 02/01/2022 2. 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 S/P Lumbar ESI with Dr Letta Pate on 11/11/2020, with good relief noted. Continue with HEP as tolerated. and Heat therapy. 02/01/2022 Refilled: Oxycodone 5 mg one tablet every 6 hours as needed #120.We will continue the opioid monitoring program, this consists of regular clinic visits, examinations, urine drug screen, pill counts as well as use of [place] Controlled Substance Reporting system. A 12 month History has been reviewed on the [place] Today.   02/01/2022 3.  Primary Bilateral Osteoarthritis of the left knee greater than right with meniscal. Chondromalacia patellae changes.Continue to monitor. Continue HEP as Tolerated. Continue to Monitor. 02/01/2022. 4. Left elbow pain:.No complaints today. Ortho Following. S/P surgery on  01/27/2019. Continue to monitor. 02/01/2022. 5. Chronic Left Shoulder pain: No complaints today. Continue HEP as Tolerated. Continue to Monitor.02/01/2022 6. Sacral Pain: No complaints today. Continue HEP as Tolerated. Continue to  Monitor. Continue current medication regimen. 02/01/2022 7. Cervicalgia:  No complaints today. Continue HEP as tolerated. Continue to monitor. 02/01/2022 8. Chronic Pain syndrome: Continue etodolac. In past she was prescribed Diclofenac, Voltaren gel and Ibuprofen, all ineffective. She was prescribed etodolac and it has been effective. PA will be submitted today.   F/U in 2 months

## 2022-02-21 ENCOUNTER — Telehealth: Payer: Self-pay | Admitting: Registered Nurse

## 2022-02-21 DIAGNOSIS — Z79891 Long term (current) use of opiate analgesic: Secondary | ICD-10-CM

## 2022-02-21 DIAGNOSIS — Z5181 Encounter for therapeutic drug level monitoring: Secondary | ICD-10-CM

## 2022-02-21 DIAGNOSIS — M170 Bilateral primary osteoarthritis of knee: Secondary | ICD-10-CM

## 2022-02-21 DIAGNOSIS — M7918 Myalgia, other site: Secondary | ICD-10-CM

## 2022-02-21 DIAGNOSIS — G894 Chronic pain syndrome: Secondary | ICD-10-CM

## 2022-02-21 MED ORDER — OXYCODONE HCL 5 MG PO TABS: 5.0000 mg | ORAL_TABLET | Freq: Four times a day (QID) | ORAL | 0 refills | Status: DC | PRN

## 2022-02-21 MED ORDER — HYDROMORPHONE HCL 4 MG PO TABS: 4.0000 mg | ORAL_TABLET | ORAL | 0 refills | Status: DC | PRN

## 2022-02-21 NOTE — Telephone Encounter (Signed)
PMP was Reviewed.  Prescriptions sent to pharmacy.  Ms. Falter is aware via My-Chart

## 2022-03-01 ENCOUNTER — Telehealth: Payer: Self-pay | Admitting: Physical Medicine & Rehabilitation

## 2022-03-01 NOTE — Telephone Encounter (Signed)
Needs refill on oxycodone, per pharmacy her script requires prior authorization. Please call patient

## 2022-03-06 ENCOUNTER — Telehealth: Payer: Self-pay

## 2022-03-06 ENCOUNTER — Telehealth: Payer: Self-pay | Admitting: Registered Nurse

## 2022-03-06 NOTE — Telephone Encounter (Signed)
Per pharmacy hydromorphone was not authorized please submit. Pharmacy faxed request to our pharmacy   Also patient is still awaiting prior auth for Etodolac since December.   Please call patient.

## 2022-03-06 NOTE — Telephone Encounter (Signed)
A second PA has been created over the phone. Patient MME is more than the plan limit. REF [id number].

## 2022-03-07 NOTE — Telephone Encounter (Signed)
Rx approved. Pharmacy informed.

## 2022-03-22 ENCOUNTER — Telehealth: Payer: Self-pay | Admitting: *Deleted

## 2022-03-22 NOTE — Telephone Encounter (Signed)
Prior Josem Kaufmann was sent to [facility] [place] [employer] via CoverMyMeds. PA Case: [id number], Status: Approved, Coverage Starts on: 03/01/2022 12:00:00 AM, Coverage Ends on: 08/28/2022 12:00:00 AM.

## 2022-03-22 NOTE — Telephone Encounter (Signed)
Approvedtoday PA Case: [id number], Status: Approved, Coverage Starts on: 03/22/2022 12:00:00 AM, Coverage Ends on: 03/22/2023 12:00:00 AM.

## 2022-03-22 NOTE — Telephone Encounter (Signed)
Prior auth for [employer] submitted to [employer] via CoverMyMeds.

## 2022-03-29 ENCOUNTER — Encounter: Payer: Medicaid Other | Attending: Registered Nurse | Admitting: Registered Nurse

## 2022-03-29 VITALS — BP 118/78 | HR 96 | Ht 69.0 in | Wt 372.0 lb

## 2022-03-29 DIAGNOSIS — Z5181 Encounter for therapeutic drug level monitoring: Secondary | ICD-10-CM | POA: Diagnosis not present

## 2022-03-29 DIAGNOSIS — M7918 Myalgia, other site: Secondary | ICD-10-CM | POA: Insufficient documentation

## 2022-03-29 DIAGNOSIS — Z79891 Long term (current) use of opiate analgesic: Secondary | ICD-10-CM | POA: Diagnosis not present

## 2022-03-29 DIAGNOSIS — G894 Chronic pain syndrome: Secondary | ICD-10-CM | POA: Insufficient documentation

## 2022-03-29 DIAGNOSIS — M170 Bilateral primary osteoarthritis of knee: Secondary | ICD-10-CM | POA: Diagnosis present

## 2022-03-29 DIAGNOSIS — G43109 Migraine with aura, not intractable, without status migrainosus: Secondary | ICD-10-CM | POA: Insufficient documentation

## 2022-03-29 DIAGNOSIS — M797 Fibromyalgia: Secondary | ICD-10-CM | POA: Diagnosis present

## 2022-03-29 DIAGNOSIS — M47816 Spondylosis without myelopathy or radiculopathy, lumbar region: Secondary | ICD-10-CM | POA: Diagnosis present

## 2022-03-29 MED ORDER — OXYCODONE HCL 5 MG PO TABS: 5.0000 mg | ORAL_TABLET | Freq: Four times a day (QID) | ORAL | 0 refills | Status: DC | PRN

## 2022-03-29 MED ORDER — HYDROMORPHONE HCL 4 MG PO TABS: 4.0000 mg | ORAL_TABLET | ORAL | 0 refills | Status: DC | PRN

## 2022-03-29 NOTE — Patient Instructions (Signed)
Keep a Pain Journal for two weeks and send a My- Chart message in two weeks

## 2022-03-29 NOTE — Progress Notes (Signed)
Subjective:    Patient ID: Shannon Meadows, female    DOB: [birth year], 52 y.o.   MRN: [id number]  HPI: Shannon Meadows is a 52 y.o. female who returns for follow up appointment for chronic pain and medication refill. She states her pain is located in her lower back and bilateral knee pain. She also reports she has a migraine today, onset was yesterday.. She also reports she is receiving 41/2- hours of relief of her pain when she takes her Oxycodone, she was instructed to keep a pain journal, she verbalizes understanding. She rates her pain 7. Her current exercise regime is walking and performing stretching exercises.  Shannon Meadows Morphine equivalent is 118.50  MME.   UDS ordered today.     Pain Inventory Average Pain 7 Pain Right Now 7 My pain is intermittent, constant, sharp, burning, dull, stabbing, tingling, and aching  In the last 24 hours, has pain interfered with the following? General activity 8 Relation with others 8 Enjoyment of life 8 What TIME of day is your pain at its worst? varies Sleep (in general) Fair  Pain is worse with: bending and some activites Pain improves with: rest, heat/ice, pacing activities, medication, and injections Relief from Meds: 4  Family History  Problem Relation Age of Onset   Hyperlipidemia Father    Hypertension Father    Arthritis Father    Heart disease Father    Asthma Mother    Diabetes Mother    Arthritis Mother    Heart disease Mother    Heart disease Other    Lung disease Other    Diabetes Other    Hypertension Other    Social History   Socioeconomic History   Marital status: Significant Other    Spouse name: Not on file   Number of children: Not on file   Years of education: Not on file   Highest education level: Not on file  Occupational History   Not on file  Tobacco Use   Smoking status: Never   Smokeless tobacco: Never  Vaping Use   Vaping Use: Never used  Substance and Sexual Activity   Alcohol use: Not Currently    Drug use: Never   Sexual activity: Not Currently  Other Topics Concern   Not on file  Social History Narrative   Not on file   Social Determinants of Health   Financial Resource Strain: Not on file  Food Insecurity: Not on file  Transportation Needs: Not on file  Physical Activity: Not on file  Stress: Not on file  Social Connections: Not on file   Past Surgical History:  Procedure Laterality Date   KNEE ARTHROSCOPY     [place]     Past Surgical History:  Procedure Laterality Date   KNEE ARTHROSCOPY     LAPOROSCOPIC SURGERY     UTEREINE   ULNAR COLLATERAL LIGAMENT RECONSTRUCTION     Past Medical History:  Diagnosis Date   Back pain    Cervicalgia    Chronic headaches    Chronic pain syndrome    Endometriosis    Facet syndrome, lumbar    Hyperlipidemia    Hypertension    Lumbar sprain    Migraines    Myofascial pain    Neck pain    TMJ syndrome    BP 118/78   Pulse 96   Ht '5\' 9"'$  (1.753 m)   Wt (!) 372  lb (168.7 kg)   SpO2 92%   BMI 54.93 kg/m   Opioid Risk Score:   Fall Risk Score:  `1  Depression screen [facility] 2/9     03/29/2022    8:11 AM 02/01/2022    9:21 AM 12/07/2021    8:26 AM 08/08/2021    8:35 AM 04/26/2021    9:54 AM 04/19/2021    8:38 AM 02/25/2021    9:24 AM  Depression screen PHQ 2/9  Decreased Interest 0 0 0 0 0 0 0  Down, Depressed, Hopeless 0 0 0 0 1 0 0  PHQ - 2 Score 0 0 0 0 1 0 0    Review of Systems  Constitutional: Negative.   HENT: Negative.    Eyes: Negative.   Respiratory: Negative.    Cardiovascular: Negative.   Gastrointestinal: Negative.   Endocrine: Negative.   Genitourinary: Negative.   Musculoskeletal:  Positive for arthralgias and back pain.  Skin: Negative.   Allergic/Immunologic: Negative.   Neurological:  Positive for headaches.  Hematological: Negative.   Psychiatric/Behavioral: Negative.    All other systems reviewed and are  negative.      Objective:   Physical Exam Vitals and nursing note reviewed.  Constitutional:      Appearance: Normal appearance.  Neck:     Comments: Cervical Paraspinal Tenderness: C-5-C-6 Cardiovascular:     Rate and Rhythm: Normal rate and regular rhythm.     Pulses: Normal pulses.     Heart sounds: Normal heart sounds.  Pulmonary:     Effort: Pulmonary effort is normal.     Breath sounds: Normal breath sounds.  Musculoskeletal:     Cervical back: Normal range of motion and neck supple.     Comments: Normal Muscle Bulk and Muscle Testing Reveals:  Upper Extremities: Full ROM and Muscle Strength 5/5 Bilateral AC Joint Tenderness  Thoracic Paraspinal Tenderness: T-7-T-9 Lumbar Paraspinal Tenderness: L-3-L-5 Lower Extremities: Right Lower Extremity: Full ROM and Muscle Strength 5/5 Left Lower Extremity: Decreased ROM and Muscle Strength 5/5 Arises from Table slowly Antalgic  Gait     Skin:    General: Skin is warm and dry.  Neurological:     Mental Status: She is alert and oriented to person, place, and time.  Psychiatric:        Mood and Affect: Mood normal.        Behavior: Behavior normal.         Assessment & Plan:  1.  History of chronic migraines with aura and cervicalgia with myofascial pain.Refilled: Dilaudid '4mg'$  1- tablet 4 hour as needed for severe pain. #12.    Continue  Aimovig. 03/29/2022 2. 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 S/P Lumbar ESI with Dr Letta Pate on 11/11/2020, with good relief noted. Continue with HEP as tolerated. and Heat therapy. 03/29/2022 Refilled: Oxycodone 5 mg one tablet every 6 hours as needed #120.We will continue the opioid monitoring program, this consists of regular clinic visits, examinations, urine drug screen, pill counts as well as use of [place] Controlled Substance Reporting system. A 12 month History has been reviewed on the [place] Today.    03/29/2022 3.  Primary Bilateral Osteoarthritis of the left knee greater than right with meniscal. Chondromalacia patellae changes.Continue to monitor. Continue HEP as Tolerated. Continue to Monitor. 03/29/2022. 4. Left elbow pain:.No complaints today. Ortho Following. S/P surgery on  01/27/2019. Continue to monitor. 03/29/2022. 5. Chronic Left Shoulder pain: No complaints  today. Continue HEP as Tolerated. Continue to Monitor.03/29/2022 6. Sacral Pain: No complaints today. Continue HEP as Tolerated. Continue to Monitor. Continue current medication regimen. 03/29/2022 7. Cervicalgia:  No complaints today. Continue HEP as tolerated. Continue to monitor. 03/29/2022 8. Chronic Pain syndrome: Continue etodolac. In past she was prescribed Diclofenac, Voltaren gel and Ibuprofen, all ineffective. She was prescribed etodolac and it has been effective. PA will be submitted today.    F/U in 2 months

## 2022-04-01 LAB — TOXASSURE SELECT,+ANTIDEPR,UR

## 2022-04-04 ENCOUNTER — Encounter: Payer: Self-pay | Admitting: Registered Nurse

## 2022-04-12 ENCOUNTER — Telehealth: Payer: Self-pay | Admitting: *Deleted

## 2022-04-12 NOTE — Telephone Encounter (Signed)
Urine drug screen for this encounter is consistent for prescribed medication 

## 2022-04-14 ENCOUNTER — Telehealth: Payer: Self-pay | Admitting: Registered Nurse

## 2022-04-14 MED ORDER — OXYCODONE-ACETAMINOPHEN 7.5-325 MG PO TABS: 1.0000 | ORAL_TABLET | Freq: Four times a day (QID) | ORAL | 0 refills | Status: DC | PRN

## 2022-04-14 NOTE — Telephone Encounter (Signed)
PMP was Reviewed.  Oxycodone e-scribed for April a PA will be submitted next week. Ms. Kea is aware via My-Chart message.

## 2022-04-14 NOTE — Telephone Encounter (Signed)
PA in covermymeds

## 2022-04-17 ENCOUNTER — Telehealth: Payer: Self-pay | Admitting: *Deleted

## 2022-04-17 NOTE — Telephone Encounter (Signed)
Shannon Meadows ([employer]) - [id number] oxyCODONE-Acetaminophen 7.5-325MG  tablets Status: PA RequestCreated: March 22nd, 2024 [id number]: March 25th, 2024

## 2022-04-19 ENCOUNTER — Telehealth: Payer: Self-pay | Admitting: *Deleted

## 2022-04-19 NOTE — Telephone Encounter (Signed)
Outcome Approved on March 25 PA Case: [id number], Status: Approved, Coverage Starts on: 04/17/2022 12:00:00 AM, Coverage Ends on: 10/14/2022 12:00:00 AM. Authorization Expiration Date: 10/13/2022

## 2022-04-19 NOTE — Telephone Encounter (Signed)
PA Oxycodone apap 7.5-325 approved 04/17/22--10/14/22. [id number]

## 2022-04-27 ENCOUNTER — Telehealth: Payer: Self-pay | Admitting: Registered Nurse

## 2022-04-27 NOTE — Telephone Encounter (Signed)
Shannon Meadows,  I change Ms. Harold Oxycodone to 7.5 mg,  Can you see what the problem is. I think it maybe a PA issue.  Thanks

## 2022-04-27 NOTE — Telephone Encounter (Signed)
Spoke w/ pharmacy they advised they are getting medication ready for her to pick up now

## 2022-04-28 ENCOUNTER — Telehealth: Payer: Self-pay | Admitting: Registered Nurse

## 2022-05-05 ENCOUNTER — Other Ambulatory Visit: Payer: Self-pay | Admitting: Physical Medicine and Rehabilitation

## 2022-05-05 DIAGNOSIS — Z79891 Long term (current) use of opiate analgesic: Secondary | ICD-10-CM

## 2022-05-05 DIAGNOSIS — M170 Bilateral primary osteoarthritis of knee: Secondary | ICD-10-CM

## 2022-05-05 DIAGNOSIS — M7918 Myalgia, other site: Secondary | ICD-10-CM

## 2022-05-05 DIAGNOSIS — Z5181 Encounter for therapeutic drug level monitoring: Secondary | ICD-10-CM

## 2022-05-05 DIAGNOSIS — G894 Chronic pain syndrome: Secondary | ICD-10-CM

## 2022-05-05 MED ORDER — HYDROMORPHONE HCL 4 MG PO TABS: 4.0000 mg | ORAL_TABLET | ORAL | 0 refills | Status: DC | PRN

## 2022-05-24 ENCOUNTER — Telehealth: Payer: Self-pay | Admitting: Registered Nurse

## 2022-05-24 MED ORDER — OXYCODONE-ACETAMINOPHEN 7.5-325 MG PO TABS: 1.0000 | ORAL_TABLET | Freq: Four times a day (QID) | ORAL | 0 refills | Status: DC | PRN

## 2022-05-24 NOTE — Telephone Encounter (Signed)
PMP was Reviewed.  Oxycodone e-scribed today.  My- Chart message will be sent to Ms. Kneece regarding the above.

## 2022-05-25 NOTE — Progress Notes (Deleted)
Subjective:    Patient ID: Shannon Meadows, female    DOB: Mar 12, 1970, 52 y.o.   MRN: [id number]  HPI   Pain Inventory Average Pain {NUMBERS; 0-10:5044} Pain Right Now {NUMBERS; 0-10:5044} My pain is {PAIN DESCRIPTION:21022940}  In the last 24 hours, has pain interfered with the following? General activity {NUMBERS; 0-10:5044} Relation with others {NUMBERS; 0-10:5044} Enjoyment of life {NUMBERS; 0-10:5044} What TIME of day is your pain at its worst? {time of day:24191} Sleep (in general) {BHH GOOD/FAIR/POOR:22877}  Pain is worse with: {ACTIVITIES:21022942} Pain improves with: {PAIN IMPROVES NWGN:56213086} Relief from Meds: {NUMBERS; 0-10:5044}  Family History  Problem Relation Age of Onset   Hyperlipidemia Father    Hypertension Father    Arthritis Father    Heart disease Father    Asthma Mother    Diabetes Mother    Arthritis Mother    Heart disease Mother    Heart disease Other    Lung disease Other    Diabetes Other    Hypertension Other    Social History   Socioeconomic History   Marital status: Significant Other    Spouse name: Not on file   Number of children: Not on file   Years of education: Not on file   Highest education level: Not on file  Occupational History   Not on file  Tobacco Use   Smoking status: Never   Smokeless tobacco: Never  Vaping Use   Vaping Use: Never used  Substance and Sexual Activity   Alcohol use: Not Currently   Drug use: Never   Sexual activity: Not Currently  Other Topics Concern   Not on file  Social History Narrative   Not on file   Social Determinants of Health   Financial Resource Strain: Not on file  Food Insecurity: Not on file  Transportation Needs: Not on file  Physical Activity: Not on file  Stress: Not on file  Social Connections: Not on file   Past Surgical History:  Procedure Laterality Date   KNEE ARTHROSCOPY     LAPOROSCOPIC SURGERY     UTEREINE   ULNAR COLLATERAL LIGAMENT RECONSTRUCTION      Past Surgical History:  Procedure Laterality Date   KNEE ARTHROSCOPY     LAPOROSCOPIC SURGERY     UTEREINE   ULNAR COLLATERAL LIGAMENT RECONSTRUCTION     Past Medical History:  Diagnosis Date   Back pain    Cervicalgia    Chronic headaches    Chronic pain syndrome    Endometriosis    Facet syndrome, lumbar    Hyperlipidemia    Hypertension    Lumbar sprain    Migraines    Myofascial pain    Neck pain    TMJ syndrome    There were no vitals taken for this visit.  Opioid Risk Score:   Fall Risk Score:  `1  Depression screen [facility] 2/9     03/29/2022    8:11 AM 02/01/2022    9:21 AM 12/07/2021    8:26 AM 08/08/2021    8:35 AM 04/26/2021    9:54 AM 04/19/2021    8:38 AM 02/25/2021    9:24 AM  Depression screen PHQ 2/9  Decreased Interest 0 0 0 0 0 0 0  Down, Depressed, Hopeless 0 0 0 0 1 0 0  PHQ - 2 Score 0 0 0 0 1 0 0    Review of Systems     Objective:   Physical Exam  Assessment & Plan:

## 2022-05-26 ENCOUNTER — Encounter: Payer: Self-pay | Admitting: Registered Nurse

## 2022-05-26 ENCOUNTER — Encounter: Payer: Medicaid Other | Attending: Registered Nurse | Admitting: Registered Nurse

## 2022-05-26 VITALS — BP 125/89 | HR 68 | Ht 69.0 in | Wt 389.0 lb

## 2022-05-26 DIAGNOSIS — M7918 Myalgia, other site: Secondary | ICD-10-CM | POA: Diagnosis present

## 2022-05-26 DIAGNOSIS — M47816 Spondylosis without myelopathy or radiculopathy, lumbar region: Secondary | ICD-10-CM | POA: Diagnosis present

## 2022-05-26 DIAGNOSIS — M170 Bilateral primary osteoarthritis of knee: Secondary | ICD-10-CM | POA: Insufficient documentation

## 2022-05-26 DIAGNOSIS — Z79891 Long term (current) use of opiate analgesic: Secondary | ICD-10-CM | POA: Insufficient documentation

## 2022-05-26 DIAGNOSIS — Z5181 Encounter for therapeutic drug level monitoring: Secondary | ICD-10-CM | POA: Diagnosis present

## 2022-05-26 DIAGNOSIS — M5416 Radiculopathy, lumbar region: Secondary | ICD-10-CM | POA: Insufficient documentation

## 2022-05-26 DIAGNOSIS — G894 Chronic pain syndrome: Secondary | ICD-10-CM | POA: Diagnosis present

## 2022-05-26 DIAGNOSIS — G43109 Migraine with aura, not intractable, without status migrainosus: Secondary | ICD-10-CM | POA: Insufficient documentation

## 2022-05-26 DIAGNOSIS — M797 Fibromyalgia: Secondary | ICD-10-CM | POA: Diagnosis present

## 2022-05-26 MED ORDER — HYDROMORPHONE HCL 4 MG PO TABS
4.0000 mg | ORAL_TABLET | ORAL | 0 refills | Status: DC | PRN
Start: 2022-05-26 — End: 2022-05-26

## 2022-05-26 MED ORDER — HYDROMORPHONE HCL 4 MG PO TABS
4.0000 mg | ORAL_TABLET | ORAL | 0 refills | Status: DC | PRN
Start: 2022-05-26 — End: 2022-06-22

## 2022-05-26 MED ORDER — AIMOVIG 70 MG/ML ~~LOC~~ SOAJ: 1.0000 mL | SUBCUTANEOUS | 3 refills | Status: DC

## 2022-05-26 MED ORDER — ERENUMAB-AOOE 70 MG/ML ~~LOC~~ SOAJ
70.0000 mg | Freq: Once | SUBCUTANEOUS | Status: DC
Start: 2022-05-26 — End: 2022-05-26

## 2022-05-26 MED ORDER — OXYCODONE-ACETAMINOPHEN 7.5-325 MG PO TABS: 1.0000 | ORAL_TABLET | Freq: Four times a day (QID) | ORAL | 0 refills | Status: DC | PRN

## 2022-05-26 NOTE — Progress Notes (Signed)
Subjective:    Patient ID: Shannon Meadows, female    DOB: 10-15-1970, 52 y.o.   MRN: [id number]  HPI: Shannon Meadows is a 52 y.o. female who returns for follow up appointment for chronic pain and medication refill. She states her pain is located in her lower back radiating into her right lower extremity and bilateral knee pain. She also reports increase intensity and frequency of Migraines, we will resume the Aimovig, she was prescribed Aimovig in the past with good relief noted. In the past she was prescribed Maxalt, Frovatriptan, Imitex and Ajovy, they were all ineffective. Prior Authorization will be submitted today. Shannon Meadows verbalizes understanding.   She rates her pain 8. current exercise regime is walking and performing stretching exercises.  Shannon Meadows Morphine equivalent is 118.50 MME.   Last UDS was Performed on 03/29/2022, it was consistent for oxycodone.    Shannon Meadows blood pressure was re-checked, when she stood up she reported her lower extremities felt numb and she denies any dizziness.  Blood Pressure was re-checked ,she will F/U with her PCP. She verbalizes understanding.   Pain Inventory Average Pain 8 Pain Right Now 8 My pain is intermittent, constant, sharp, burning, dull, stabbing, tingling, and aching  In the last 24 hours, has pain interfered with the following? General activity 7 Relation with others 7 Enjoyment of life 9 What TIME of day is your pain at its worst? daytime Sleep (in general) Fair  Pain is worse with: walking, bending, sitting, inactivity, standing, and some activites Pain improves with: rest, heat/ice, therapy/exercise, pacing activities, medication, TENS, and   Relief from Meds: 6  Family History  Problem Relation Age of Onset   Hyperlipidemia Father    Hypertension Father    Arthritis Father    Heart disease Father    Asthma Mother    Diabetes Mother    Arthritis Mother    Heart disease Mother    Heart disease Other    Lung disease Other     Diabetes Other    Hypertension Other    Social History   Socioeconomic History   Marital status: Significant Other    Spouse name: Not on file   Number of children: Not on file   Years of education: Not on file   Highest education level: Not on file  Occupational History   Not on file  Tobacco Use   Smoking status: Never   Smokeless tobacco: Never  Vaping Use   Vaping Use: Never used  Substance and Sexual Activity   Alcohol use: Not Currently   Drug use: Never   Sexual activity: Not Currently  Other Topics Concern   Not on file  Social History Narrative   Not on file   Social Determinants of Health   Financial Resource Strain: Not on file  Food Insecurity: Not on file  Transportation Needs: Not on file  Physical Activity: Not on file  Stress: Not on file  Social Connections: Not on file   Past Surgical History:  Procedure Laterality Date   KNEE ARTHROSCOPY     LAPOROSCOPIC SURGERY     UTEREINE   ULNAR COLLATERAL LIGAMENT RECONSTRUCTION     Past Surgical History:  Procedure Laterality Date   KNEE ARTHROSCOPY     LAPOROSCOPIC SURGERY     UTEREINE   ULNAR COLLATERAL LIGAMENT RECONSTRUCTION     Past Medical History:  Diagnosis Date   Back pain    Cervicalgia    Chronic headaches  Chronic pain syndrome    Endometriosis    Facet syndrome, lumbar    Hyperlipidemia    Hypertension    Lumbar sprain    Migraines    Myofascial pain    Neck pain    TMJ syndrome    BP 112/77   Pulse 68   Ht 5\' 9"  (1.753 m)   Wt (!) 389 lb (176.4 kg)   SpO2 93%   BMI 57.45 kg/m   Opioid Risk Score:   Fall Risk Score:  `1  Depression screen [facility] 2/9     05/26/2022    8:22 AM 03/29/2022    8:11 AM 02/01/2022    9:21 AM 12/07/2021    8:26 AM 08/08/2021    8:35 AM 04/26/2021    9:54 AM 04/19/2021    8:38 AM  Depression screen PHQ 2/9  Decreased Interest 1 0 0 0 0 0 0  Down, Depressed, Hopeless 1 0 0 0 0 1 0  PHQ - 2 Score 2 0 0 0 0 1 0    Review of Systems   Musculoskeletal:  Positive for arthralgias, back pain, gait problem and neck pain.       Pain in both knees  All other systems reviewed and are negative.     Objective:   Physical Exam Vitals and nursing note reviewed.  Constitutional:      Appearance: Normal appearance.  Cardiovascular:     Rate and Rhythm: Normal rate and regular rhythm.     Pulses: Normal pulses.     Heart sounds: Normal heart sounds.  Pulmonary:     Effort: Pulmonary effort is normal.     Breath sounds: Normal breath sounds.  Musculoskeletal:     Cervical back: Normal range of motion and neck supple.     Comments: Normal Muscle Bulk and Muscle Testing Reveals:  Upper Extremities: Full ROM and Muscle Strength 5/5  Lumbar Paraspinal Tenderness: L-4-L-5 Lower Extremities: Full ROM and Muscle Strength 5/5 Arises from Table Slowly Antalgic  Gait     Skin:    General: Skin is warm and dry.  Neurological:     Mental Status: She is alert and oriented to person, place, and time.  Psychiatric:        Mood and Affect: Mood normal.        Behavior: Behavior normal.         Assessment & Plan:  1.  History of chronic migraines with aura and cervicalgia with myofascial pain.Refilled: Dilaudid 4mg  1- tablet 4 hour as needed for severe pain. #12. RX Aimovig. 05/26/2022 2. 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 S/P Lumbar ESI with Dr Wynn Banker on 11/11/2020, with good relief noted. Continue with HEP as tolerated. and Heat therapy. 05/26/2022 Refilled: Oxycodone 7.5 mg one tablet every 6 hours as needed #120.We will continue the opioid monitoring program, this consists of regular clinic visits, examinations, urine drug screen, pill counts as well as use of [place] Controlled Substance Reporting system. A 12 month History has been reviewed on the [place] Controlled Substance Reporting System Today.   05/26/2022 3.  Primary Bilateral Osteoarthritis of the left knee greater than right with  meniscal. Chondromalacia patellae changes.Continue to monitor. Continue HEP as Tolerated. Continue to Monitor. 05/26/2022. 4. Left elbow pain:.No complaints today. Ortho Following. S/P surgery on  01/27/2019. Continue to monitor. 05/26/2022. 5. Chronic Left Shoulder pain: No complaints today. Continue HEP as Tolerated. Continue to Monitor.05/26/2022 6. Sacral Pain: No  complaints today. Continue HEP as Tolerated. Continue to Monitor. Continue current medication regimen. 05/26/2022 7. Cervicalgia:  No complaints today. Continue HEP as tolerated. Continue to monitor. 05/26/2022 8. Chronic Pain syndrome: Continue etodolac. In past she was prescribed Diclofenac, Voltaren gel and Ibuprofen, all ineffective. She was prescribed etodolac and it has been effective. 05/26/2022   F/U in 2 months

## 2022-05-29 ENCOUNTER — Telehealth: Payer: Self-pay | Admitting: *Deleted

## 2022-05-29 NOTE — Telephone Encounter (Signed)
Shannon Meadows (Key: [id number]) Rx #: [id number] Aimovig

## 2022-06-01 ENCOUNTER — Other Ambulatory Visit: Payer: Self-pay | Admitting: Registered Nurse

## 2022-06-01 NOTE — Telephone Encounter (Signed)
Approved 05/31/22-08/29/22

## 2022-06-22 ENCOUNTER — Telehealth: Payer: Self-pay | Admitting: Registered Nurse

## 2022-06-22 DIAGNOSIS — Z5181 Encounter for therapeutic drug level monitoring: Secondary | ICD-10-CM

## 2022-06-22 DIAGNOSIS — Z79891 Long term (current) use of opiate analgesic: Secondary | ICD-10-CM

## 2022-06-22 DIAGNOSIS — M170 Bilateral primary osteoarthritis of knee: Secondary | ICD-10-CM

## 2022-06-22 DIAGNOSIS — M7918 Myalgia, other site: Secondary | ICD-10-CM

## 2022-06-22 DIAGNOSIS — G894 Chronic pain syndrome: Secondary | ICD-10-CM

## 2022-06-22 MED ORDER — HYDROMORPHONE HCL 4 MG PO TABS
4.0000 mg | ORAL_TABLET | ORAL | 0 refills | Status: DC | PRN
Start: 2022-06-22 — End: 2022-07-21

## 2022-06-22 NOTE — Telephone Encounter (Signed)
PMP was Reviewed.  Hydromorphone e-scribed to pharmacy.  Shannon Meadows is aware via My-Chart

## 2022-06-26 ENCOUNTER — Other Ambulatory Visit: Payer: Self-pay | Admitting: *Deleted

## 2022-06-26 DIAGNOSIS — M797 Fibromyalgia: Secondary | ICD-10-CM

## 2022-06-26 DIAGNOSIS — G894 Chronic pain syndrome: Secondary | ICD-10-CM

## 2022-06-26 DIAGNOSIS — M7918 Myalgia, other site: Secondary | ICD-10-CM

## 2022-06-26 DIAGNOSIS — M170 Bilateral primary osteoarthritis of knee: Secondary | ICD-10-CM

## 2022-06-27 MED ORDER — TIZANIDINE HCL 4 MG PO TABS: 4.0000 mg | ORAL_TABLET | Freq: Three times a day (TID) | ORAL | 1 refills | Status: DC

## 2022-07-03 ENCOUNTER — Telehealth: Payer: Self-pay | Admitting: Registered Nurse

## 2022-07-03 MED ORDER — PREGABALIN 300 MG PO CAPS: 300.0000 mg | ORAL_CAPSULE | Freq: Two times a day (BID) | ORAL | 2 refills | Status: DC

## 2022-07-03 NOTE — Telephone Encounter (Signed)
PMP was Reviewed.  Lyrica e-scribed to pharmacy.  Ms. Kasparek is aware via My-Chart

## 2022-07-21 ENCOUNTER — Encounter: Payer: Self-pay | Admitting: Registered Nurse

## 2022-07-21 ENCOUNTER — Encounter: Payer: Medicaid Other | Attending: Registered Nurse | Admitting: Registered Nurse

## 2022-07-21 VITALS — BP 142/90 | HR 76 | Ht 69.0 in | Wt 373.0 lb

## 2022-07-21 DIAGNOSIS — G43109 Migraine with aura, not intractable, without status migrainosus: Secondary | ICD-10-CM | POA: Diagnosis present

## 2022-07-21 DIAGNOSIS — M797 Fibromyalgia: Secondary | ICD-10-CM | POA: Diagnosis present

## 2022-07-21 DIAGNOSIS — M7918 Myalgia, other site: Secondary | ICD-10-CM

## 2022-07-21 DIAGNOSIS — Z79891 Long term (current) use of opiate analgesic: Secondary | ICD-10-CM

## 2022-07-21 DIAGNOSIS — Z5181 Encounter for therapeutic drug level monitoring: Secondary | ICD-10-CM

## 2022-07-21 DIAGNOSIS — G894 Chronic pain syndrome: Secondary | ICD-10-CM | POA: Diagnosis present

## 2022-07-21 DIAGNOSIS — M47816 Spondylosis without myelopathy or radiculopathy, lumbar region: Secondary | ICD-10-CM | POA: Diagnosis present

## 2022-07-21 DIAGNOSIS — M170 Bilateral primary osteoarthritis of knee: Secondary | ICD-10-CM | POA: Diagnosis present

## 2022-07-21 MED ORDER — HYDROMORPHONE HCL 4 MG PO TABS
4.0000 mg | ORAL_TABLET | ORAL | 0 refills | Status: DC | PRN
Start: 2022-07-21 — End: 2022-09-19

## 2022-07-21 MED ORDER — OXYCODONE-ACETAMINOPHEN 7.5-325 MG PO TABS: 1.0000 | ORAL_TABLET | Freq: Four times a day (QID) | ORAL | 0 refills | Status: DC | PRN

## 2022-07-21 NOTE — Progress Notes (Signed)
Subjective:    Patient ID: Shannon Meadows, female    DOB: [birth year], 52 y.o.   MRN: [id number]  HPI: Shannon Meadows is a 52 y.o. female who returns for follow up appointment for chronic pain and medication refill. She states her pain is located in her lower back pain and bilateral feet with tingling. Also reports she has a slight migraine. She rates her pain 7. Her current exercise regime is walking and performing stretching exercises.  Ms. Krolik Morphine equivalent is 141.00 MME.   Last UDS was Performed on 03/29/2022, it was consistent fro Oxycodone.    Pain Inventory Average Pain 7 Pain Right Now 7 My pain is intermittent, constant, sharp, burning, dull, stabbing, tingling, and aching  In the last 24 hours, has pain interfered with the following? General activity 6 Relation with others 6 Enjoyment of life 7 What TIME of day is your pain at its worst? daytime Sleep (in general) Poor  Pain is worse with: walking, bending, sitting, standing, and some activites Pain improves with: rest, heat/ice, therapy/exercise, pacing activities, medication, TENS, and injections Relief from Meds: 5  Family History  Problem Relation Age of Onset   Hyperlipidemia Father    Hypertension Father    Arthritis Father    Heart disease Father    Asthma Mother    Diabetes Mother    Arthritis Mother    Heart disease Mother    Heart disease Other    Lung disease Other    Diabetes Other    Hypertension Other    Social History   Socioeconomic History   Marital status: Significant Other    Spouse name: Not on file   Number of children: Not on file   Years of education: Not on file   Highest education level: Not on file  Occupational History   Not on file  Tobacco Use   Smoking status: Never   Smokeless tobacco: Never  Vaping Use   Vaping Use: Never used  Substance and Sexual Activity   Alcohol use: Not Currently   Drug use: Never   Sexual activity: Not Currently  Other Topics Concern    Not on file  Social History Narrative   Not on file   Social Determinants of Health   Financial Resource Strain: Not on file  Food Insecurity: Not on file  Transportation Needs: Not on file  Physical Activity: Not on file  Stress: Not on file  Social Connections: Not on file   Past Surgical History:  Procedure Laterality Date   KNEE ARTHROSCOPY     LAPOROSCOPIC SURGERY     UTEREINE   ULNAR COLLATERAL LIGAMENT RECONSTRUCTION     Past Surgical History:  Procedure Laterality Date   KNEE ARTHROSCOPY     LAPOROSCOPIC SURGERY     UTEREINE   ULNAR COLLATERAL LIGAMENT RECONSTRUCTION     Past Medical History:  Diagnosis Date   Back pain    Cervicalgia    Chronic headaches    Chronic pain syndrome    Endometriosis    Facet syndrome, lumbar    Hyperlipidemia    Hypertension    Lumbar sprain    Migraines    Myofascial pain    Neck pain    TMJ syndrome    BP (!) 142/90   Pulse 76   Ht 5\' 9"  (1.753 m)   Wt (!) 373 lb (169.2 kg)   SpO2 94%   BMI 55.08 kg/m   Opioid Risk Score:  Fall Risk Score:  `1  Depression screen [facility] 2/9     05/26/2022    8:22 AM 03/29/2022    8:11 AM 02/01/2022    9:21 AM 12/07/2021    8:26 AM 08/08/2021    8:35 AM 04/26/2021    9:54 AM 04/19/2021    8:38 AM  Depression screen PHQ 2/9  Decreased Interest 1 0 0 0 0 0 0  Down, Depressed, Hopeless 1 0 0 0 0 1 0  PHQ - 2 Score 2 0 0 0 0 1 0      Review of Systems  Musculoskeletal:  Positive for back pain.       Bilateral knee pain Bilateral foot pain  All other systems reviewed and are negative.     Objective:   Physical Exam Vitals and nursing note reviewed.  Constitutional:      Appearance: Normal appearance.  Cardiovascular:     Rate and Rhythm: Normal rate and regular rhythm.     Pulses: Normal pulses.     Heart sounds: Normal heart sounds.  Pulmonary:     Effort: Pulmonary effort is normal.     Breath sounds: Normal breath sounds.  Musculoskeletal:     Cervical back: Normal  range of motion and neck supple.     Comments: Normal Muscle Bulk and Muscle Testing Reveals:  Upper Extremities: Full ROM and Muscle Strength 5/5  Lumbar Paraspinal Tenderness: L-4-L-5 Lower Extremities: Full ROM and Muscle Strength 5/5 Arises from Table Slowly Antalgic  Gait     Skin:    General: Skin is warm and dry.  Neurological:     Mental Status: She is alert and oriented to person, place, and time.  Psychiatric:        Mood and Affect: Mood normal.        Behavior: Behavior normal.         Assessment & Plan:  1.  History of chronic migraines with aura and cervicalgia with myofascial pain.Refilled: Dilaudid 4mg  1- tablet 4 hour as needed for severe pain. #12.    Continue  Aimovig. 07/21/2022 2. 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 S/P Lumbar ESI with Dr Wynn Banker on 11/11/2020, with good relief noted. Continue with HEP as tolerated. and Heat therapy. 07/21/2022 Refilled: Oxycodone 5 mg one tablet every 6 hours as needed #120.We will continue the opioid monitoring program, this consists of regular clinic visits, examinations, urine drug screen, pill counts as well as use of [place] Controlled Substance Reporting system. A 12 month History has been reviewed on the [place] Controlled Substance Reporting System Today.   07/21/2022 3.  Primary Bilateral Osteoarthritis of the left knee greater than right with meniscal. Chondromalacia patellae changes.Continue to monitor. Continue HEP as Tolerated. Continue to Monitor. 07/21/2022. 4. Left elbow pain:.No complaints today. Ortho Following. S/P surgery on  01/27/2019. Continue to monitor. 07/21/2022. 5. Chronic Left Shoulder pain: No complaints today. Continue HEP as Tolerated. Continue to Monitor.07/21/2022 6. Sacral Pain: No complaints today. Continue HEP as Tolerated. Continue to Monitor. Continue current medication regimen. 07/21/2022 7. Cervicalgia:  No complaints today. Continue HEP as tolerated.  Continue to monitor. 07/21/2022 8. Chronic Pain syndrome: Continue etodolac. In past she was prescribed Diclofenac, Voltaren gel and Ibuprofen, all ineffective. 07/21/2022  F/U in 2 months

## 2022-08-01 ENCOUNTER — Telehealth: Payer: Self-pay | Admitting: *Deleted

## 2022-08-01 NOTE — Telephone Encounter (Signed)
Marnee Guarneri ([place]) PA Case ID #: [id number]

## 2022-08-01 NOTE — Telephone Encounter (Signed)
Shannon Meadows ([employer]) - [id number] Aimovig 70MG /ML auto-injectors Status: PA Response - Approved

## 2022-08-31 ENCOUNTER — Other Ambulatory Visit: Payer: Self-pay | Admitting: Registered Nurse

## 2022-09-11 ENCOUNTER — Telehealth: Payer: Self-pay | Admitting: Registered Nurse

## 2022-09-11 MED ORDER — OXYCODONE-ACETAMINOPHEN 7.5-325 MG PO TABS: 1.0000 | ORAL_TABLET | Freq: Four times a day (QID) | ORAL | 0 refills | Status: DC | PRN

## 2022-09-11 NOTE — Telephone Encounter (Signed)
PMP was Reviewed.  Oxycodone e-scribed to pharmacy.  My- Chart message sent to Shannon Meadows regarding the above.  She also stated she was taking a extra tablet, she will be sent a warning letter, educated on the narcotic policy.

## 2022-09-12 ENCOUNTER — Telehealth: Payer: Self-pay | Admitting: *Deleted

## 2022-09-12 NOTE — Telephone Encounter (Signed)
Formal warning letter sent to Shannon Meadows in reference to her taking more of her medication than prescribed.

## 2022-09-19 ENCOUNTER — Encounter: Payer: Medicaid Other | Attending: Registered Nurse | Admitting: Registered Nurse

## 2022-09-19 VITALS — BP 146/93 | HR 94 | Ht 69.0 in | Wt 377.0 lb

## 2022-09-19 DIAGNOSIS — G43109 Migraine with aura, not intractable, without status migrainosus: Secondary | ICD-10-CM

## 2022-09-19 DIAGNOSIS — M170 Bilateral primary osteoarthritis of knee: Secondary | ICD-10-CM

## 2022-09-19 DIAGNOSIS — M7918 Myalgia, other site: Secondary | ICD-10-CM

## 2022-09-19 DIAGNOSIS — G894 Chronic pain syndrome: Secondary | ICD-10-CM

## 2022-09-19 DIAGNOSIS — M47816 Spondylosis without myelopathy or radiculopathy, lumbar region: Secondary | ICD-10-CM | POA: Diagnosis present

## 2022-09-19 DIAGNOSIS — M797 Fibromyalgia: Secondary | ICD-10-CM

## 2022-09-19 DIAGNOSIS — Z79891 Long term (current) use of opiate analgesic: Secondary | ICD-10-CM | POA: Diagnosis present

## 2022-09-19 DIAGNOSIS — Z5181 Encounter for therapeutic drug level monitoring: Secondary | ICD-10-CM | POA: Diagnosis present

## 2022-09-19 MED ORDER — HYDROMORPHONE HCL 4 MG PO TABS
4.0000 mg | ORAL_TABLET | ORAL | 0 refills | Status: DC | PRN
Start: 2022-09-19 — End: 2022-09-19

## 2022-09-19 MED ORDER — HYDROMORPHONE HCL 4 MG PO TABS
4.0000 mg | ORAL_TABLET | ORAL | 0 refills | Status: DC | PRN
Start: 2022-09-19 — End: 2022-11-13

## 2022-09-19 MED ORDER — AIMOVIG 70 MG/ML ~~LOC~~ SOAJ: 1.0000 mL | SUBCUTANEOUS | 3 refills | Status: DC

## 2022-09-19 NOTE — Progress Notes (Signed)
Subjective:    Patient ID: Shannon Meadows, female    DOB: 1970-04-11, 52 y.o.   MRN: [id number]  HPI: Shannon Meadows is a 52 y.o. female who returns for follow up appointment for chronic pain and medication refill. She states her pain is located in her lower back and bilateral knee pain. She also reports she has a headache, onset yesterday, she reports headache is easing off . She rates her pain 8. Her current exercise regime is walking and performing stretching exercises.  Shannon Meadows Morphine equivalent is 125.00 MME.   Oral Swab was Performed today.    Shannon Meadows reports "she is living in her car", we discussed homeless shelters, she reports she has a job interview coming up, she verbalizes understanding. Emotional support given. Sent e-mail to Child psychotherapist to gather assistance for Shannon Meadows, awaiting a reply. Shannon Meadows verbalizes understanding.     Pain Inventory Average Pain 8 Pain Right Now 8 My pain is intermittent, constant, sharp, burning, dull, stabbing, tingling, and aching  In the last 24 hours, has pain interfered with the following? General activity 7 Relation with others 7 Enjoyment of life 7 What TIME of day is your pain at its worst? morning  and daytime Sleep (in general) Fair  Pain is worse with: walking, bending, sitting, inactivity, standing, unsure, and some activites Pain improves with: rest, heat/ice, pacing activities, medication, and injections Relief from Meds: 5  Family History  Problem Relation Age of Onset   Hyperlipidemia Father    Hypertension Father    Arthritis Father    Heart disease Father    Asthma Mother    Diabetes Mother    Arthritis Mother    Heart disease Mother    Heart disease Other    Lung disease Other    Diabetes Other    Hypertension Other    Social History   Socioeconomic History   Marital status: Significant Other    Spouse name: Not on file   Number of children: Not on file   Years of education: Not on file   Highest  education level: Not on file  Occupational History   Not on file  Tobacco Use   Smoking status: Never   Smokeless tobacco: Never  Vaping Use   Vaping status: Never Used  Substance and Sexual Activity   Alcohol use: Not Currently   Drug use: Never   Sexual activity: Not Currently  Other Topics Concern   Not on file  Social History Narrative   Not on file   Social Determinants of Health   Financial Resource Strain: Medium Risk (09/15/2019)   [facility]   Overall Financial Resource Strain (CARDIA)    Difficulty of Paying Living Expenses: Somewhat hard  Food Insecurity: Medium Risk (06/23/2022)   [facility]   Food vital sign    Within the past 12 months, you worried that your food would run out before you got money to buy more: Sometimes true    Within the past 12 months, the food you bought just didn't last and you didn't have money to get more. : Sometimes true  Transportation Needs: Not on file (06/23/2022)  Physical Activity: Insufficiently Active (09/15/2019)   [facility]   Exercise Vital Sign    Days of Exercise per Week: 3 days    Minutes of Exercise per Session: 20 min  Stress: No Stress Concern Present (09/15/2019)  [facility]   [employer] of Occupational Health - Occupational Stress Questionnaire    Feeling of Stress : Only a little  Social Connections: Unknown (06/03/2021)   Received from [facility]   Social Network    Social Network: Not on file   Past Surgical History:  Procedure Laterality Date   KNEE ARTHROSCOPY     LAPOROSCOPIC SURGERY     UTEREINE   ULNAR COLLATERAL LIGAMENT RECONSTRUCTION     Past Surgical History:  Procedure Laterality Date   KNEE ARTHROSCOPY     LAPOROSCOPIC SURGERY     UTEREINE   ULNAR COLLATERAL LIGAMENT RECONSTRUCTION     Past Medical History:  Diagnosis Date   Back pain    Cervicalgia     Chronic headaches    Chronic pain syndrome    Endometriosis    Facet syndrome, lumbar    Hyperlipidemia    Hypertension    Lumbar sprain    Migraines    Myofascial pain    Neck pain    TMJ syndrome    BP (!) 146/93   Pulse 94   Ht 5\' 9"  (1.753 m)   Wt (!) 377 lb (171 kg)   SpO2 95%   BMI 55.67 kg/m   Opioid Risk Score:   Fall Risk Score:  `1  Depression screen [facility] 2/9     05/26/2022    8:22 AM 03/29/2022    8:11 AM 02/01/2022    9:21 AM 12/07/2021    8:26 AM 08/08/2021    8:35 AM 04/26/2021    9:54 AM 04/19/2021    8:38 AM  Depression screen PHQ 2/9  Decreased Interest 1 0 0 0 0 0 0  Down, Depressed, Hopeless 1 0 0 0 0 1 0  PHQ - 2 Score 2 0 0 0 0 1 0      Review of Systems  Musculoskeletal:  Positive for back pain.       Bilateral knee pain  Neurological:  Positive for headaches.  All other systems reviewed and are negative.     Objective:   Physical Exam Vitals and nursing note reviewed.  Constitutional:      Appearance: She is obese.  Cardiovascular:     Rate and Rhythm: Normal rate and regular rhythm.     Pulses: Normal pulses.     Heart sounds: Normal heart sounds.  Pulmonary:     Effort: Pulmonary effort is normal.     Breath sounds: Normal breath sounds.  Musculoskeletal:     Cervical back: Normal range of motion and neck supple.     Comments: Normal Muscle Bulk and Muscle Testing Reveals:  Upper Extremities: Full ROM and Muscle Strength 5/5 Bilateral AC Joint Tenderness Lumbar Paraspinal Tenderness: L-3-L-5 Lower Extremities: Decreased ROM and Muscle Strength 5/5 Left Lower Extremity Flexion Produces Pain into her Left Patella Arises from Table slowly Antalgic  Gait     Skin:    General: Skin is warm and dry.  Neurological:     Mental Status: She is alert and oriented to person, place, and time.  Psychiatric:        Mood and Affect: Mood normal.        Behavior: Behavior normal.         Assessment & Plan:  1.  History of chronic  migraines with aura and cervicalgia with myofascial pain.Refilled: Dilaudid 4mg  1- tablet 4 hour as needed for severe pain. #12.    Continue  Aimovig. 09/19/2022 2. 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 S/P Lumbar ESI with Dr Wynn Banker on 11/11/2020, with good relief noted. Continue with HEP as tolerated. and Heat therapy. 09/19/2022 Refilled: Oxycodone 5 mg one tablet every 6 hours as needed #120.We will continue the opioid monitoring program, this consists of regular clinic visits, examinations, urine drug screen, pill counts as well as use of [place] Controlled Substance Reporting system. A 12 month History has been reviewed on the [place] Controlled Substance Reporting System Today.   09/19/2022 3.  Primary Bilateral Osteoarthritis of the left knee greater than right with meniscal. Chondromalacia patellae changes.Continue to monitor. Continue HEP as Tolerated. Continue to Monitor. 09/19/2022. 4. Left elbow pain:.No complaints today. Ortho Following. S/P surgery on  01/27/2019. Continue to monitor. 09/19/2022. 5. Chronic Left Shoulder pain: No complaints today. Continue HEP as Tolerated. Continue to Monitor.09/19/2022 6. Sacral Pain: No complaints today. Continue HEP as Tolerated. Continue to Monitor. Continue current medication regimen. 09/19/2022 7. Cervicalgia:  No complaints today. Continue HEP as tolerated. Continue to monitor. 09/19/2022 8. Chronic Pain syndrome: Continue etodolac. In past she was prescribed Diclofenac, Voltaren gel and Ibuprofen, all ineffective. 09/19/2022   F/U in 2 months

## 2022-09-20 ENCOUNTER — Ambulatory Visit: Payer: Medicaid Other | Admitting: Registered Nurse

## 2022-09-20 ENCOUNTER — Telehealth: Payer: Self-pay | Admitting: *Deleted

## 2022-09-20 NOTE — Telephone Encounter (Signed)
Prior auth submitted for oxycodone acetaminophen 7.5/325 to [facility] via CoverMyMeds.

## 2022-09-20 NOTE — Telephone Encounter (Signed)
PA Case: [id number], Status: Approved, Coverage Starts on: 09/20/2022 12:00:00 AM, Coverage Ends on: 03/19/2023 12:00:00 AM. Authorization Expiration Date: 03/18/2023

## 2022-09-23 LAB — DRUG TOX ALC METAB W/CON, ORAL FLD: Alcohol Metabolite: NEGATIVE ng/mL (ref <25)

## 2022-09-23 LAB — DRUG TOX MONITOR 1 W/CONF, ORAL FLD
Amphetamines: NEGATIVE ng/mL (ref <10)
Barbiturates: NEGATIVE ng/mL (ref <10)
Benzodiazepines: NEGATIVE ng/mL (ref <0.50)
Buprenorphine: NEGATIVE ng/mL (ref <0.10)
Cocaine: NEGATIVE ng/mL (ref <5.0)
Codeine: NEGATIVE ng/mL (ref <2.5)
Dihydrocodeine: NEGATIVE ng/mL (ref <2.5)
Fentanyl: NEGATIVE ng/mL (ref <0.10)
Heroin Metabolite: NEGATIVE ng/mL (ref <1.0)
Hydrocodone: NEGATIVE ng/mL (ref <2.5)
Hydromorphone: NEGATIVE ng/mL (ref <2.5)
MARIJUANA: NEGATIVE ng/mL (ref <2.5)
MDMA: NEGATIVE ng/mL (ref <10)
Meprobamate: NEGATIVE ng/mL (ref <2.5)
Methadone: NEGATIVE ng/mL (ref <5.0)
Morphine: NEGATIVE ng/mL (ref <2.5)
Nicotine Metabolite: NEGATIVE ng/mL (ref <5.0)
Norhydrocodone: NEGATIVE ng/mL (ref <2.5)
Noroxycodone: 14.7 ng/mL — ABNORMAL HIGH (ref <2.5)
Opiates: POSITIVE ng/mL — AB (ref <2.5)
Oxycodone: 121.6 ng/mL — ABNORMAL HIGH (ref <2.5)
Oxymorphone: NEGATIVE ng/mL (ref <2.5)
Phencyclidine: NEGATIVE ng/mL (ref <10)
Tapentadol: NEGATIVE ng/mL (ref <5.0)
Tramadol: NEGATIVE ng/mL (ref <5.0)
Zolpidem: NEGATIVE ng/mL (ref <5.0)

## 2022-09-27 ENCOUNTER — Encounter: Payer: Self-pay | Admitting: Registered Nurse

## 2022-10-02 ENCOUNTER — Telehealth: Payer: Self-pay | Admitting: Registered Nurse

## 2022-10-02 MED ORDER — PREGABALIN 300 MG PO CAPS: 300.0000 mg | ORAL_CAPSULE | Freq: Two times a day (BID) | ORAL | 2 refills | Status: DC

## 2022-10-02 NOTE — Telephone Encounter (Signed)
PMP was Reviewed.  Pregabalin e-scribed to pharmacy.  Shannon Meadows is aware via My-Chart

## 2022-10-18 ENCOUNTER — Telehealth: Payer: Self-pay | Admitting: *Deleted

## 2022-10-18 MED ORDER — OXYCODONE-ACETAMINOPHEN 7.5-325 MG PO TABS: 1.0000 | ORAL_TABLET | Freq: Four times a day (QID) | ORAL | 0 refills | Status: DC | PRN

## 2022-10-18 NOTE — Telephone Encounter (Signed)
Rx written.

## 2022-10-18 NOTE — Telephone Encounter (Signed)
New prior auth sent to [facility] because Dr Riley Kill had to write the Rx and she is on Medicaid lock with Jacalyn Lefevre NP.

## 2022-10-19 ENCOUNTER — Encounter: Payer: Self-pay | Admitting: *Deleted

## 2022-10-19 NOTE — Telephone Encounter (Signed)
Outcome Approved today by [employer] [place] Case: [id number], Status: Approved, Coverage Starts on: 10/19/2022 12:00:00 AM, Coverage Ends on: 04/17/2023 12:00:00 AM. Shannon Meadows notified by MyChart message.

## 2022-11-13 ENCOUNTER — Encounter: Payer: Self-pay | Admitting: Registered Nurse

## 2022-11-13 ENCOUNTER — Encounter: Payer: Medicaid Other | Attending: Registered Nurse | Admitting: Registered Nurse

## 2022-11-13 VITALS — BP 134/84 | HR 80 | Ht 69.0 in | Wt 385.0 lb

## 2022-11-13 DIAGNOSIS — Z5181 Encounter for therapeutic drug level monitoring: Secondary | ICD-10-CM | POA: Insufficient documentation

## 2022-11-13 DIAGNOSIS — M797 Fibromyalgia: Secondary | ICD-10-CM | POA: Diagnosis present

## 2022-11-13 DIAGNOSIS — M47816 Spondylosis without myelopathy or radiculopathy, lumbar region: Secondary | ICD-10-CM | POA: Diagnosis not present

## 2022-11-13 DIAGNOSIS — M25512 Pain in left shoulder: Secondary | ICD-10-CM | POA: Diagnosis present

## 2022-11-13 DIAGNOSIS — M7061 Trochanteric bursitis, right hip: Secondary | ICD-10-CM | POA: Insufficient documentation

## 2022-11-13 DIAGNOSIS — M25511 Pain in right shoulder: Secondary | ICD-10-CM | POA: Insufficient documentation

## 2022-11-13 DIAGNOSIS — G894 Chronic pain syndrome: Secondary | ICD-10-CM | POA: Diagnosis present

## 2022-11-13 DIAGNOSIS — G8929 Other chronic pain: Secondary | ICD-10-CM | POA: Insufficient documentation

## 2022-11-13 DIAGNOSIS — G43109 Migraine with aura, not intractable, without status migrainosus: Secondary | ICD-10-CM | POA: Insufficient documentation

## 2022-11-13 DIAGNOSIS — M7918 Myalgia, other site: Secondary | ICD-10-CM | POA: Diagnosis present

## 2022-11-13 DIAGNOSIS — M170 Bilateral primary osteoarthritis of knee: Secondary | ICD-10-CM | POA: Insufficient documentation

## 2022-11-13 DIAGNOSIS — Z79891 Long term (current) use of opiate analgesic: Secondary | ICD-10-CM | POA: Diagnosis present

## 2022-11-13 DIAGNOSIS — M542 Cervicalgia: Secondary | ICD-10-CM | POA: Insufficient documentation

## 2022-11-13 MED ORDER — HYDROMORPHONE HCL 4 MG PO TABS: 4.0000 mg | ORAL_TABLET | ORAL | 0 refills | Status: DC | PRN

## 2022-11-13 MED ORDER — OXYCODONE-ACETAMINOPHEN 7.5-325 MG PO TABS: 1.0000 | ORAL_TABLET | Freq: Four times a day (QID) | ORAL | 0 refills | Status: DC | PRN

## 2022-11-13 NOTE — Progress Notes (Signed)
Subjective:    Patient ID: Shannon Meadows, female    DOB: [birth year], 52 y.o.   MRN: [id number]  HPI: Shannon Meadows is a 52 y.o. female who returns for follow up appointment for chronic pain and medication refill. She states her pain is located in her neck . Bilateral shoulder pain, lower back, right hip and bilateral knee pain. She also reports she has a migraine for the last three days and it is easing off. She rates her pain 7. Her current exercise regime is walking and performing stretching exercises.  She would like to be scheduled with Dr Wynn Banker for [facility] injection, she states he last one was 10/05/2022 and 10/24/2022, she will be scheduled with Dr Wynn Banker to discuss injection, she verbalizes understanding.   [name] Morphine equivalent is 93.00 MME.   Last Oral Swab was Performed on 09/19/2022, it was consistent.   [name] reports she sleeps in her car at night, we discussed shelters , she states she has tried shelters in the past. Emotional support given and she was encouraged to try shelters again, she verbalizes understanding.    Pain Inventory Average Pain 8 Pain Right Now 7 My pain is intermittent, constant, sharp, burning, dull, stabbing, tingling, and aching  In the last 24 hours, has pain interfered with the following? General activity 8 Relation with others 8 Enjoyment of life 8 What TIME of day is your pain at its worst? daytime Sleep (in general) Poor  Pain is worse with: walking, bending, sitting, inactivity, standing, and some activites Pain improves with: rest, heat/ice, therapy/exercise, pacing activities, medication, TENS, and injections Relief from Meds: 6  Family History  Problem Relation Age of Onset   Hyperlipidemia Father    Hypertension Father    Arthritis Father    Heart disease Father    Asthma Mother    Diabetes Mother    Arthritis Mother    Heart disease Mother    Heart disease Other    Lung disease Other    Diabetes Other    Hypertension  Other    Social History   Socioeconomic History   Marital status: Significant Other    Spouse name: Not on file   Number of children: Not on file   Years of education: Not on file   Highest education level: Not on file  Occupational History   Not on file  Tobacco Use   Smoking status: Never   Smokeless tobacco: Never  Vaping Use   Vaping status: Never Used  Substance and Sexual Activity   Alcohol use: Not Currently   Drug use: Never   Sexual activity: Not Currently  Other Topics Concern   Not on file  Social History Narrative   Not on file   Social Determinants of Health   Financial Resource Strain: Medium Risk (09/15/2019)   [facility]   Overall Financial Resource Strain (CARDIA)    Difficulty of Paying Living Expenses: Somewhat hard  Food Insecurity: High Risk (10/17/2022)   Received from [facility]   Hunger Vital Sign    Worried About Running Out of Food in the Last Year: Often true    Ran Out of Food in the Last Year: Sometimes true  Transportation Needs: Unmet Transportation Needs (10/17/2022)   Received from [facility]    In the past 12 months, has lack of reliable transportation kept you from medical appointments, meetings, work or from getting things needed for daily  living? : Yes  Physical Activity: Insufficiently Active (09/15/2019)   [facility]   Exercise Vital Sign    Days of Exercise per Week: 3 days    Minutes of Exercise per Session: 20 min  Stress: No Stress Concern Present (09/15/2019)   [facility]   [employer] of Occupational Health - Occupational Stress Questionnaire    Feeling of Stress : Only a little  Social Connections: Unknown (06/03/2021)   [facility] from [facility]   Social Network    Social Network: Not on file   Past Surgical History:  Procedure Laterality Date   KNEE ARTHROSCOPY     LAPOROSCOPIC  SURGERY     UTEREINE   ULNAR COLLATERAL LIGAMENT RECONSTRUCTION     Past Surgical History:  Procedure Laterality Date   KNEE ARTHROSCOPY     LAPOROSCOPIC SURGERY     UTEREINE   ULNAR COLLATERAL LIGAMENT RECONSTRUCTION     Past Medical History:  Diagnosis Date   Back pain    Cervicalgia    Chronic headaches    Chronic pain syndrome    Endometriosis    Facet syndrome, lumbar    Hyperlipidemia    Hypertension    Lumbar sprain    Migraines    Myofascial pain    Neck pain    TMJ syndrome    There were no vitals taken for this visit.  Opioid Risk Score:   Fall Risk Score:  `1  Depression screen [facility] 2/9     05/26/2022    8:22 AM 03/29/2022    8:11 AM 02/01/2022    9:21 AM 12/07/2021    8:26 AM 08/08/2021    8:35 AM 04/26/2021    9:54 AM 04/19/2021    8:38 AM  Depression screen PHQ 2/9  Decreased Interest 1 0 0 0 0 0 0  Down, Depressed, Hopeless 1 0 0 0 0 1 0  PHQ - 2 Score 2 0 0 0 0 1 0    Review of Systems  Musculoskeletal:  Positive for back pain, gait problem and neck pain.       Right hip pain, pain in both knees  Neurological:  Positive for headaches.      Objective:   Physical Exam Vitals and nursing note reviewed.  Constitutional:      Appearance: Normal appearance. She is obese.  Neck:     Comments: Cervical Paraspinal Tenderness: C-5-C-6 Cardiovascular:     Rate and Rhythm: Normal rate and regular rhythm.     Pulses: Normal pulses.     Heart sounds: Normal heart sounds.  Pulmonary:     Effort: Pulmonary effort is normal.     Breath sounds: Normal breath sounds.  Musculoskeletal:     Cervical back: Normal range of motion and neck supple.     Right lower leg: Edema present.     Left lower leg: Edema present.     Comments: Normal Muscle Bulk and Muscle Testing Reveals:  Upper Extremities: Full ROM and Muscle Strength 5/5 Bilateral AC Joint Tenderness Lumbar Paraspinal Tenderness: L-4-L-5 Right Greater Trochanter Tenderness Lower Extremities: Full ROM  and Muscle Strength 5/5 Bilateral Lower extremities Flexion Produces Pain into her Bilateral Patella's  Arises from Table slowly Antalgic  Gait     Skin:    General: Skin is warm and dry.  Neurological:     Mental Status: She is alert and oriented to person, place, and time.  Psychiatric:  Mood and Affect: Mood normal.        Behavior: Behavior normal.         Assessment & Plan:  1.  History of chronic migraines with aura and cervicalgia with myofascial pain.Refilled: Dilaudid 4mg  1- tablet 4 hour as needed for severe pain. #12.    Continue  Aimovig. 11/13/2022 2. 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 S/P Lumbar ESI with Dr Wynn Banker on 11/11/2020, with good relief noted. She would like to be scheduled with Dr Wynn Banker to discuss ESI, she will be scheduled with Dr Shon Hough. Continue with HEP as tolerated. and Heat therapy. 11/13/2022 Refilled: Oxycodone 7.5 mg one tablet every 6 hours as needed #120.We will continue the opioid monitoring program, this consists of regular clinic visits, examinations, urine drug screen, pill counts as well as use of [place] Controlled Substance Reporting system. A 12 month History has been reviewed on the [place] Controlled Substance Reporting System on 11/13/2022. Second script sent for the following month.  3.  Primary Bilateral Osteoarthritis of the left knee greater than right with meniscal. Chondromalacia patellae changes.Continue to monitor. Continue HEP as Tolerated. Continue to Monitor. 11/13/2022. 4. Left elbow pain:.No complaints today. Ortho Following. S/P surgery on  01/27/2019. Continue to monitor. 11/13/2022. 5. Chronic Bilateral Shoulder pain: Continue HEP as Tolerated. Continue to Monitor.11/13/2022 6. Sacral Pain: No complaints today. Continue HEP as Tolerated. Continue to Monitor. Continue current medication regimen. 11/13/2022 7. Cervicalgia: . Continue HEP as tolerated. Continue to monitor.  11/13/2022 8. Chronic Pain syndrome: Continue etodolac. In past she was prescribed Diclofenac, Voltaren gel and Ibuprofen, all ineffective. 11/13/2022   F/U in 1 month with Dr Wynn Banker

## 2022-12-05 ENCOUNTER — Encounter: Payer: Self-pay | Admitting: Physical Medicine & Rehabilitation

## 2022-12-05 ENCOUNTER — Encounter: Payer: Medicaid Other | Attending: Registered Nurse | Admitting: Physical Medicine & Rehabilitation

## 2022-12-05 VITALS — BP 127/85 | HR 76 | Ht 69.0 in | Wt >= 6400 oz

## 2022-12-05 DIAGNOSIS — M4727 Other spondylosis with radiculopathy, lumbosacral region: Secondary | ICD-10-CM | POA: Insufficient documentation

## 2022-12-05 NOTE — Progress Notes (Signed)
Subjective:    Patient ID: Shannon Meadows, female    DOB: 08-Nov-1970, 52 y.o.   MRN: [id number]  HPI 52 year old female with history of morbid obesity, lumbar spondylosis as well as lumbar radicular symptoms who returns today with complaints of bilateral back pain as well as lower extremity pain.  Approximately 1 year ago she did have a fall resulting in increased right-sided low back pain as well as increased pain going down the right leg.  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  Her Worker's Comp. case has been settled and she is no longer working.  She has gained weight with her reduced activity level.  She has been seeing Dr. Caren Hazy at this clinic as well as Riley Lam the nurse practitioner.  In addition she has seen orthopedic surgery at [facility] in [facility].  In the past she has also seen [facility].  The patient has had no progressive lower extremity weakness.  No new bowel or bladder dysfunction. Her medications 4 mg prescribed 12 tablets/month as a rescue medicine, oxycodone 7.5 mg 4 times daily scheduled Lyrica 300 mg twice daily MRI - August 2024, report reviewed images not available , L4-5 central stenosis possibly impinging on L4 nerve roots  No formal PT but has done stretching  Last [facility] October 2024 Left L5-S1 paramedian Interlaminar epidural steroid injection under fluoroscopic guidance Legs get numbness with prolonged sitting  Uses walker pn "as needed" basis Pain Inventory Average Pain 8 Pain Right Now 9 My pain is intermittent, constant, sharp, burning, dull, stabbing, tingling, and aching  In the last 24 hours, has pain interfered with the following? General activity 7 Relation with others 8 Enjoyment of life 7 What TIME of day is your pain at its worst? daytime Sleep (in general) Fair  Pain is worse with: walking, bending, sitting, inactivity, standing, and some activites Pain improves with: rest, heat/ice, therapy/exercise, pacing  activities, medication, TENS, and injections Relief from Meds: 5  Family History  Problem Relation Age of Onset   Hyperlipidemia Father    Hypertension Father    Arthritis Father    Heart disease Father    Asthma Mother    Diabetes Mother    Arthritis Mother    Heart disease Mother    Heart disease Other    Lung disease Other    Diabetes Other    Hypertension Other    Social History   Socioeconomic History   Marital status: Significant Other    Spouse name: Not on file   Number of children: Not on file   Years of education: Not on file   Highest education level: Not on file  Occupational History   Not on file  Tobacco Use   Smoking status: Never   Smokeless tobacco: Never  Vaping Use   Vaping status: Never Used  Substance and Sexual Activity   Alcohol use: Not Currently   Drug use: Never   Sexual activity: Not Currently  Other Topics Concern   Not on file  Social History Narrative   Not on file   Social Determinants of Health   Financial Resource Strain: Medium Risk (09/15/2019)   [facility]   Overall Financial Resource Strain (CARDIA)    Difficulty of Paying Living Expenses: Somewhat hard  Food Insecurity: High Risk (10/17/2022)   Received from [facility]   Hunger Vital Sign    Worried About Running Out of Food in the Last  Year: Often true    Ran Out of Food in the Last Year: Sometimes true  Transportation Needs: Unmet Transportation Needs (10/17/2022)   Received from [facility]    In the past 12 months, has lack of reliable transportation kept you from medical appointments, meetings, work or from getting things needed for daily living? : Yes  Physical Activity: Insufficiently Active (09/15/2019)   [facility]   Exercise Vital Sign    Days of Exercise per Week: 3 days    Minutes of Exercise per Session: 20 min  Stress: No Stress Concern Present (09/15/2019)   [facility]   [employer] of Occupational Health - Occupational Stress Questionnaire    Feeling of Stress : Only a little  Social Connections: Unknown (06/03/2021)   [facility]   Social Network    Social Network: Not on file   Past Surgical History:  Procedure Laterality Date   KNEE ARTHROSCOPY     LAPOROSCOPIC SURGERY     UTEREINE   ULNAR COLLATERAL LIGAMENT RECONSTRUCTION     Past Surgical History:  Procedure Laterality Date   KNEE ARTHROSCOPY     LAPOROSCOPIC SURGERY     UTEREINE   ULNAR COLLATERAL LIGAMENT RECONSTRUCTION     Past Medical History:  Diagnosis Date   Back pain    Cervicalgia    Chronic headaches    Chronic pain syndrome    Endometriosis    Facet syndrome, lumbar    Hyperlipidemia    Hypertension    Lumbar sprain    Migraines    Myofascial pain    Neck pain    TMJ syndrome    BP 127/85   Pulse 76   Ht 5\' 9"  (1.753 m)   Wt (!) 403 lb (182.8 kg)   SpO2 93%   BMI 59.51 kg/m   Opioid Risk Score:   Fall Risk Score:  `1  Depression screen [facility] 2/9     11/13/2022    8:22 AM 05/26/2022    8:22 AM 03/29/2022    8:11 AM 02/01/2022    9:21 AM 12/07/2021    8:26 AM 08/08/2021    8:35 AM 04/26/2021    9:54 AM  Depression screen PHQ 2/9  Decreased Interest 0 1 0 0 0 0 0  Down, Depressed, Hopeless 0 1 0 0 0 0 1  PHQ - 2 Score 0 2 0 0 0 0 1     Review of Systems  Musculoskeletal:  Positive for back pain and gait problem.       Bilateral knee pain  All other systems reviewed and are negative.     Objective:   Physical Exam Morbidly obese female no acute distress Mood and affect without lability or agitation Speech without dysarthria or aphasia Lower extremity strength is 5/5 bilateral hip flexor knee extensor ankle dorsiflexor Lumbar spine range of motion is reduced she has 50% flexion and 25% extension.  Extension is more painful than flexion.  She is able to perform lateral bending to the  right and to the left about 25% of normal range of motion. Negative straight leg raise bilaterally Sensation reduced right L5 and right S1 dermatomal distribution.       Assessment & Plan:   1.  Lumbar spondylosis, lumbar stenosis with radiculopathy she has sensory loss right L5-S1 but complains of pain bilaterally. She has had good results in the past with  L5-S1 translaminar lumbar epidural steroid injection.  6 inch needle was utilized due to patient's body habitus. We discussed epidural injections are a short-term treatment option and that declining mobility and increasing weight need to be addressed as well.  The patient does have some physical therapy ordered by another physician and I encouraged her to complete this. Will schedule for L5-S1 translaminar right paramedian approach.  She will need a driver. We discussed treatment options.  To look through multiple records from outside sources as part of this visit over 30 minutes was spent on this visit.

## 2022-12-14 ENCOUNTER — Other Ambulatory Visit: Payer: Self-pay | Admitting: Registered Nurse

## 2022-12-14 DIAGNOSIS — M797 Fibromyalgia: Secondary | ICD-10-CM

## 2022-12-14 DIAGNOSIS — M170 Bilateral primary osteoarthritis of knee: Secondary | ICD-10-CM

## 2022-12-14 DIAGNOSIS — G894 Chronic pain syndrome: Secondary | ICD-10-CM

## 2022-12-14 DIAGNOSIS — M7918 Myalgia, other site: Secondary | ICD-10-CM

## 2023-01-04 NOTE — Progress Notes (Unsigned)
Subjective:    Patient ID: Shannon Meadows, female    DOB: 1970-03-22, 52 y.o.   MRN: [id number]  HPI   Pain Inventory Average Pain {NUMBERS; 0-10:5044} Pain Right Now {NUMBERS; 0-10:5044} My pain is {PAIN DESCRIPTION:21022940}  In the last 24 hours, has pain interfered with the following? General activity {NUMBERS; 0-10:5044} Relation with others {NUMBERS; 0-10:5044} Enjoyment of life {NUMBERS; 0-10:5044} What TIME of day is your pain at its worst? {time of day:24191} Sleep (in general) {BHH GOOD/FAIR/POOR:22877}  Pain is worse with: {ACTIVITIES:21022942} Pain improves with: {PAIN IMPROVES RJJO:84166063} Relief from Meds: {NUMBERS; 0-10:5044}  Family History  Problem Relation Age of Onset   Hyperlipidemia Father    Hypertension Father    Arthritis Father    Heart disease Father    Asthma Mother    Diabetes Mother    Arthritis Mother    Heart disease Mother    Heart disease Other    Lung disease Other    Diabetes Other    Hypertension Other    Social History   Socioeconomic History   Marital status: Significant Other    Spouse name: Not on file   Number of children: Not on file   Years of education: Not on file   Highest education level: Not on file  Occupational History   Not on file  Tobacco Use   Smoking status: Never   Smokeless tobacco: Never  Vaping Use   Vaping status: Never Used  Substance and Sexual Activity   Alcohol use: Not Currently   Drug use: Never   Sexual activity: Not Currently  Other Topics Concern   Not on file  Social History Narrative   Not on file   Social Drivers of Health   Financial Resource Strain: Medium Risk (09/15/2019)   [facility]   Overall Financial Resource Strain (CARDIA)    Difficulty of Paying Living Expenses: Somewhat hard  Food Insecurity: High Risk (10/17/2022)   Received from [facility]   Hunger Vital Sign    Worried About Running Out of Food in the Last Year: Often true     Ran Out of Food in the Last Year: Sometimes true  Transportation Needs: Unmet Transportation Needs (10/17/2022)   Received from [facility]    In the past 12 months, has lack of reliable transportation kept you from medical appointments, meetings, work or from getting things needed for daily living? : Yes  Physical Activity: Insufficiently Active (09/15/2019)   [facility]   Exercise Vital Sign    Days of Exercise per Week: 3 days    Minutes of Exercise per Session: 20 min  Stress: No Stress Concern Present (09/15/2019)   [facility]   [employer] of Occupational Health - Occupational Stress Questionnaire    Feeling of Stress : Only a little  Social Connections: Unknown (06/03/2021)   [facility]   Social Network    Social Network: Not on file   Past Surgical History:  Procedure Laterality Date   KNEE ARTHROSCOPY     LAPOROSCOPIC SURGERY     UTEREINE   ULNAR COLLATERAL LIGAMENT RECONSTRUCTION     Past Surgical History:  Procedure Laterality Date   KNEE ARTHROSCOPY     LAPOROSCOPIC SURGERY     UTEREINE   ULNAR COLLATERAL LIGAMENT RECONSTRUCTION     Past Medical History:  Diagnosis Date   Back pain  Cervicalgia    Chronic headaches    Chronic pain syndrome    Endometriosis    Facet syndrome, lumbar    Hyperlipidemia    Hypertension    Lumbar sprain    Migraines    Myofascial pain    Neck pain    TMJ syndrome    There were no vitals taken for this visit.  Opioid Risk Score:   Fall Risk Score:  `1  Depression screen [facility] 2/9     11/13/2022    8:22 AM 05/26/2022    8:22 AM 03/29/2022    8:11 AM 02/01/2022    9:21 AM 12/07/2021    8:26 AM 08/08/2021    8:35 AM 04/26/2021    9:54 AM  Depression screen PHQ 2/9  Decreased Interest 0 1 0 0 0 0 0  Down, Depressed, Hopeless 0 1 0 0 0 0 1  PHQ - 2 Score 0 2 0 0 0 0 1    Review of Systems      Objective:   Physical Exam        Assessment & Plan:

## 2023-01-08 ENCOUNTER — Encounter: Payer: Medicaid Other | Admitting: Registered Nurse

## 2023-01-08 ENCOUNTER — Other Ambulatory Visit: Payer: Self-pay | Admitting: Registered Nurse

## 2023-01-10 NOTE — Progress Notes (Signed)
Subjective:    Patient ID: Shannon Meadows, female    DOB: [birth year], 52 y.o.   MRN: [id number]  HPI: Shannon Meadows is a 52 y.o. female who called office reporting she has COVID, her appointment was changed to Virtual visit.  I connected with [name] by a video enabled telemedicine application and verified that I am speaking with the correct person using two identifiers.  Location: Patient: In her Home  Provider: In the office    I discussed the limitations of evaluation and management by telemedicine and the availability of in person appointments. The patient expressed understanding and agreed to proceed.  She states her pain is located in her neck, ( reports she has a migraine) lower back, fibro pain and bilateral knees . She rates her pain 8. Her current exercise regime is walking and performing stretching exercises.  [name] reports she hasn't taken a COVID test, she reports COVID symptoms. She was instructed to performed a COVID test and F/U with he rPCP, she verbalizes understanding.   [name] Morphine equivalent is 165.00 MME.   Last Oral Swab was Performed on 09/19/2022, it was consistent.      Pain Inventory Average Pain 8 Pain Right Now 8 My pain is intermittent, constant, sharp, burning, dull, stabbing, tingling, and aching  In the last 24 hours, has pain interfered with the following? General activity 7 Relation with others 7 Enjoyment of life 9 What TIME of day is your pain at its worst? morning , daytime, evening, and night Sleep (in general) Poor  Pain is worse with: inactivity, standing, and some activites Pain improves with: rest, pacing activities, and medication Relief from Meds: 5.  Family History  Problem Relation Age of Onset   Hyperlipidemia Father    Hypertension Father    Arthritis Father    Heart disease Father    Asthma Mother    Diabetes Mother    Arthritis Mother    Heart disease Mother    Heart disease Other    Lung disease Other     Diabetes Other    Hypertension Other    Social History   Socioeconomic History   Marital status: Significant Other    Spouse name: Not on file   Number of children: Not on file   Years of education: Not on file   Highest education level: Not on file  Occupational History   Not on file  Tobacco Use   Smoking status: Never   Smokeless tobacco: Never  Vaping Use   Vaping status: Never Used  Substance and Sexual Activity   Alcohol use: Not Currently   Drug use: Never   Sexual activity: Not Currently  Other Topics Concern   Not on file  Social History Narrative   Not on file   Social Drivers of Health   Financial Resource Strain: Medium Risk (09/15/2019)   [facility]   Overall Financial Resource Strain (CARDIA)    Difficulty of Paying Living Expenses: Somewhat hard  Food Insecurity: High Risk (10/17/2022)   Received from [facility]   Hunger Vital Sign    Worried About Running Out of Food in the Last Year: Often true    Ran Out of Food in the Last Year: Sometimes true  Transportation Needs: Unmet Transportation Needs (10/17/2022)   Received from [facility]    In the past 12 months, has lack of reliable transportation kept you from medical appointments,  meetings, work or from getting things needed for daily living? : Yes  Physical Activity: Insufficiently Active (09/15/2019)   [facility]   Exercise Vital Sign    Days of Exercise per Week: 3 days    Minutes of Exercise per Session: 20 min  Stress: No Stress Concern Present (09/15/2019)   [facility]   [employer] of Occupational Health - Occupational Stress Questionnaire    Feeling of Stress : Only a little  Social Connections: Unknown (06/03/2021)   [facility]   Social Network    Social Network: Not on file   Past Surgical History:  Procedure Laterality Date   KNEE  ARTHROSCOPY     LAPOROSCOPIC SURGERY     UTEREINE   ULNAR COLLATERAL LIGAMENT RECONSTRUCTION     Past Surgical History:  Procedure Laterality Date   KNEE ARTHROSCOPY     LAPOROSCOPIC SURGERY     UTEREINE   ULNAR COLLATERAL LIGAMENT RECONSTRUCTION     Past Medical History:  Diagnosis Date   Back pain    Cervicalgia    Chronic headaches    Chronic pain syndrome    Endometriosis    Facet syndrome, lumbar    Hyperlipidemia    Hypertension    Lumbar sprain    Migraines    Myofascial pain    Neck pain    TMJ syndrome    There were no vitals taken for this visit.  Opioid Risk Score:   Fall Risk Score:  `1  Depression screen [facility] 2/9     11/13/2022    8:22 AM 05/26/2022    8:22 AM 03/29/2022    8:11 AM 02/01/2022    9:21 AM 12/07/2021    8:26 AM 08/08/2021    8:35 AM 04/26/2021    9:54 AM  Depression screen PHQ 2/9  Decreased Interest 0 1 0 0 0 0 0  Down, Depressed, Hopeless 0 1 0 0 0 0 1  PHQ - 2 Score 0 2 0 0 0 0 1    Review of Systems  Musculoskeletal:  Positive for back pain and gait problem.       Pain knees  Neurological:  Positive for headaches.  All other systems reviewed and are negative.      Objective:   Physical Exam Vitals and nursing note reviewed.  Musculoskeletal:     Comments: No Physical Exam Performed: Virtual Visit          Assessment & Plan:  1.  History of chronic migraines with aura and cervicalgia with myofascial pain.Refilled: Dilaudid 4mg  1- tablet 4 hour as needed for severe pain. #12.    Continue  Aimovig. 01/12/2023 2. 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 S/P Lumbar ESI with Dr Wynn Banker on 11/11/2020, with good relief noted. She has a  scheduled appointment with Dr Wynn Banker for [facility], in January. Continue with HEP as tolerated. and Heat therapy. 01/12/2023 Refilled: Oxycodone 7.5 mg one tablet every 6 hours as needed #120.We will continue the opioid monitoring program, this consists of regular clinic visits,  examinations, urine drug screen, pill counts as well as use of [place] Controlled Substance Reporting system. A 12 month History has been reviewed on the [place] Controlled Substance Reporting System on 01/12/2023. Second script sent for the following month.  3.  Primary Bilateral Osteoarthritis of the left knee greater than right with meniscal. Chondromalacia patellae changes.Continue to monitor.  Continue HEP as Tolerated. Continue to Monitor. 01/12/2023. 4. Left elbow pain:.No complaints today. Ortho Following. S/P surgery on  01/27/2019. Continue to monitor. 01/12/2023. 5. Chronic Bilateral Shoulder pain: No complaints today. Continue HEP as Tolerated. Continue to Monitor.01/12/2023 6. Sacral Pain: No complaints today. Continue HEP as Tolerated. Continue to Monitor. Continue current medication regimen. 01/12/2023 7. Cervicalgia: . Continue HEP as tolerated. Continue to monitor. 01/12/2023 8. Chronic Pain syndrome: Continue etodolac. In past she was prescribed Diclofenac, Voltaren gel and Ibuprofen, all ineffective. 01/12/2023   F/U in 1 month with Dr Wynn Banker    Virtual Visit Established Patient Location of Patient in her home Location of Provider in the office  100 % Audio and Video connection was used for the duration of visit

## 2023-01-12 ENCOUNTER — Encounter: Payer: Self-pay | Admitting: Registered Nurse

## 2023-01-12 ENCOUNTER — Encounter: Payer: Medicaid Other | Attending: Registered Nurse | Admitting: Registered Nurse

## 2023-01-12 VITALS — Ht 69.0 in

## 2023-01-12 DIAGNOSIS — M4727 Other spondylosis with radiculopathy, lumbosacral region: Secondary | ICD-10-CM | POA: Diagnosis not present

## 2023-01-12 DIAGNOSIS — M170 Bilateral primary osteoarthritis of knee: Secondary | ICD-10-CM | POA: Insufficient documentation

## 2023-01-12 DIAGNOSIS — M542 Cervicalgia: Secondary | ICD-10-CM | POA: Insufficient documentation

## 2023-01-12 DIAGNOSIS — M7918 Myalgia, other site: Secondary | ICD-10-CM | POA: Insufficient documentation

## 2023-01-12 DIAGNOSIS — M797 Fibromyalgia: Secondary | ICD-10-CM | POA: Insufficient documentation

## 2023-01-12 DIAGNOSIS — G43109 Migraine with aura, not intractable, without status migrainosus: Secondary | ICD-10-CM | POA: Diagnosis not present

## 2023-01-12 DIAGNOSIS — G894 Chronic pain syndrome: Secondary | ICD-10-CM | POA: Insufficient documentation

## 2023-01-12 DIAGNOSIS — Z79891 Long term (current) use of opiate analgesic: Secondary | ICD-10-CM | POA: Diagnosis present

## 2023-01-12 DIAGNOSIS — Z5181 Encounter for therapeutic drug level monitoring: Secondary | ICD-10-CM | POA: Diagnosis present

## 2023-01-12 MED ORDER — OXYCODONE-ACETAMINOPHEN 7.5-325 MG PO TABS: 1.0000 | ORAL_TABLET | Freq: Four times a day (QID) | ORAL | 0 refills | Status: DC | PRN

## 2023-01-12 MED ORDER — HYDROMORPHONE HCL 4 MG PO TABS: 4.0000 mg | ORAL_TABLET | ORAL | 0 refills | Status: DC | PRN

## 2023-02-06 ENCOUNTER — Encounter: Payer: Medicaid Other | Attending: Registered Nurse | Admitting: Physical Medicine & Rehabilitation

## 2023-02-06 ENCOUNTER — Encounter: Payer: Self-pay | Admitting: Physical Medicine & Rehabilitation

## 2023-02-06 VITALS — BP 180/96 | HR 86 | Temp 97.8°F | Ht 69.0 in | Wt >= 6400 oz

## 2023-02-06 DIAGNOSIS — M4727 Other spondylosis with radiculopathy, lumbosacral region: Secondary | ICD-10-CM | POA: Insufficient documentation

## 2023-02-06 MED ORDER — DEXAMETHASONE SODIUM PHOSPHATE 10 MG/ML IJ SOLN
10.0000 mg | Freq: Once | INTRAMUSCULAR | Status: AC
  Administered 2023-02-06: 10 mg via INTRAVENOUS

## 2023-02-06 MED ORDER — LIDOCAINE HCL 1 % IJ SOLN
5.0000 mL | Freq: Once | INTRAMUSCULAR | Status: AC
  Administered 2023-02-06: 5 mL

## 2023-02-06 MED ORDER — LIDOCAINE HCL (PF) 1 % IJ SOLN
2.0000 mL | Freq: Once | INTRAMUSCULAR | Status: AC
  Administered 2023-02-06: 2 mL

## 2023-02-06 MED ORDER — IOHEXOL 180 MG/ML  SOLN
3.0000 mL | Freq: Once | INTRAMUSCULAR | Status: AC
Start: 2023-02-06 — End: 2023-02-06
  Administered 2023-02-06: 3 mL via INTRAVENOUS

## 2023-02-06 NOTE — Patient Instructions (Signed)

## 2023-02-06 NOTE — Progress Notes (Signed)
 PROCEDURE RECORD [facility]   Name: Shannon Meadows DOB:1970-03-11 MRN: [id number]  Date:02/06/2023  Physician: Prentice Compton, MD    Nurse/CMA: Claron Rosencrans RMA  Allergies:  Allergies  Allergen Reactions   Chlorpromazine Anaphylaxis and Anxiety    Other reaction(s): Anaphylaxis   Clonidine     Other reaction(s): Other (See Comments) INTOLERANCE- bradycardia Other reaction(s): Bradycardia (disorder) Other reaction(s): bradycardia   Clonidine Derivatives Other (See Comments)    INTOLERANCE  Other Reaction(s): Other  Other Reaction(s): Bradycardia (disorder)    Other reaction(s): Other (See Comments) INTOLERANCE- bradycardia Other reaction(s): Bradycardia (disorder) Other reaction(s): bradycardia  Other reaction(s): bradycardia, Other (See Comments), Other (See Comments)  INTOLERANCE  Other reaction(s): Other (See Comments)  INTOLERANCE- bradycardia  Other reaction(s): Bradycardia (disorder)  Other reaction(s): bradycardia  Other Reaction(s): Bradycardia (disorder)   Latex Dermatitis, Hives, Itching, Rash, Shortness Of Breath and Swelling    Other reaction(s): Shortness Of Breath  Rash, SOB - Shortness of breath, Dyspnea  Other reaction(s): Rash, Shortness Of Breath  Dyspnea  Other reaction(s): Shortness Of Breath  Rash, SOB - Shortness of breath, Dyspnea  Other reaction(s): Rash, Shortness Of Breath  Other reaction(s): Shortness Of Breath  Rash, SOB - Shortness of breath, Dyspnea  Dyspnea  Dyspnea    Other Reaction(s): Dermatitis    Other reaction(s): Shortness Of Breath Rash, SOB - Shortness of breath, Dyspnea Other reaction(s): Rash, Shortness Of Breath Other reaction(s): Shortness Of Breath Rash, SOB - Shortness of breath, Dyspnea  Other reaction(s): Shortness Of Breath Rash, SOB - Shortness of breath, Dyspnea Other reaction(s): Rash, Shortness Of Breath Other reaction(s): Shortness Of Breath Rash, SOB - Shortness of breath,  Dyspnea Dyspnea    Other reaction(s): Shortness Of Breath  Rash, SOB - Shortness of breath, Dyspnea  Other reaction(s): Rash, Shortness Of Breath  Dyspnea  Other reaction(s): Shortness Of Breath Rash, SOB - Shortness of breath, Dyspnea Other reaction(s): Rash, Shortness Of Breath Other reaction(s): Shortness Of Breath Rash, SOB - Shortness of breath, Dyspnea Dyspnea   Penicillin G Shortness Of Breath    Other reaction(s): Rash   Penicillins Rash, Hives, Itching, Other (See Comments) and Shortness Of Breath    dyspnea  Other reaction(s): Rash  dyspnea   Thorazine [Chlorpromazine Hcl] Anaphylaxis   Venlafaxine Rash    Other reaction(s): Other (See Comments)  Other reaction(s): Other (See Comments), Rash  severe hypertension-200's/100's  Other reaction(s): other  Other reaction(s): Hypertension, other  Other reaction(s): Other (See Comments)  Other reaction(s): Other (See Comments), Rash  severe hypertension-200's/100's  Other reaction(s): Other (See Comments)  severe hypertension-200's/100's    Other reaction(s): other  severe hypertension-200's/100's  severe hypertension-200's/100's    Other reaction(s): Other (See Comments) Other reaction(s): Other (See Comments), Rash severe hypertension-200's/100's Other reaction(s): Other (See Comments) severe hypertension-200's/100's  Other reaction(s): other  Other reaction(s): Hypertension, other Other reaction(s): Other (See Comments) Other reaction(s): Other (See Comments), Rash severe hypertension-200's/100's Other reaction(s): Other (See Comments) severe hypertension-200's/100's  Other reaction(s): other severe hypertension-200's/100's    Other reaction(s): Other (See Comments)  Other reaction(s): Other (See Comments), Rash  severe hypertension-200's/100's  Other reaction(s): other  Other reaction(s): Hypertension, other Other reaction(s): Other (See Comments) Other reaction(s): Other (See Comments), Rash severe hypertension-200's/100's  Other reaction(s): Other (See Comments) severe hypertension-200's/100's  Other reaction(s): other severe hypertension-200's/100's   Banana     Other reaction(s): Migraine (disorder)  Other reaction(s): Migraine (disorder)  Other reaction(s): Migraine (disorder)  Other reaction(s): Migraine (disorder)    Other reaction(s):  Migraine (disorder) Other reaction(s): Migraine (disorder)   Banana Extract Allergy Skin Test    Effexor [Venlafaxine Hydrochloride] Hypertension   Kiwi Extract     Other reaction(s): Migraine (disorder)  Other Reaction(s): Not available  Other reaction(s): Migraine   Clonazepam Anxiety    Other reaction(s): Anxiety    Consent Signed: Yes.    Is patient diabetic? Yes.    CBG today? .  Pregnant: No. LMP: No LMP recorded. (Menstrual status: IUD). (age 20-55)  Anticoagulants: no Anti-inflammatory: no Antibiotics: no  Procedure: Right L5-S1 Epidural Steroid Injection   Position: Prone Start Time: 10:24  End Time: 10:30AM  Fluoro Time: 48  RN/CMA Keyunna Coco RMA Yvanna Vidas RMA    Time 9:43 am 10:36    BP 180/96 188/99    Pulse 86 67    Respirations 16 16    O2 Sat 92 93    S/S 6 6    Pain Level 8/10 6/10     D/C home with Transportation, patient A & O X 3, D/C instructions reviewed, and sits independently.

## 2023-02-06 NOTE — Progress Notes (Signed)
 Right L5 -S1 Lumbar transforaminal epidural steroid injection under fluoroscopic guidance with contrast enhancement  Indication: Lumbosacral radiculitis is not relieved by medication management or other conservative care and interfering with self-care and mobility.   Informed consent was obtained after describing risk and benefits of the procedure with the patient, this includes bleeding, bruising, infection, paralysis and medication side effects.  The patient wishes to proceed and has given written consent.  Patient was placed in prone position.  The lumbar area was marked and prepped with Betadine.  It was entered with a 25-gauge 1-1/2 inch needle and one mL of 1% lidocaine  was injected into the skin and subcutaneous tissue.  Then a 22-gauge 7in spinal needle was inserted into the Right L5-S1 intervertebral foramen under AP, lateral, and oblique view.  Once needle tip was within the foramen on lateral views an dnor exceeding 6 o clock position on th epedical on AP viewed Isovue 200 was inected x 2ml Then a solution containing one mL of 10 mg per mL dexamethasone  and 2 mL of 1% lidocaine  was injected.  The patient tolerated procedure well.  Post procedure instructions were given.  Please see post procedure form.

## 2023-02-07 ENCOUNTER — Telehealth: Payer: Self-pay | Admitting: Registered Nurse

## 2023-02-07 MED ORDER — PREGABALIN 300 MG PO CAPS: 300.0000 mg | ORAL_CAPSULE | Freq: Two times a day (BID) | ORAL | 2 refills | Status: DC

## 2023-02-07 NOTE — Telephone Encounter (Signed)
 PMP was Reviewed.  Pregabalin  e-scribed to pharmacy. Shannon Meadows is aware via My-Chart message.

## 2023-02-12 ENCOUNTER — Telehealth: Payer: Self-pay | Admitting: Registered Nurse

## 2023-02-12 DIAGNOSIS — Z79891 Long term (current) use of opiate analgesic: Secondary | ICD-10-CM

## 2023-02-12 DIAGNOSIS — M7918 Myalgia, other site: Secondary | ICD-10-CM

## 2023-02-12 DIAGNOSIS — Z5181 Encounter for therapeutic drug level monitoring: Secondary | ICD-10-CM

## 2023-02-12 DIAGNOSIS — M170 Bilateral primary osteoarthritis of knee: Secondary | ICD-10-CM

## 2023-02-12 DIAGNOSIS — G894 Chronic pain syndrome: Secondary | ICD-10-CM

## 2023-02-12 MED ORDER — HYDROMORPHONE HCL 4 MG PO TABS: 4.0000 mg | ORAL_TABLET | ORAL | 0 refills | Status: DC | PRN

## 2023-02-12 NOTE — Telephone Encounter (Signed)
PMP was Reviewed.  Hydromorphone prescription sent to pharmacy. Shannon Meadows is aware via My-Chart.

## 2023-02-25 ENCOUNTER — Encounter: Payer: Self-pay | Admitting: Physical Medicine & Rehabilitation

## 2023-02-26 ENCOUNTER — Telehealth: Payer: Self-pay

## 2023-02-26 NOTE — Telephone Encounter (Signed)
Key: [id number]) Outcome Additional Information Required Available without authorization. Drug Etodolac 400MG  tablets [facility] Electronic [place] Form [phone] NCPDP)

## 2023-03-06 ENCOUNTER — Telehealth: Payer: Self-pay

## 2023-03-06 ENCOUNTER — Encounter: Payer: Self-pay | Admitting: Registered Nurse

## 2023-03-06 ENCOUNTER — Encounter: Payer: Medicaid Other | Attending: Registered Nurse | Admitting: Registered Nurse

## 2023-03-06 VITALS — BP 166/84 | HR 63 | Ht 69.0 in | Wt 382.0 lb

## 2023-03-06 DIAGNOSIS — M7918 Myalgia, other site: Secondary | ICD-10-CM | POA: Diagnosis present

## 2023-03-06 DIAGNOSIS — G894 Chronic pain syndrome: Secondary | ICD-10-CM | POA: Diagnosis not present

## 2023-03-06 DIAGNOSIS — M4727 Other spondylosis with radiculopathy, lumbosacral region: Secondary | ICD-10-CM | POA: Diagnosis not present

## 2023-03-06 DIAGNOSIS — I10 Essential (primary) hypertension: Secondary | ICD-10-CM | POA: Diagnosis present

## 2023-03-06 DIAGNOSIS — M797 Fibromyalgia: Secondary | ICD-10-CM

## 2023-03-06 DIAGNOSIS — M170 Bilateral primary osteoarthritis of knee: Secondary | ICD-10-CM | POA: Diagnosis present

## 2023-03-06 DIAGNOSIS — Z5181 Encounter for therapeutic drug level monitoring: Secondary | ICD-10-CM

## 2023-03-06 DIAGNOSIS — Z79891 Long term (current) use of opiate analgesic: Secondary | ICD-10-CM | POA: Diagnosis not present

## 2023-03-06 DIAGNOSIS — G43109 Migraine with aura, not intractable, without status migrainosus: Secondary | ICD-10-CM | POA: Diagnosis present

## 2023-03-06 MED ORDER — HYDROMORPHONE HCL 4 MG PO TABS: 4.0000 mg | ORAL_TABLET | ORAL | 0 refills | Status: DC | PRN

## 2023-03-06 MED ORDER — OXYCODONE-ACETAMINOPHEN 7.5-325 MG PO TABS: 1.0000 | ORAL_TABLET | Freq: Four times a day (QID) | ORAL | 0 refills | Status: DC | PRN

## 2023-03-06 NOTE — Telephone Encounter (Signed)
PA submitted for Hydromorphone   Marnee Guarneri (Key: [id number])

## 2023-03-06 NOTE — Progress Notes (Signed)
Subjective:    Patient ID: Shannon Meadows, female    DOB: Apr 14, 1970, 53 y.o.   MRN: [id number]  HPI: Shannon Meadows is a 53 y.o. female who returns for follow up appointment for chronic pain and medication refill. She states her pain is located in her bilateral hands with tingling and burning, lower back and bilateral knee pain. She also reports she has a headache today.She rates her pain 7. Her current exercise regime is walking and performing stretching exercises.  Shannon Meadows arrived to office with uncontrolled hypertension. She reports she is compliant with her anti-hypertensive medication, blood pressure was re-checked.   Shannon Meadows Morphine equivalent is 165.00 MME.   Oral Swab was Performed today.    Pain Inventory Average Pain 7 Pain Right Now 7 My pain is sharp, burning, dull, stabbing, tingling, and aching  In the last 24 hours, has pain interfered with the following? General activity 7 Relation with others 5 Enjoyment of life 5 What TIME of day is your pain at its worst? varies Sleep (in general) Fair  Pain is worse with: walking, bending, sitting, standing, and some activites Pain improves with: rest and medication Relief from Meds: 5  Family History  Problem Relation Age of Onset   Hyperlipidemia Father    Hypertension Father    Arthritis Father    Heart disease Father    Asthma Mother    Diabetes Mother    Arthritis Mother    Heart disease Mother    Heart disease Other    Lung disease Other    Diabetes Other    Hypertension Other    Social History   Socioeconomic History   Marital status: Significant Other    Spouse name: Not on file   Number of children: Not on file   Years of education: Not on file   Highest education level: Not on file  Occupational History   Not on file  Tobacco Use   Smoking status: Never   Smokeless tobacco: Never  Vaping Use   Vaping status: Never Used  Substance and Sexual Activity   Alcohol use: Not Currently   Drug use:  Never   Sexual activity: Not Currently  Other Topics Concern   Not on file  Social History Narrative   Not on file   Social Drivers of Health   Financial Resource Strain: Medium Risk (09/15/2019)   [facility]   Overall Financial Resource Strain (CARDIA)    Difficulty of Paying Living Expenses: Somewhat hard  Food Insecurity: High Risk (10/17/2022)   Received from [facility]   Hunger Vital Sign    Worried About Running Out of Food in the Last Year: Often true    Ran Out of Food in the Last Year: Sometimes true  Transportation Needs: Unmet Transportation Needs (10/17/2022)   Received from [facility]    In the past 12 months, has lack of reliable transportation kept you from medical appointments, meetings, work or from getting things needed for daily living? : Yes  Physical Activity: Insufficiently Active (09/15/2019)   [facility]   Exercise Vital Sign    Days of Exercise per Week: 3 days    Minutes of Exercise per Session: 20 min  Stress: No Stress Concern Present (09/15/2019)   [facility]   [employer] of Occupational Health - Occupational Stress Questionnaire    Feeling of Stress : Only a  little  Social Connections: Unknown (06/03/2021)   [facility]   Social Network    Social Network: Not on file   Past Surgical History:  Procedure Laterality Date   KNEE ARTHROSCOPY     LAPOROSCOPIC SURGERY     UTEREINE   ULNAR COLLATERAL LIGAMENT RECONSTRUCTION     Past Surgical History:  Procedure Laterality Date   KNEE ARTHROSCOPY     LAPOROSCOPIC SURGERY     UTEREINE   ULNAR COLLATERAL LIGAMENT RECONSTRUCTION     Past Medical History:  Diagnosis Date   Back pain    Cervicalgia    Chronic headaches    Chronic pain syndrome    Endometriosis    Facet syndrome, lumbar    Hyperlipidemia    Hypertension    Lumbar sprain     Migraines    Myofascial pain    Neck pain    TMJ syndrome    There were no vitals taken for this visit.  Opioid Risk Score:   Fall Risk Score:  `1  Depression screen [facility] 2/9     02/06/2023    9:39 AM 01/12/2023    9:18 AM 11/13/2022    8:22 AM 05/26/2022    8:22 AM 03/29/2022    8:11 AM 02/01/2022    9:21 AM 12/07/2021    8:26 AM  Depression screen PHQ 2/9  Decreased Interest 0 0 0 1 0 0 0  Down, Depressed, Hopeless 2 0 0 1 0 0 0  PHQ - 2 Score 2 0 0 2 0 0 0  Altered sleeping 1        Tired, decreased energy 1        Change in appetite 3        Feeling bad or failure about yourself  1        Trouble concentrating 0        Moving slowly or fidgety/restless 0        Suicidal thoughts 0        PHQ-9 Score 8        Difficult doing work/chores Not difficult at all          Review of Systems  Musculoskeletal:  Positive for back pain, gait problem and neck pain.  Neurological:  Positive for headaches.  All other systems reviewed and are negative.     Objective:   Physical Exam Vitals and nursing note reviewed.  Constitutional:      Appearance: Normal appearance. She is obese.  Neck:     Comments: Cervical Paraspinal Tenderness: C-5-C-6 Cardiovascular:     Rate and Rhythm: Normal rate and regular rhythm.     Pulses: Normal pulses.     Heart sounds: Normal heart sounds.  Pulmonary:     Effort: Pulmonary effort is normal.     Breath sounds: Normal breath sounds.  Musculoskeletal:     Comments: Normal Muscle Bulk and Muscle Testing Reveals:  Upper Extremities: Full ROM and Muscle Strength 5/5 Bilateral AC Joint Tenderness  Lumbar Paraspinal Tenderness: L-4-L-5  Lower Extremities: Decreased ROM and Muscle Strength 5/5 Arises from Table Slowly Antalgic  Gait     Skin:    General: Skin is warm and dry.  Neurological:     Mental Status: She is alert and oriented to person, place, and time.  Psychiatric:        Mood and Affect: Mood normal.        Behavior: Behavior  normal.  Assessment & Plan:  1.  History of chronic migraines with aura and cervicalgia with myofascial pain.Refilled: Dilaudid 4mg  1- tablet 4 hour as needed for severe pain. #12.    Continue  Aimovig. 03/06/2023 2. 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 S/P Lumbar ESI with Dr Wynn Banker on 02/06/2023 with 80% of relief noted. Continue with HEP as tolerated. and Heat therapy. 03/06/2023 Refilled: Oxycodone 7.5 mg one tablet every 6 hours as needed #120.We will continue the opioid monitoring program, this consists of regular clinic visits, examinations, urine drug screen, pill counts as well as use of [place] Controlled Substance Reporting system. A 12 month History has been reviewed on the [place] Controlled Substance Reporting System on 03/06/2023. Second script sent for the following month.  3.  Primary Bilateral Osteoarthritis of the left knee greater than right with meniscal. Chondromalacia patellae changes.Continue to monitor. Continue HEP as Tolerated. Continue to Monitor. 03/06/2023. 4. Left elbow pain:.No complaints today. Ortho Following. S/P surgery on  01/27/2019. Continue to monitor. 03/06/2023. 5. Chronic Bilateral Shoulder pain: No complaints today. Continue HEP as Tolerated. Continue to Monitor.03/06/2023 6. Sacral Pain: No complaints today. Continue HEP as Tolerated. Continue to Monitor. Continue current medication regimen. 03/06/2023 7. Cervicalgia: . Continue HEP as tolerated. Continue to monitor. 03/06/2023 8. Chronic Pain syndrome: Continue etodolac. In past she was prescribed Diclofenac, Voltaren gel and Ibuprofen, all ineffective. 03/06/2023 9. Uncontrolled Hypertension: Blood Pressure was Re-check. Ms. Hoskinson reports she is compliant with her anti-hypertensive medication. She will F/U with her PCP.   F/U in 2 months

## 2023-03-07 NOTE — Telephone Encounter (Signed)
Good Morning,  Shannon Meadows medication was denied and this is what they said:   [facility] reviewed your HYDROMORPHONE 4 MG TABLET request for the above-identified member, and it is denied for the following reason: because we did not see certain details about your illness and treatment. We see that this request is for a drug called hydromorphone for your illness (chronic pain syndrome). We may consider continued approval of this drug when we see certain records (documentation as to why you need continued opioid treatment and a current plan of care). We did not see such records (documentation may include, but is not limited to, chart notes, prescription claims records, prescription receipts, and laboratory data). We based this decision on your health plan's prior authorization criteria named Opioid Analgesics.

## 2023-03-11 LAB — DRUG TOX MONITOR 1 W/CONF, ORAL FLD
Alprazolam: NEGATIVE ng/mL (ref <0.50)
Amphetamines: NEGATIVE ng/mL (ref <10)
Barbiturates: NEGATIVE ng/mL (ref <10)
Benzodiazepines: NEGATIVE ng/mL (ref <0.50)
Buprenorphine: NEGATIVE ng/mL (ref <0.10)
Chlordiazepoxide: NEGATIVE ng/mL (ref <0.50)
Clonazepam: NEGATIVE ng/mL (ref <0.50)
Cocaine: NEGATIVE ng/mL (ref <5.0)
Codeine: NEGATIVE ng/mL (ref <2.5)
Diazepam: NEGATIVE ng/mL (ref <0.50)
Dihydrocodeine: NEGATIVE ng/mL (ref <2.5)
Fentanyl: NEGATIVE ng/mL (ref <0.10)
Flunitrazepam: NEGATIVE ng/mL (ref <0.50)
Flurazepam: NEGATIVE ng/mL (ref <0.50)
Heroin Metabolite: NEGATIVE ng/mL (ref <1.0)
Hydrocodone: NEGATIVE ng/mL (ref <2.5)
Hydromorphone: NEGATIVE ng/mL (ref <2.5)
Lorazepam: NEGATIVE ng/mL (ref <0.50)
MARIJUANA: NEGATIVE ng/mL (ref <2.5)
MDMA: NEGATIVE ng/mL (ref <10)
Meprobamate: NEGATIVE ng/mL (ref <2.5)
Methadone: NEGATIVE ng/mL (ref <5.0)
Midazolam: NEGATIVE ng/mL (ref <0.50)
Morphine: NEGATIVE ng/mL (ref <2.5)
Nicotine Metabolite: NEGATIVE ng/mL (ref <5.0)
Nordiazepam: NEGATIVE ng/mL (ref <0.50)
Norhydrocodone: NEGATIVE ng/mL (ref <2.5)
Noroxycodone: 52.3 ng/mL — ABNORMAL HIGH (ref <2.5)
Opiates: POSITIVE ng/mL — AB (ref <2.5)
Oxazepam: NEGATIVE ng/mL (ref <0.50)
Oxycodone: >250 ng/mL — ABNORMAL HIGH (ref <2.5)
Oxymorphone: 7.6 ng/mL — ABNORMAL HIGH (ref <2.5)
Phencyclidine: NEGATIVE ng/mL (ref <10)
Tapentadol: NEGATIVE ng/mL (ref <5.0)
Temazepam: NEGATIVE ng/mL (ref <0.50)
Tramadol: NEGATIVE ng/mL (ref <5.0)
Triazolam: NEGATIVE ng/mL (ref <0.50)
Zolpidem: NEGATIVE ng/mL (ref <5.0)

## 2023-03-11 LAB — DRUG TOX ALC METAB W/CON, ORAL FLD: Alcohol Metabolite: NEGATIVE ng/mL (ref <25)

## 2023-03-15 ENCOUNTER — Other Ambulatory Visit: Payer: Self-pay | Admitting: Registered Nurse

## 2023-03-15 DIAGNOSIS — G894 Chronic pain syndrome: Secondary | ICD-10-CM

## 2023-03-15 DIAGNOSIS — M170 Bilateral primary osteoarthritis of knee: Secondary | ICD-10-CM

## 2023-03-15 DIAGNOSIS — M7918 Myalgia, other site: Secondary | ICD-10-CM

## 2023-03-15 DIAGNOSIS — M797 Fibromyalgia: Secondary | ICD-10-CM

## 2023-03-16 ENCOUNTER — Telehealth: Payer: Self-pay | Admitting: Registered Nurse

## 2023-03-16 DIAGNOSIS — G894 Chronic pain syndrome: Secondary | ICD-10-CM

## 2023-03-16 DIAGNOSIS — M7918 Myalgia, other site: Secondary | ICD-10-CM

## 2023-03-16 DIAGNOSIS — M170 Bilateral primary osteoarthritis of knee: Secondary | ICD-10-CM

## 2023-03-16 DIAGNOSIS — M797 Fibromyalgia: Secondary | ICD-10-CM

## 2023-03-16 MED ORDER — TIZANIDINE HCL 4 MG PO TABS: 4.0000 mg | ORAL_TABLET | Freq: Three times a day (TID) | ORAL | 5 refills | Status: DC | PRN

## 2023-03-16 NOTE — Telephone Encounter (Signed)
PA submitted for Hydromorphone  Shannon Meadows (Key: [id number])

## 2023-03-16 NOTE — Telephone Encounter (Signed)
Tizanidine prescription sent to pharmacy.  Shannon Meadows is aware via My-Chart

## 2023-03-19 ENCOUNTER — Telehealth: Payer: Self-pay

## 2023-03-19 NOTE — Telephone Encounter (Signed)
 HYDROMORPHONE 4 MG TABLET: quantity/billable units of 12 approved for 03/16/2023- 09/12/2023.  Fax received.

## 2023-03-24 IMAGING — MR MR LUMBAR SPINE W/O CM
4 of 5 series · 24 of 48 positions shown · non-contrast
Comparison: Radiographs of the lumbar spine 08/27/2017. Lumbar
spine MRI 09/19/2010.

CLINICAL DATA: Spondylosis of lumbar region without myelopathy or
radiculopathy GUX.PML ([id number]-CM). Low back pain, greater than 6
weeks. Additional history provided by scanning technologist: Patient
reports low back pain radiating down bilateral hips to bilateral
legs and SI joint pain, symptoms for months, but increasing in the
past 2 weeks.

EXAM:
MRI LUMBAR SPINE WITHOUT CONTRAST
TECHNIQUE: Multiplanar, multisequence MR imaging of the lumbar spine was
performed. No intravenous contrast was administered.

[Series 3: T2 · sagittal · 4.0mm · 0.57mm/px · 6 of 17 slices shown (1 of 2)]
[im 1/17]
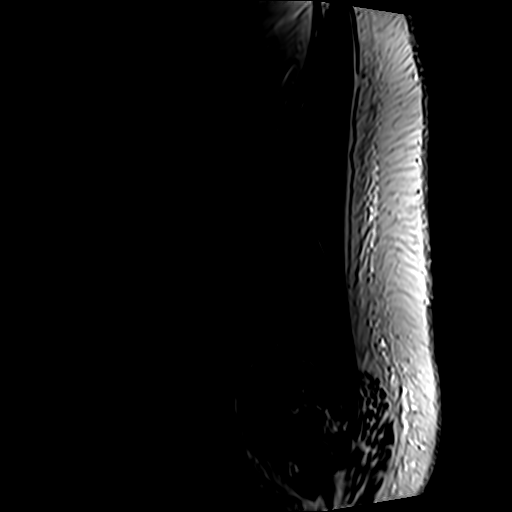
[im 4/17]
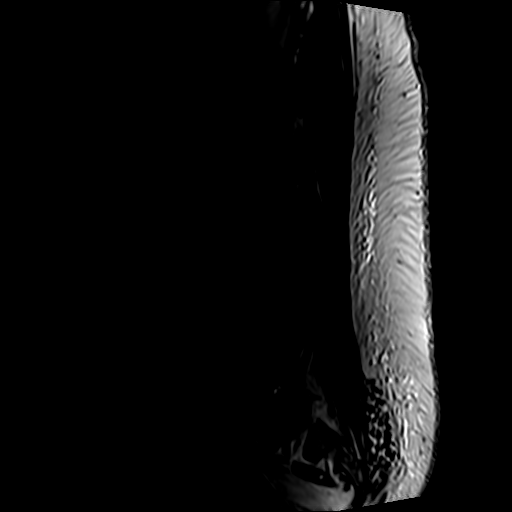
[im 7/17]
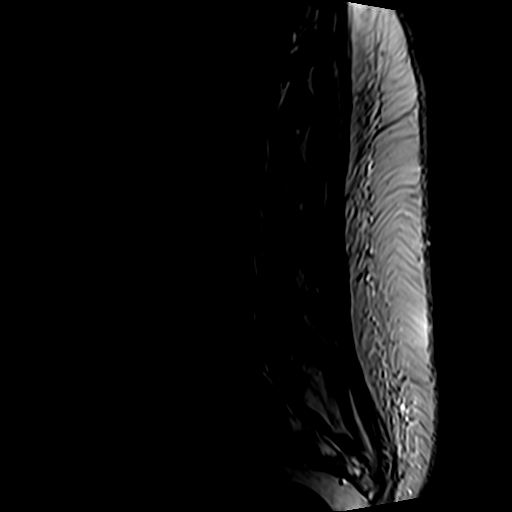
[im 10/17]
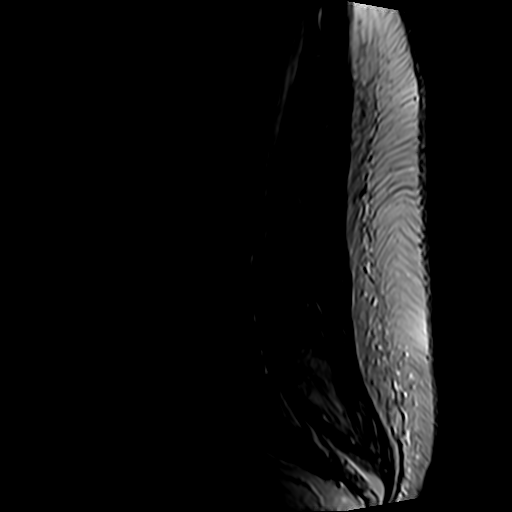
[im 13/17]
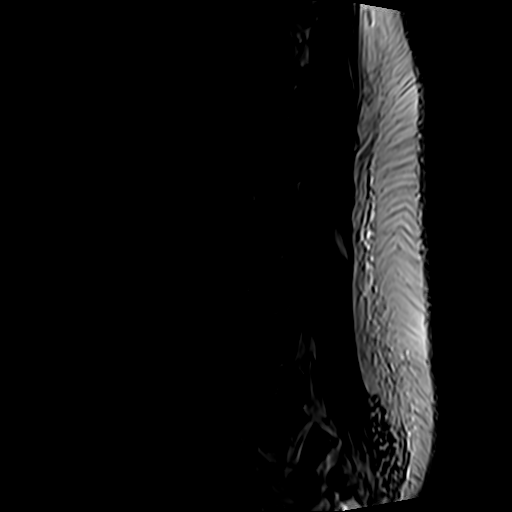
[im 17/17]
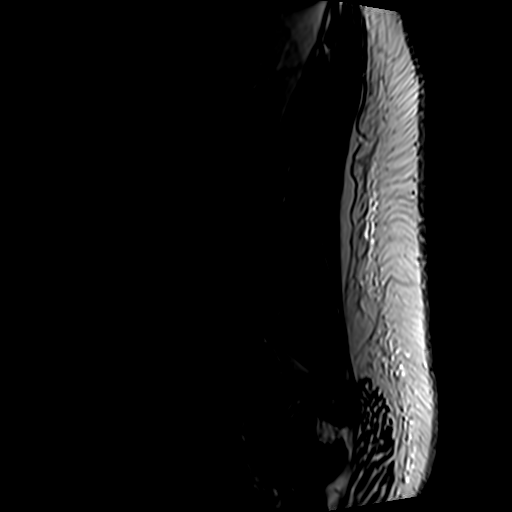

[Series 5: T1 · sagittal · 4.0mm · 0.57mm/px · 6 of 17 slices shown (1 of 2)]
[im 1/17]
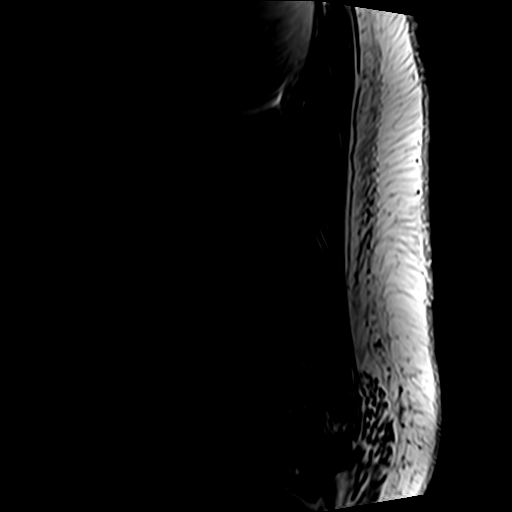
[im 4/17]
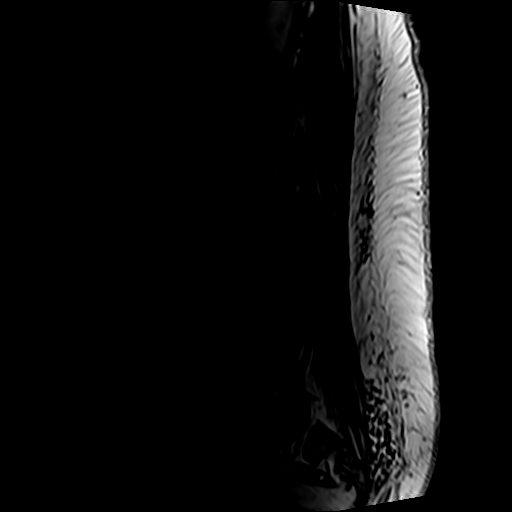
[im 7/17]
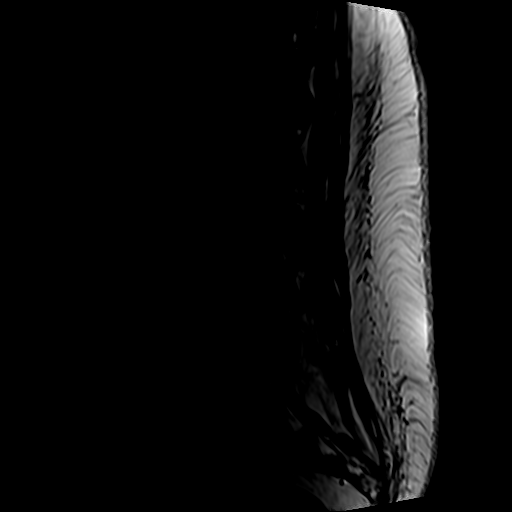
[im 10/17]
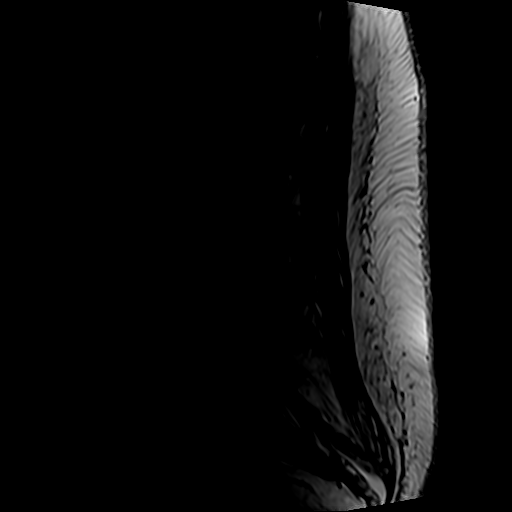
[im 13/17]
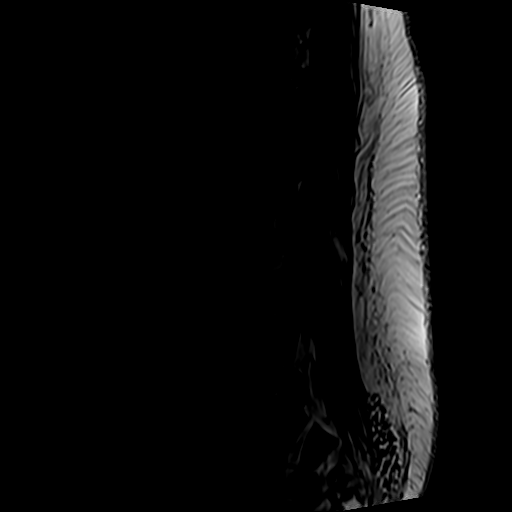
[im 17/17]
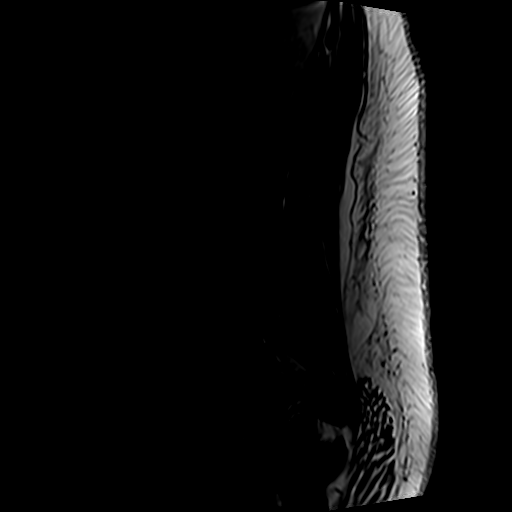

[Series 6: T1 · axial · 4.0mm · 0.35mm/px · z∈[-19,+140]mm · 3 of 39 slices shown (2 of 2)]
[im 6/39]
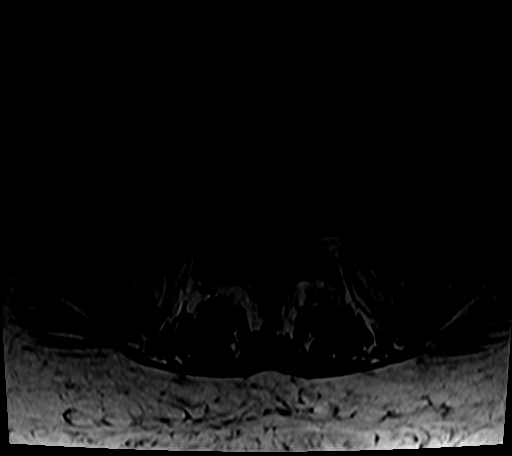
[im 20/39]
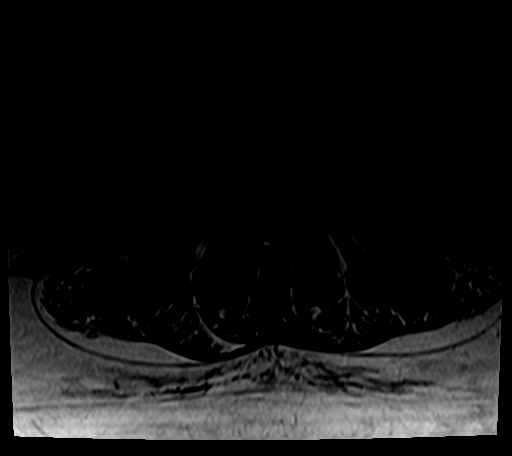
[im 33/39]
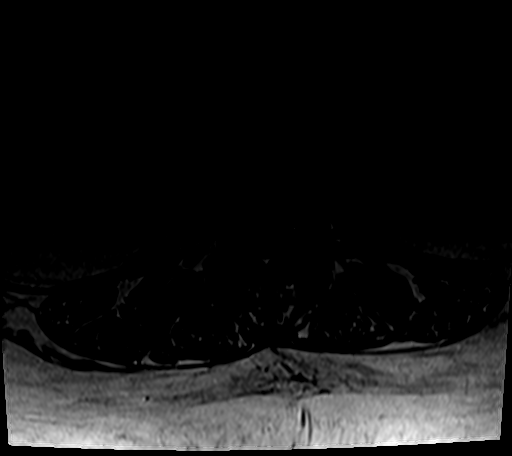

[Series 7: T2 · axial · 4.0mm · 0.70mm/px · z∈[-50,+172]mm · 9 of 39 slices shown (2 of 2)]
[im 1/39]
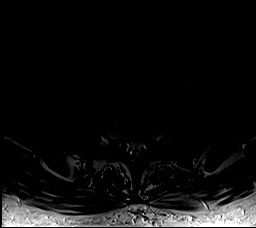
[im 6/39]
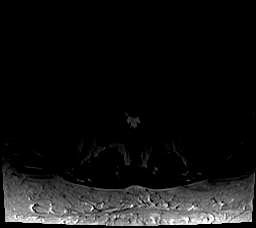
[im 11/39]
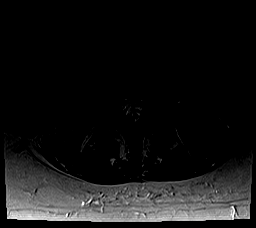
[im 17/39]
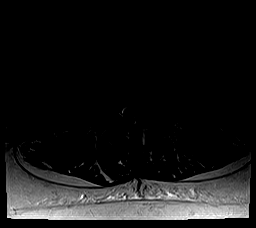
[im 20/39]
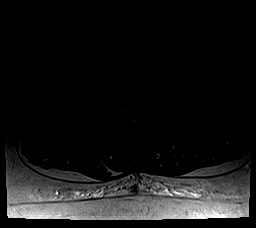
[im 22/39]
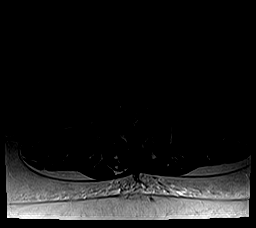
[im 28/39]
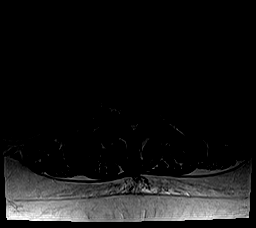
[im 33/39]
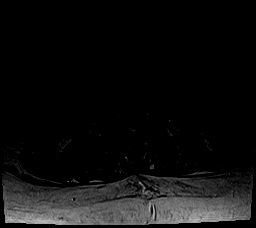
[im 39/39]
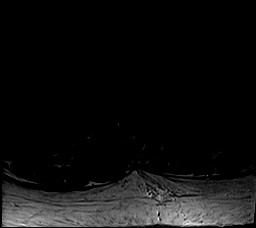

[24 of 48 positions shown; findings below may reference images not displayed]

FINDINGS: Segmentation: Transitional lumbosacral anatomy is identified. For
the purposes of this dictation, the lowest well-formed
intervertebral disc space is designated L5-S1. A and assimilation
joint is present on the left at L5-S1.

Alignment: Straightening of the expected lumbar lordosis. Lumbar
levocurvature. No significant spondylolisthesis.

Vertebrae: Vertebral body height is maintained. Multilevel
degenerative endplate irregularity, greatest at T11-T12 and L4-L5.
Trace degenerative endplate edema at L4-L5. Mild marrow edema along
the left L5-S1 assimilation joint. Multilevel vertebral body
hemangiomas. Multilevel ventrolateral osteophytes.

Conus medullaris and cauda equina: Conus extends to the L1-L2 level.
No signal abnormality within the visualized distal spinal cord.
Redemonstrated thin fibrolipoma of the filum terminale measuring
less than 2 mm in width.

Paraspinal and other soft tissues: No abnormality identified within
included portions of the abdomen/retroperitoneum. Paraspinal soft
tissues unremarkable.

Disc levels:

Unless otherwise stated, the level by level findings below have not
significantly changed from the prior MRI of 09/19/2010.

Progressive multilevel disc degeneration. Most notably, there is
progressive moderate disc degeneration at L4-L5.

T11-T12: Small disc bulge. Minimal partial effacement of ventral
thecal sac without spinal cord mass effect. No significant foraminal
stenosis.

T12-L1: Small disc bulge. Mild facet arthrosis/ligamentum flavum
hypertrophy. Minimal partial effacement of the ventral thecal sac
without spinal cord mass effect. No significant foraminal stenosis.

L1-L2: Mild facet arthrosis/ligamentum flavum hypertrophy. No
significant disc herniation or stenosis.

L2-L3: Disc bulge, new from the prior exam. Mild facet
arthrosis/ligamentum flavum hypertrophy, progressed. No significant
spinal canal or foraminal stenosis.

L3-L4: Small disc bulge. Mild facet arthrosis/ligamentum flavum
hypertrophy. No significant spinal canal or foraminal stenosis.

L4-L5: Disc bulge with endplate spurring, progressed. New
superimposed broad-based disc protrusion spanning the central and
bilateral subarticular zones. Moderate facet arthrosis with
ligamentum flavum hypertrophy, progressed. Progressive bilateral
subarticular stenosis with encroachment upon the bilateral
descending L5 nerve roots (series 7, image 31). Progressive moderate
central canal stenosis. Progressive mild/moderate bilateral neural
foraminal narrowing

L5-S1: Mild facet arthrosis. No significant disc herniation or
stenosis.
IMPRESSION: Transitional lumbosacral anatomy with spinal numbering, as
described.

Lumbar spondylosis, as outlined and having progressed at several
levels since the prior lumbar spine MRI of 09/19/2010. Findings are
most notably as follows.

At L4-L5, there is progressive moderate disc degeneration.
Progressive multifactorial bilateral subarticular stenosis with
encroachment upon the bilateral descending L5 nerve roots.
Progressive moderate central canal stenosis. Progressive
mild/moderate bilateral neural foraminal narrowing.

Lumbar levocurvature.

## 2023-04-12 ENCOUNTER — Other Ambulatory Visit: Payer: Self-pay | Admitting: Registered Nurse

## 2023-05-03 NOTE — Progress Notes (Unsigned)
 Subjective:    Patient ID: Shannon Meadows, female    DOB: 09/11/1970, 53 y.o.   MRN: [id number]  HPI: Shannon Meadows is a 53 y.o. female who returns for follow up appointment for chronic pain and medication refill. She states her pain is located in her neck and lower back pain. Shannon Meadows reports increase intensity of lower back pain, some days she is not receiving relief with her current medication regimen. She also reports she has a migraine , onset was on 05/03/2023, she states she takes her medication as prescribed. She rates her pain 6. Her current exercise regime is walking and performing stretching exercises.  Shannon Meadows Morphine equivalent is 53.00 MME.   Last Oral Swab was Performed on 03/06/2023, it was consistent.      Pain Inventory Average Pain 8 Pain Right Now 6 My pain is intermittent, constant, sharp, burning, dull, stabbing, tingling, and aching  In the last 24 hours, has pain interfered with the following? General activity 7 Relation with others 7 Enjoyment of life 8 What TIME of day is your pain at its worst? daytime Sleep (in general) Poor  Pain is worse with: walking, bending, sitting, inactivity, standing, and some activites Pain improves with: rest, heat/ice, pacing activities, medication, TENS, and injections Relief from Meds: 6  Family History  Problem Relation Age of Onset   Hyperlipidemia Father    Hypertension Father    Arthritis Father    Heart disease Father    Asthma Mother    Diabetes Mother    Arthritis Mother    Heart disease Mother    Heart disease Other    Lung disease Other    Diabetes Other    Hypertension Other    Social History   Socioeconomic History   Marital status: Significant Other    Spouse name: Not on file   Number of children: Not on file   Years of education: Not on file   Highest education level: Not on file  Occupational History   Not on file  Tobacco Use   Smoking status: Never   Smokeless tobacco: Never  Vaping Use    Vaping status: Never Used  Substance and Sexual Activity   Alcohol use: Not Currently   Drug use: Never   Sexual activity: Not Currently  Other Topics Concern   Not on file  Social History Narrative   Not on file   Social Drivers of Health   Financial Resource Strain: Medium Risk (09/15/2019)   [facility]   Overall Financial Resource Strain (CARDIA)    Difficulty of Paying Living Expenses: Somewhat hard  Food Insecurity: High Risk (10/17/2022)   Received from [facility]   Hunger Vital Sign    Worried About Running Out of Food in the Last Year: Often true    Ran Out of Food in the Last Year: Sometimes true  Transportation Needs: Unmet Transportation Needs (10/17/2022)   Received from [facility]    In the past 12 months, has lack of reliable transportation kept you from medical appointments, meetings, work or from getting things needed for daily living? : Yes  Physical Activity: Insufficiently Active (09/15/2019)   [facility]   Exercise Vital Sign    Days of Exercise per Week: 3 days    Minutes of Exercise per Session: 20 min  Stress: No Stress Concern Present (09/15/2019)   [facility]  [employer] of Occupational Health - Occupational Stress Questionnaire    Feeling of Stress : Only a little  Social Connections: Unknown (06/03/2021)   [facility], [facility]   Social Network    Social Network: Not on file   Past Surgical History:  Procedure Laterality Date   KNEE ARTHROSCOPY     LAPOROSCOPIC SURGERY     UTEREINE   ULNAR COLLATERAL LIGAMENT RECONSTRUCTION     Past Surgical History:  Procedure Laterality Date   KNEE ARTHROSCOPY     LAPOROSCOPIC SURGERY     UTEREINE   ULNAR COLLATERAL LIGAMENT RECONSTRUCTION     Past Medical History:  Diagnosis Date   Back pain    Cervicalgia    Chronic headaches    Chronic pain syndrome     Endometriosis    Facet syndrome, lumbar    Hyperlipidemia    Hypertension    Lumbar sprain    Migraines    Myofascial pain    Neck pain    TMJ syndrome    BP 136/80   Pulse 80   Ht 5\' 9"  (1.753 m)   Wt (!) 382 lb (173.3 kg)   SpO2 94%   BMI 56.41 kg/m   Opioid Risk Score:   Fall Risk Score:  `1  Depression screen [facility] 2/9     02/06/2023    9:39 AM 01/12/2023    9:18 AM 11/13/2022    8:22 AM 05/26/2022    8:22 AM 03/29/2022    8:11 AM 02/01/2022    9:21 AM 12/07/2021    8:26 AM  Depression screen PHQ 2/9  Decreased Interest 0 0 0 1 0 0 0  Down, Depressed, Hopeless 2 0 0 1 0 0 0  PHQ - 2 Score 2 0 0 2 0 0 0  Altered sleeping 1        Tired, decreased energy 1        Change in appetite 3        Feeling bad or failure about yourself  1        Trouble concentrating 0        Moving slowly or fidgety/restless 0        Suicidal thoughts 0        PHQ-9 Score 8        Difficult doing work/chores Not difficult at all          Review of Systems  Musculoskeletal:  Positive for back pain, gait problem and myalgias.  All other systems reviewed and are negative.      Objective:   Physical Exam Vitals and nursing note reviewed.  Constitutional:      Appearance: Normal appearance. She is obese.  Neck:     Comments: Cervical Paraspinal Tenderness: C-2- C-6 Cardiovascular:     Pulses: Normal pulses.  Musculoskeletal:     Comments: Normal Muscle Bulk and Muscle Testing Reveals:  Upper Extremities: Full ROM and Muscle Strength  5/5 Thoracic Paraspinal Tenderness : T-7-T-10 Lumbar Paraspinal Tenderness: L-4-L-5 Lower Extremities : Decreased ROM and Muscle Strength 5/5 Antalgic Gait     Skin:    General: Skin is warm and dry.  Neurological:     Mental Status: She is alert and oriented to person, place, and time.  Psychiatric:        Mood and Affect: Mood normal.        Behavior: Behavior normal.         Assessment & Plan:  1.  History of chronic migraines with  aura and cervicalgia with myofascial pain.Refilled: Dilaudid 4mg  1- tablet 4 hour as needed for severe pain. #12.    Continue  Aimovig. 05/04/2023 2. 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 S/P Lumbar ESI with Dr Wynn Banker on 02/06/2023 with 80% of relief noted. Continue with HEP as tolerated. and Heat therapy. 05/04/2023 Refilled: Increased: Oxycodone 7.5 mg one tablet every 6 hours as needed #135.We will continue the opioid monitoring program, this consists of regular clinic visits, examinations, urine drug screen, pill counts as well as use of [place] Controlled Substance Reporting system. A 12 month History has been reviewed on the [place] Controlled Substance Reporting System on 05/04/2023. Second script sent for the following month.  3.  Primary Bilateral Osteoarthritis of the left knee greater than right with meniscal. Chondromalacia patellae changes.Continue to monitor. Continue HEP as Tolerated. Continue to Monitor. 05/04/2023. 4. Left elbow pain:.No complaints today. Ortho Following. S/P surgery on  01/27/2019. Continue to monitor. 05/04/2023. 5. Chronic Bilateral Shoulder pain: No complaints today. Continue HEP as Tolerated. Continue to Monitor.05/04/2023 6. Sacral Pain: No complaints today. Continue HEP as Tolerated. Continue to Monitor. Continue current medication regimen. 05/04/2023 7. Cervicalgia: . Continue HEP as tolerated. Continue to monitor. 05/04/2023 8. Chronic Pain syndrome: Continue etodolac. In past she was prescribed Diclofenac, Voltaren gel and Ibuprofen, all ineffective. 05/04/2023 F/U in 2 months

## 2023-05-04 ENCOUNTER — Encounter: Payer: Self-pay | Admitting: Registered Nurse

## 2023-05-04 ENCOUNTER — Encounter: Payer: Medicaid Other | Attending: Registered Nurse | Admitting: Registered Nurse

## 2023-05-04 VITALS — BP 136/80 | HR 80 | Ht 69.0 in | Wt 382.0 lb

## 2023-05-04 DIAGNOSIS — Z5181 Encounter for therapeutic drug level monitoring: Secondary | ICD-10-CM | POA: Insufficient documentation

## 2023-05-04 DIAGNOSIS — M4727 Other spondylosis with radiculopathy, lumbosacral region: Secondary | ICD-10-CM | POA: Diagnosis not present

## 2023-05-04 DIAGNOSIS — M542 Cervicalgia: Secondary | ICD-10-CM | POA: Diagnosis present

## 2023-05-04 DIAGNOSIS — Z79891 Long term (current) use of opiate analgesic: Secondary | ICD-10-CM | POA: Diagnosis present

## 2023-05-04 DIAGNOSIS — M7918 Myalgia, other site: Secondary | ICD-10-CM | POA: Diagnosis not present

## 2023-05-04 DIAGNOSIS — G43109 Migraine with aura, not intractable, without status migrainosus: Secondary | ICD-10-CM | POA: Diagnosis not present

## 2023-05-04 DIAGNOSIS — G894 Chronic pain syndrome: Secondary | ICD-10-CM | POA: Insufficient documentation

## 2023-05-04 DIAGNOSIS — M170 Bilateral primary osteoarthritis of knee: Secondary | ICD-10-CM | POA: Insufficient documentation

## 2023-05-04 DIAGNOSIS — M797 Fibromyalgia: Secondary | ICD-10-CM | POA: Diagnosis present

## 2023-05-04 MED ORDER — OXYCODONE-ACETAMINOPHEN 7.5-325 MG PO TABS: 1.0000 | ORAL_TABLET | Freq: Every day | ORAL | 0 refills | Status: DC | PRN

## 2023-05-04 MED ORDER — HYDROMORPHONE HCL 4 MG PO TABS: 4.0000 mg | ORAL_TABLET | ORAL | 0 refills | Status: DC | PRN

## 2023-05-07 ENCOUNTER — Telehealth: Payer: Self-pay | Admitting: *Deleted

## 2023-05-07 ENCOUNTER — Encounter: Payer: Self-pay | Admitting: *Deleted

## 2023-05-07 NOTE — Telephone Encounter (Signed)
 Prior auth initiated for Oxycodone with [facility] via CoverMyMeds.  Salley Crawford (KeyDarlina Einstein)

## 2023-05-07 NOTE — Telephone Encounter (Signed)
 Outcome Approved today by [facility]  Medicaid [place] Case: [id number], Status: Approved, Coverage Starts on: 05/07/2023 12:00:00 AM, Coverage Ends on: 11/03/2023 12:00:00 AM. Effective Date: 05/07/2023 Authorization Expiration Date: 11/03/2023  Haskell Linker notified.

## 2023-05-13 ENCOUNTER — Other Ambulatory Visit: Payer: Self-pay | Admitting: Registered Nurse

## 2023-05-14 ENCOUNTER — Telehealth: Payer: Self-pay | Admitting: Registered Nurse

## 2023-05-14 MED ORDER — PREGABALIN 300 MG PO CAPS: 300.0000 mg | ORAL_CAPSULE | Freq: Two times a day (BID) | ORAL | 2 refills | Status: DC

## 2023-05-14 NOTE — Telephone Encounter (Signed)
PMP was Reviewed.  Pregabalin e-scribed to pharmacy.

## 2023-06-29 ENCOUNTER — Encounter: Attending: Registered Nurse | Admitting: Registered Nurse

## 2023-06-29 ENCOUNTER — Encounter: Payer: Self-pay | Admitting: Registered Nurse

## 2023-06-29 VITALS — BP 129/84 | HR 78 | Ht 69.0 in | Wt >= 6400 oz

## 2023-06-29 DIAGNOSIS — M797 Fibromyalgia: Secondary | ICD-10-CM

## 2023-06-29 DIAGNOSIS — G43109 Migraine with aura, not intractable, without status migrainosus: Secondary | ICD-10-CM | POA: Diagnosis present

## 2023-06-29 DIAGNOSIS — M4727 Other spondylosis with radiculopathy, lumbosacral region: Secondary | ICD-10-CM

## 2023-06-29 DIAGNOSIS — Z79891 Long term (current) use of opiate analgesic: Secondary | ICD-10-CM

## 2023-06-29 DIAGNOSIS — G894 Chronic pain syndrome: Secondary | ICD-10-CM

## 2023-06-29 DIAGNOSIS — M7918 Myalgia, other site: Secondary | ICD-10-CM

## 2023-06-29 DIAGNOSIS — Z5181 Encounter for therapeutic drug level monitoring: Secondary | ICD-10-CM

## 2023-06-29 DIAGNOSIS — M170 Bilateral primary osteoarthritis of knee: Secondary | ICD-10-CM | POA: Diagnosis present

## 2023-06-29 MED ORDER — HYDROMORPHONE HCL 4 MG PO TABS
4.0000 mg | ORAL_TABLET | ORAL | 0 refills | Status: DC | PRN
Start: 2023-06-29 — End: 2023-08-29

## 2023-06-29 MED ORDER — OXYCODONE-ACETAMINOPHEN 7.5-325 MG PO TABS: 1.0000 | ORAL_TABLET | Freq: Every day | ORAL | 0 refills | Status: DC | PRN

## 2023-06-29 MED ORDER — HYDROMORPHONE HCL 4 MG PO TABS: 4.0000 mg | ORAL_TABLET | ORAL | 0 refills | Status: DC | PRN

## 2023-06-29 NOTE — Progress Notes (Signed)
 Subjective:    Patient ID: Shannon Meadows, female    DOB: 04-18-1970, 53 y.o.   MRN: [id number]  HPI: Shannon Meadows is a 53 y.o. female who returns for follow up appointment for chronic pain and medication refill. She states her pain is located in her lower back and bilateral knees, she also reports she has a migraine. She rates her pain 8. Her current exercise regime is walking and performing stretching exercises.  Shannon Meadows Morphine equivalent is 165.00 MME.   Oral Swab was Performed today.     Pain Inventory Average Pain 8 Pain Right Now 8 My pain is intermittent, constant, sharp, burning, dull, stabbing, tingling, and aching  In the last 24 hours, has pain interfered with the following? General activity 8 Relation with others 8 Enjoyment of life 8 What TIME of day is your pain at its worst? morning , daytime, evening, and varies Sleep (in general) Poor  Pain is worse with: walking, bending, sitting, inactivity, standing, and some activites Pain improves with: rest, heat/ice, therapy/exercise, pacing activities, medication, and injections Relief from Meds: 5  Family History  Problem Relation Age of Onset   Hyperlipidemia Father    Hypertension Father    Arthritis Father    Heart disease Father    Asthma Mother    Diabetes Mother    Arthritis Mother    Heart disease Mother    Heart disease Other    Lung disease Other    Diabetes Other    Hypertension Other    Social History   Socioeconomic History   Marital status: Significant Other    Spouse name: Not on file   Number of children: Not on file   Years of education: Not on file   Highest education level: Not on file  Occupational History   Not on file  Tobacco Use   Smoking status: Never   Smokeless tobacco: Never  Vaping Use   Vaping status: Never Used  Substance and Sexual Activity   Alcohol  use: Not Currently   Drug use: Never   Sexual activity: Not Currently  Other Topics Concern   Not on file  Social  History Narrative   Not on file   Social Drivers of Health   Financial Resource Strain: Medium Risk (09/15/2019)   [facility]   Overall Financial Resource Strain (CARDIA)    Difficulty of Paying Living Expenses: Somewhat hard  Food Insecurity: High Risk (10/17/2022)   Received from [facility]   Hunger Vital Sign    Worried About Running Out of Food in the Last Year: Often true    Ran Out of Food in the Last Year: Sometimes true  Transportation Needs: Unmet Transportation Needs (10/17/2022)   Received from [facility]    In the past 12 months, has lack of reliable transportation kept you from medical appointments, meetings, work or from getting things needed for daily living? : Yes  Physical Activity: Insufficiently Active (09/15/2019)   [facility]   Exercise Vital Sign    Days of Exercise per Week: 3 days    Minutes of Exercise per Session: 20 min  Stress: No Stress Concern Present (09/15/2019)   [facility]   [employer] of Occupational Health - Occupational Stress Questionnaire    Feeling of Stress : Only a little  Social Connections: Unknown (06/03/2021)   Received [facility], [facility]  Social Network    Social Network: Not on file   Past Surgical History:  Procedure Laterality Date   KNEE ARTHROSCOPY     LAPOROSCOPIC SURGERY     UTEREINE   ULNAR COLLATERAL LIGAMENT RECONSTRUCTION     Past Surgical History:  Procedure Laterality Date   KNEE ARTHROSCOPY     LAPOROSCOPIC SURGERY     UTEREINE   ULNAR COLLATERAL LIGAMENT RECONSTRUCTION     Past Medical History:  Diagnosis Date   Back pain    Cervicalgia    Chronic headaches    Chronic pain syndrome    Endometriosis    Facet syndrome, lumbar    Hyperlipidemia    Hypertension    Lumbar sprain    Migraines    Myofascial pain    Neck pain    TMJ syndrome    BP 129/84 (BP Location:  Left Wrist, Patient Position: Sitting)   Pulse 78   Ht 5' 9 (1.753 m)   Wt (!) 400 lb (181.4 kg)   SpO2 93%   BMI 59.07 kg/m   Opioid Risk Score:   Fall Risk Score:  `1  Depression screen [facility] 2/9     06/29/2023    9:32 AM 02/06/2023    9:39 AM 01/12/2023    9:18 AM 11/13/2022    8:22 AM 05/26/2022    8:22 AM 03/29/2022    8:11 AM 02/01/2022    9:21 AM  Depression screen PHQ 2/9  Decreased Interest 0 0 0 0 1 0 0  Down, Depressed, Hopeless 0 2 0 0 1 0 0  PHQ - 2 Score 0 2 0 0 2 0 0  Altered sleeping 0 1       Tired, decreased energy 0 1       Change in appetite 0 3       Feeling bad or failure about yourself  0 1       Trouble concentrating 0 0       Moving slowly or fidgety/restless 0 0       Suicidal thoughts 0 0       PHQ-9 Score 0 8       Difficult doing work/chores Not difficult at all Not difficult at all         Review of Systems  All other systems reviewed and are negative.      Objective:   Physical Exam Vitals and nursing note reviewed.  Constitutional:      Appearance: Normal appearance. She is obese.   Cardiovascular:     Rate and Rhythm: Normal rate and regular rhythm.     Pulses: Normal pulses.     Heart sounds: Normal heart sounds.  Pulmonary:     Effort: Pulmonary effort is normal.     Breath sounds: Normal breath sounds.   Musculoskeletal:     Comments: Normal Muscle Bulk and Muscle Testing Reveals:  Upper Extremities: Full ROM and Muscle Strength 5/5 Bilateral AC Joint Tenderness Lumbar Paraspinal Tenderness: L-4-L-5 Lower Extremities: Decreased ROM and Muscle Strength 5/5 Arises from Table Slowly  Antalgic  Gait      Skin:    General: Skin is warm and dry.   Neurological:     Mental Status: She is alert and oriented to person, place, and time.   Psychiatric:        Mood and Affect: Mood normal.        Behavior: Behavior normal.         Assessment &  Plan:  1.  History of chronic migraines with aura and cervicalgia with  myofascial pain.Refilled: Dilaudid  4mg  1- tablet 4 hour as needed for severe pain. #12.    Continue  Aimovig . 06/29/2023 2. 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 S/P Lumbar ESI with Shannon Meadows on 02/06/2023 with 80% of relief noted. Continue with HEP as tolerated. and Heat therapy. 06/29/2023 Refilled: Oxycodone  7.5 mg one tablet every 6 hours as needed #135.We will continue the opioid monitoring program, this consists of regular clinic visits, examinations, urine drug screen, pill counts as well as use of [place]  Controlled Substance Reporting system. A 12 month History has been reviewed on the [place]  Controlled Substance Reporting System on 05/04/2023. Second script sent for the following month.  3.  Primary Bilateral Osteoarthritis of the left knee greater than right with meniscal. Chondromalacia patellae changes.Continue to monitor. Continue HEP as Tolerated. Continue to Monitor. 06/29/2023. 4. Left elbow pain:.No complaints today. Ortho Following. S/P surgery on  01/27/2019. Continue to monitor. 06/29/2023. 5. Chronic Bilateral Shoulder pain: No complaints today. Continue HEP as Tolerated. Continue to Monitor. 06/29/2023 6. Sacral Pain: No complaints today. Continue HEP as Tolerated. Continue to Monitor. Continue current medication regimen. 06/29/2023 7. Cervicalgia: . Continue HEP as tolerated. Continue to monitor. 06/29/2023 8. Chronic Pain syndrome: Continue etodolac . In past she was prescribed Diclofenac, Voltaren gel and Ibuprofen, all ineffective. 06/29/2023 F/U in 2 months

## 2023-07-04 LAB — DRUG TOX MONITOR 1 W/CONF, ORAL FLD
Amphetamines: NEGATIVE ng/mL (ref <10)
Barbiturates: NEGATIVE ng/mL (ref <10)
Benzodiazepines: NEGATIVE ng/mL (ref <0.50)
Buprenorphine: NEGATIVE ng/mL (ref <0.10)
Cocaine: NEGATIVE ng/mL (ref <5.0)
Codeine: NEGATIVE ng/mL (ref <2.5)
Dihydrocodeine: NEGATIVE ng/mL (ref <2.5)
Fentanyl: NEGATIVE ng/mL (ref <0.10)
Heroin Metabolite: NEGATIVE ng/mL (ref <1.0)
Hydrocodone: NEGATIVE ng/mL (ref <2.5)
Hydromorphone: NEGATIVE ng/mL (ref <2.5)
MARIJUANA: NEGATIVE ng/mL (ref <2.5)
MDMA: NEGATIVE ng/mL (ref <10)
Meprobamate: NEGATIVE ng/mL (ref <2.5)
Methadone: NEGATIVE ng/mL (ref <5.0)
Morphine: NEGATIVE ng/mL (ref <2.5)
Nicotine Metabolite: NEGATIVE ng/mL (ref <5.0)
Norhydrocodone: NEGATIVE ng/mL (ref <2.5)
Noroxycodone: 9.2 ng/mL — ABNORMAL HIGH (ref <2.5)
Opiates: POSITIVE ng/mL — AB (ref <2.5)
Oxycodone: 84.3 ng/mL — ABNORMAL HIGH (ref <2.5)
Oxymorphone: NEGATIVE ng/mL (ref <2.5)
Phencyclidine: NEGATIVE ng/mL (ref <10)
Tapentadol: NEGATIVE ng/mL (ref <5.0)
Tramadol: NEGATIVE ng/mL (ref <5.0)
Zolpidem: NEGATIVE ng/mL (ref <5.0)

## 2023-07-04 LAB — DRUG TOX ALC METAB W/CON, ORAL FLD: Alcohol Metabolite: NEGATIVE ng/mL (ref <25)

## 2023-07-17 ENCOUNTER — Telehealth: Payer: Self-pay

## 2023-07-17 NOTE — Telephone Encounter (Signed)
 Spoke to [facility] [employer] [facility] and pharmacy should be running Hydromorphone  4 mg 12 tablets for a 30 day supply. Pharmacy has been notified.

## 2023-07-17 NOTE — Telephone Encounter (Signed)
 Per pharmacy a PA is needed for the Hydromorphone . It looks like is approved until Aug 2025. When its ran the pharmacy said it needs a PA.

## 2023-07-21 ENCOUNTER — Other Ambulatory Visit: Payer: Self-pay | Admitting: Registered Nurse

## 2023-07-31 ENCOUNTER — Telehealth: Payer: Self-pay

## 2023-07-31 NOTE — Telephone Encounter (Signed)
 Patient calling into the office to report that [facility] needed additional information before proceeding with filling the rx for Oxycodone .  Called [facility] and spoke with Kayla, Pharmacist and gave appropriate diagnosis codes and made aware that the prior authorization is valid 4/14-10/11/25.  They are filling medication for this patient and stated that diagnosis codes need to be listed on future prescriptions to prevent any delays with filling the medication moving forward.

## 2023-08-23 ENCOUNTER — Other Ambulatory Visit: Payer: Self-pay | Admitting: Registered Nurse

## 2023-08-23 NOTE — Telephone Encounter (Signed)
 Patient calling into the office requesting a refill on Pregablin (Lyrica ).  Patient states Fidela typically refills the rx however, she is out of the office and will send the request to one of our Providers to review.

## 2023-08-29 ENCOUNTER — Encounter: Attending: Registered Nurse | Admitting: Registered Nurse

## 2023-08-29 VITALS — BP 154/95 | HR 107 | Temp 98.1°F | Ht 69.0 in | Wt >= 6400 oz

## 2023-08-29 DIAGNOSIS — Z5181 Encounter for therapeutic drug level monitoring: Secondary | ICD-10-CM | POA: Insufficient documentation

## 2023-08-29 DIAGNOSIS — M5412 Radiculopathy, cervical region: Secondary | ICD-10-CM | POA: Insufficient documentation

## 2023-08-29 DIAGNOSIS — G894 Chronic pain syndrome: Secondary | ICD-10-CM | POA: Insufficient documentation

## 2023-08-29 DIAGNOSIS — M797 Fibromyalgia: Secondary | ICD-10-CM | POA: Insufficient documentation

## 2023-08-29 DIAGNOSIS — M542 Cervicalgia: Secondary | ICD-10-CM | POA: Diagnosis not present

## 2023-08-29 DIAGNOSIS — G8929 Other chronic pain: Secondary | ICD-10-CM | POA: Diagnosis present

## 2023-08-29 DIAGNOSIS — M4727 Other spondylosis with radiculopathy, lumbosacral region: Secondary | ICD-10-CM | POA: Diagnosis present

## 2023-08-29 DIAGNOSIS — M546 Pain in thoracic spine: Secondary | ICD-10-CM | POA: Insufficient documentation

## 2023-08-29 DIAGNOSIS — M170 Bilateral primary osteoarthritis of knee: Secondary | ICD-10-CM | POA: Insufficient documentation

## 2023-08-29 DIAGNOSIS — Z79891 Long term (current) use of opiate analgesic: Secondary | ICD-10-CM | POA: Insufficient documentation

## 2023-08-29 DIAGNOSIS — M7918 Myalgia, other site: Secondary | ICD-10-CM | POA: Diagnosis present

## 2023-08-29 MED ORDER — HYDROMORPHONE HCL 4 MG PO TABS: 4.0000 mg | ORAL_TABLET | ORAL | 0 refills | Status: DC | PRN

## 2023-08-29 MED ORDER — OXYCODONE-ACETAMINOPHEN 7.5-325 MG PO TABS
1.0000 | ORAL_TABLET | Freq: Every day | ORAL | 0 refills | Status: DC | PRN
Start: 2023-08-29 — End: 2023-09-19

## 2023-08-29 MED ORDER — TIZANIDINE HCL 4 MG PO TABS
4.0000 mg | ORAL_TABLET | Freq: Three times a day (TID) | ORAL | 5 refills | Status: DC | PRN
Start: 2023-08-29 — End: 2023-09-26

## 2023-08-29 NOTE — Progress Notes (Signed)
 Subjective:    Patient ID: Shannon Meadows, female    DOB: 08/18/1970, 53 y.o.   MRN: [id number]  HPI: Shannon Meadows is a 53 y.o. female who returns for follow up appointment for chronic pain and medication refill. She states her pain is located in her neck radiating into her bilateral shoulders, upper- lower back and bilateral knee pain. .She also reports she has a migraine today, she is compliant with her medication. She rates her pain 8. Her current exercise regime is walking and performing stretching exercises.  [name] Morphine equivalent is 58.63 MME.   Last oral swab was performed on 06/29/2023, it was consistent.    Apical Pulse 100  Pain Inventory Average Pain 8 Pain Right Now 8 My pain is intermittent, constant, sharp, burning, dull, stabbing, tingling, and aching  In the last 24 hours, has pain interfered with the following? General activity 8 Relation with others 8 Enjoyment of life 8 What TIME of day is your pain at its worst? morning  Sleep (in general) Poor  Pain is worse with: walking, bending, sitting, inactivity, standing, and some activites Pain improves with: medication Relief from Meds: 4  Family History  Problem Relation Age of Onset   Hyperlipidemia Father    Hypertension Father    Arthritis Father    Heart disease Father    Asthma Mother    Diabetes Mother    Arthritis Mother    Heart disease Mother    Heart disease Other    Lung disease Other    Diabetes Other    Hypertension Other    Social History   Socioeconomic History   Marital status: Significant Other    Spouse name: Not on file   Number of children: Not on file   Years of education: Not on file   Highest education level: Not on file  Occupational History   Not on file  Tobacco Use   Smoking status: Never   Smokeless tobacco: Never  Vaping Use   Vaping status: Never Used  Substance and Sexual Activity   Alcohol  use: Not Currently   Drug use: Never   Sexual activity: Not  Currently  Other Topics Concern   Not on file  Social History Narrative   Not on file   Social Drivers of Health   Financial Resource Strain: Medium Risk (09/15/2019)   Received from [facility]   Overall Financial Resource Strain (CARDIA)    Difficulty of Paying Living Expenses: Somewhat hard  Food Insecurity: High Risk (10/17/2022)   Received from [facility]   Hunger Vital Sign    Within the past 12 months, you worried that your food would run out before you got money to buy more: Often true    Within the past 12 months, the food you bought just didn't last and you didn't have money to get more. : Sometimes true  Transportation Needs: Unmet Transportation Needs (10/17/2022)   Received from [facility]    In the past 12 months, has lack of reliable transportation kept you from medical appointments, meetings, work or from getting things needed for daily living? : Yes  Physical Activity: Insufficiently Active (09/15/2019)   Received from [facility]   Exercise Vital Sign    On average, how many days per week do you engage in moderate to strenuous exercise (like a brisk walk)?: 3 days    On average, how many minutes do you engage in exercise at this level?:  20 min  Stress: No Stress Concern Present (09/15/2019)   Received from [facility] of Occupational Health - Occupational Stress Questionnaire    Feeling of Stress : Only a little  Social Connections: Unknown (06/03/2021)   Received from [facility]   Social Network    Social Network: Not on file   Past Surgical History:  Procedure Laterality Date   KNEE ARTHROSCOPY     LAPOROSCOPIC SURGERY     UTEREINE   ULNAR COLLATERAL LIGAMENT RECONSTRUCTION     Past Surgical History:  Procedure Laterality Date   KNEE ARTHROSCOPY     LAPOROSCOPIC SURGERY     UTEREINE   ULNAR COLLATERAL LIGAMENT RECONSTRUCTION     Past Medical History:  Diagnosis Date   Back pain    Cervicalgia     Chronic headaches    Chronic pain syndrome    Endometriosis    Facet syndrome, lumbar    Hyperlipidemia    Hypertension    Lumbar sprain    Migraines    Myofascial pain    Neck pain    TMJ syndrome    There were no vitals taken for this visit.  Opioid Risk Score:   Fall Risk Score:  `1  Depression screen [facility] 2/9     06/29/2023    9:32 AM 02/06/2023    9:39 AM 01/12/2023    9:18 AM 11/13/2022    8:22 AM 05/26/2022    8:22 AM 03/29/2022    8:11 AM 02/01/2022    9:21 AM  Depression screen PHQ 2/9  Decreased Interest 0 0 0 0 1 0 0  Down, Depressed, Hopeless 0 2 0 0 1 0 0  PHQ - 2 Score 0 2 0 0 2 0 0  Altered sleeping 0 1       Tired, decreased energy 0 1       Change in appetite 0 3       Feeling bad or failure about yourself  0 1       Trouble concentrating 0 0       Moving slowly or fidgety/restless 0 0       Suicidal thoughts 0 0       PHQ-9 Score 0 8       Difficult doing work/chores Not difficult at all Not difficult at all         Review of Systems  Musculoskeletal:  Positive for back pain.       Bilateral knee pain  Neurological:  Positive for headaches.       Objective:   Physical Exam Vitals and nursing note reviewed.  Constitutional:      Appearance: Normal appearance. She is obese.  Neck:     Comments: Cervical Paraspinal Tenderness: C-5-C-6 Cardiovascular:     Rate and Rhythm: Normal rate and regular rhythm.     Pulses: Normal pulses.     Heart sounds: Normal heart sounds.  Pulmonary:     Effort: Pulmonary effort is normal.     Breath sounds: Normal breath sounds.  Musculoskeletal:     Comments: Normal Muscle Bulk and Muscle Testing Reveals:  Upper Extremities: Full ROM and Muscle Strength 5/5 Bilateral AC Joint Tenderness  Thoracic Paraspinal Tenderness: T-2-T-4  Lumbar Paraspinal Tenderness: L-4-L-5 Lower Extremities: Decreased ROM and Muscle Strength 5/5 Bilateral Lower Extremities Flexion Produces Pain into her Bilateral Patella's Arises from  Table slowly Antalgic  Gait     Skin:    General: Skin  is warm and dry.  Neurological:     Mental Status: She is alert and oriented to person, place, and time.  Psychiatric:        Mood and Affect: Mood normal.        Behavior: Behavior normal.          Assessment & Plan:  1.  History of chronic migraines with aura and cervicalgia with myofascial pain.Refilled: Dilaudid  4mg  1- tablet 4 hour as needed for severe pain. #12.    Continue  Aimovig . 08/29/2023 2. 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 S/P Lumbar ESI with Dr Carilyn on 02/06/2023 with 80% of relief noted. Schedule ESI with Dr Carilyn.  Continue with HEP as tolerated. and Heat therapy. 08/29/2023 Refilled: Oxycodone  7.5 mg one tablet every 6 hours as needed #135.We will continue the opioid monitoring program, this consists of regular clinic visits, examinations, urine drug screen, pill counts as well as use of [place]  Controlled Substance Reporting system. A 12 month History has been reviewed on the [place]  Controlled Substance Reporting System on 08/29/2023. Second script sent for the following month.  3.  Primary Bilateral Osteoarthritis of the left knee greater than right with meniscal. Chondromalacia patellae changes.Continue to monitor. Continue HEP as Tolerated. Continue to Monitor. 08/29/2023. 4. Left elbow pain:.No complaints today. Ortho Following. S/P surgery on  01/27/2019. Continue to monitor. 08/29/2023. 5. Chronic Bilateral Shoulder pain:  Continue HEP as Tolerated. Continue to Monitor. 08/29/2023 6. Sacral Pain: No complaints today. Continue HEP as Tolerated. Continue to Monitor. Continue current medication regimen. 08/29/2023 7. Cervicalgia:/ Cervical Radiculitis . Continue HEP as tolerated. Continue to monitor. 08/29/2023 8. Chronic Pain syndrome: Continue etodolac . In past she was prescribed Diclofenac, Voltaren gel and Ibuprofen, all ineffective. 08/29/2023 F/U in 2 months

## 2023-09-01 ENCOUNTER — Encounter: Payer: Self-pay | Admitting: Registered Nurse

## 2023-09-19 ENCOUNTER — Telehealth: Payer: Self-pay | Admitting: Registered Nurse

## 2023-09-19 DIAGNOSIS — Z79891 Long term (current) use of opiate analgesic: Secondary | ICD-10-CM

## 2023-09-19 DIAGNOSIS — G894 Chronic pain syndrome: Secondary | ICD-10-CM

## 2023-09-19 DIAGNOSIS — M7918 Myalgia, other site: Secondary | ICD-10-CM

## 2023-09-19 DIAGNOSIS — Z5181 Encounter for therapeutic drug level monitoring: Secondary | ICD-10-CM

## 2023-09-19 DIAGNOSIS — M170 Bilateral primary osteoarthritis of knee: Secondary | ICD-10-CM

## 2023-09-19 MED ORDER — PREGABALIN 300 MG PO CAPS: 300.0000 mg | ORAL_CAPSULE | Freq: Two times a day (BID) | ORAL | 2 refills | Status: DC

## 2023-09-19 MED ORDER — OXYCODONE-ACETAMINOPHEN 7.5-325 MG PO TABS: 1.0000 | ORAL_TABLET | Freq: Every day | ORAL | 0 refills | Status: DC | PRN

## 2023-09-19 MED ORDER — HYDROMORPHONE HCL 4 MG PO TABS: 4.0000 mg | ORAL_TABLET | ORAL | 0 refills | Status: DC | PRN

## 2023-09-19 NOTE — Telephone Encounter (Signed)
 PMP was Reviewed.  Hydromorphone , Oxycodone  and Pregabalin  e- scribed to pharmacy. Ragna is aware via My-Chart

## 2023-09-25 ENCOUNTER — Telehealth: Payer: Self-pay

## 2023-09-25 NOTE — Telephone Encounter (Signed)
(  Key: [id number]) PA Case ID #: [id number] Need Help? Call us  at [phone] Status sent iconSent to Plan today Drug HYDROmorphone  HCl 4MG  tablets ePA cloud logo Form [employer] [place]  Medicaid Electronic PA Form [phone] NCPDP)

## 2023-09-26 DIAGNOSIS — M797 Fibromyalgia: Secondary | ICD-10-CM

## 2023-09-26 DIAGNOSIS — G894 Chronic pain syndrome: Secondary | ICD-10-CM

## 2023-09-26 DIAGNOSIS — M170 Bilateral primary osteoarthritis of knee: Secondary | ICD-10-CM

## 2023-09-26 DIAGNOSIS — M7918 Myalgia, other site: Secondary | ICD-10-CM

## 2023-09-26 MED ORDER — ETODOLAC 400 MG PO TABS: 400.0000 mg | ORAL_TABLET | Freq: Two times a day (BID) | ORAL | 2 refills | Status: DC | PRN

## 2023-09-26 MED ORDER — TIZANIDINE HCL 4 MG PO TABS: 4.0000 mg | ORAL_TABLET | Freq: Three times a day (TID) | ORAL | 5 refills | Status: DC | PRN

## 2023-09-26 NOTE — Telephone Encounter (Signed)
 Approval rec'd from [employer] for hydromorphone  4 mg tablets, #12.   Valid from 09/25/2023 - 03/23/2024 Reference:  [id number] Approved J Code:  G1500 Copy of authorization sent to be scanned in chart

## 2023-09-27 ENCOUNTER — Telehealth: Payer: Self-pay

## 2023-09-27 NOTE — Telephone Encounter (Signed)
(  Key: [id number]) PA Case ID #: [id number] Status sent iconSent to Plan today Drug oxyCODONE -Acetaminophen  7.5-325MG  tablets ePA cloud logo Form [employer] [place]  Medicaid Electronic PA Form [phone] NCP

## 2023-09-28 ENCOUNTER — Other Ambulatory Visit: Payer: Self-pay | Admitting: Medical Genetics

## 2023-09-28 NOTE — Telephone Encounter (Signed)
 Approved on September 4 by [facility]  Medicaid [place] Case: [id number], Status: Approved, Coverage Starts on: 09/27/2023 12:00:00 AM, Coverage Ends on: 03/25/2024 12:00:00 AM. Effective Date: 09/27/2023

## 2023-10-04 ENCOUNTER — Ambulatory Visit: Payer: Self-pay | Admitting: Family

## 2023-10-19 ENCOUNTER — Encounter: Attending: Registered Nurse | Admitting: Physical Medicine & Rehabilitation

## 2023-10-24 ENCOUNTER — Encounter: Admitting: Family

## 2023-10-24 NOTE — Progress Notes (Signed)
   This encounter was created in error - please disregard. No show

## 2023-10-26 ENCOUNTER — Encounter: Attending: Registered Nurse | Admitting: Registered Nurse

## 2023-10-26 ENCOUNTER — Encounter: Payer: Self-pay | Admitting: Registered Nurse

## 2023-10-26 VITALS — BP 138/93 | HR 78 | Ht 69.0 in | Wt 398.2 lb

## 2023-10-26 DIAGNOSIS — G894 Chronic pain syndrome: Secondary | ICD-10-CM | POA: Diagnosis present

## 2023-10-26 DIAGNOSIS — Z5181 Encounter for therapeutic drug level monitoring: Secondary | ICD-10-CM | POA: Insufficient documentation

## 2023-10-26 DIAGNOSIS — M5412 Radiculopathy, cervical region: Secondary | ICD-10-CM | POA: Diagnosis not present

## 2023-10-26 DIAGNOSIS — M170 Bilateral primary osteoarthritis of knee: Secondary | ICD-10-CM | POA: Diagnosis present

## 2023-10-26 DIAGNOSIS — M7918 Myalgia, other site: Secondary | ICD-10-CM | POA: Insufficient documentation

## 2023-10-26 DIAGNOSIS — Z79891 Long term (current) use of opiate analgesic: Secondary | ICD-10-CM | POA: Diagnosis present

## 2023-10-26 DIAGNOSIS — M4727 Other spondylosis with radiculopathy, lumbosacral region: Secondary | ICD-10-CM | POA: Insufficient documentation

## 2023-10-26 DIAGNOSIS — G43109 Migraine with aura, not intractable, without status migrainosus: Secondary | ICD-10-CM | POA: Diagnosis present

## 2023-10-26 DIAGNOSIS — M546 Pain in thoracic spine: Secondary | ICD-10-CM | POA: Insufficient documentation

## 2023-10-26 DIAGNOSIS — G8929 Other chronic pain: Secondary | ICD-10-CM | POA: Diagnosis present

## 2023-10-26 DIAGNOSIS — M797 Fibromyalgia: Secondary | ICD-10-CM | POA: Diagnosis present

## 2023-10-26 DIAGNOSIS — M542 Cervicalgia: Secondary | ICD-10-CM | POA: Insufficient documentation

## 2023-10-26 MED ORDER — HYDROMORPHONE HCL 4 MG PO TABS: 4.0000 mg | ORAL_TABLET | ORAL | 0 refills | Status: DC | PRN

## 2023-10-26 MED ORDER — OXYCODONE-ACETAMINOPHEN 7.5-325 MG PO TABS: 1.0000 | ORAL_TABLET | Freq: Every day | ORAL | 0 refills | Status: DC | PRN

## 2023-10-26 NOTE — Progress Notes (Signed)
 Subjective:    Patient ID: Shannon Meadows, female    DOB: February 05, 1970, 53 y.o.   MRN: [id number]  HPI: Shannon Meadows is a 53 y.o. female who returns for follow up appointment for chronic pain and medication refill. She states her  pain is located in her neck radiating into her bilateral shoulders, upper - lower back pain radiating into her right lower extremity and bilateral knee pain. She rates her pain 9. Her current exercise regime is walking and performing stretching exercises.  Shannon Meadows Morphine equivalent is 130.63 MME.   Oral Swab was Performed today    Pain Inventory Average Pain 7 Pain Right Now 9 My pain is intermittent, constant, sharp, burning, dull, stabbing, tingling, and aching  In the last 24 hours, has pain interfered with the following? General activity 8 Relation with others 8 Enjoyment of life 8 What TIME of day is your pain at its worst? daytime Sleep (in general) Poor  Pain is worse with: inactivity and some activites Pain improves with: rest, heat/ice, pacing activities, medication, and injections Relief from Meds: 5  Family History  Problem Relation Age of Onset   Hyperlipidemia Father    Hypertension Father    Arthritis Father    Heart disease Father    Asthma Mother    Diabetes Mother    Arthritis Mother    Heart disease Mother    Heart disease Other    Lung disease Other    Diabetes Other    Hypertension Other    Social History   Socioeconomic History   Marital status: Significant Other    Spouse name: Not on file   Number of children: Not on file   Years of education: Not on file   Highest education level: Not on file  Occupational History   Not on file  Tobacco Use   Smoking status: Never   Smokeless tobacco: Never  Vaping Use   Vaping status: Never Used  Substance and Sexual Activity   Alcohol  use: Not Currently   Drug use: Never   Sexual activity: Not Currently  Other Topics Concern   Not on file  Social History Narrative    Not on file   Social Drivers of Health   Financial Resource Strain: Medium Risk (09/15/2019)   Received from [facility]   Overall Financial Resource Strain (CARDIA)    Difficulty of Paying Living Expenses: Somewhat hard  Food Insecurity: High Risk (10/17/2022)   Received from [facility]   Hunger Vital Sign    Within the past 12 months, you worried that your food would run out before you got money to buy more: Often true    Within the past 12 months, the food you bought just didn't last and you didn't have money to get more. : Sometimes true  Transportation Needs: Unmet Transportation Needs (10/17/2022)   Received from [facility]    In the past 12 months, has lack of reliable transportation kept you from medical appointments, meetings, work or from getting things needed for daily living? : Yes  Physical Activity: Insufficiently Active (09/15/2019)   Received from [facility]   Exercise Vital Sign    On average, how many days per week do you engage in moderate to strenuous exercise (like a brisk walk)?: 3 days    On average, how many minutes do you engage in exercise at this level?: 20 min  Stress: No Stress Concern Present (09/15/2019)   Received from  [facility]   [employer] of Occupational Health - Occupational Stress Questionnaire    Feeling of Stress : Only a little  Social Connections: Unknown (06/03/2021)   Received from [facility]   Social Network    Social Network: Not on file   Past Surgical History:  Procedure Laterality Date   KNEE ARTHROSCOPY     LAPOROSCOPIC SURGERY     UTEREINE   ULNAR COLLATERAL LIGAMENT RECONSTRUCTION     Past Surgical History:  Procedure Laterality Date   KNEE ARTHROSCOPY     LAPOROSCOPIC SURGERY     UTEREINE   ULNAR COLLATERAL LIGAMENT RECONSTRUCTION     Past Medical History:  Diagnosis Date   Back pain    Cervicalgia    Chronic headaches    Chronic pain syndrome    Endometriosis    Facet syndrome,  lumbar    Hyperlipidemia    Hypertension    Lumbar sprain    Migraines    Myofascial pain    Neck pain    TMJ syndrome    BP (!) 138/93 (BP Location: Left Arm, Patient Position: Sitting, Cuff Size: Large)   Pulse 78   Ht 5' 9 (1.753 m)   Wt (!) 398 lb 3.2 oz (180.6 kg)   SpO2 93%   BMI 58.80 kg/m   Opioid Risk Score:   Fall Risk Score:  `1  Depression screen [facility] 2/9     10/26/2023   10:24 AM 06/29/2023    9:32 AM 02/06/2023    9:39 AM 01/12/2023    9:18 AM 11/13/2022    8:22 AM 05/26/2022    8:22 AM 03/29/2022    8:11 AM  Depression screen PHQ 2/9  Decreased Interest 0 0 0 0 0 1 0  Down, Depressed, Hopeless 0 0 2 0 0 1 0  PHQ - 2 Score 0 0 2 0 0 2 0  Altered sleeping 0 0 1      Tired, decreased energy 0 0 1      Change in appetite 0 0 3      Feeling bad or failure about yourself  0 0 1      Trouble concentrating 0 0 0      Moving slowly or fidgety/restless 0 0 0      Suicidal thoughts 0 0 0      PHQ-9 Score 0 0 8      Difficult doing work/chores Very difficult Not difficult at all Not difficult at all          Review of Systems  Musculoskeletal:  Positive for arthralgias, back pain, joint swelling and myalgias.       Neck, back, bilateral knee pain  All other systems reviewed and are negative.      Objective:   Physical Exam Vitals and nursing note reviewed.  Constitutional:      Appearance: Normal appearance. She is obese.  Cardiovascular:     Rate and Rhythm: Normal rate and regular rhythm.     Pulses: Normal pulses.     Heart sounds: Normal heart sounds.  Pulmonary:     Effort: Pulmonary effort is normal.     Breath sounds: Normal breath sounds.  Musculoskeletal:     Cervical back: Normal range of motion and neck supple.     Comments: Normal Muscle Bulk and Muscle Testing Reveals:  Upper Extremities: Full ROM and Muscle Strength 5/5 Bilateral AC Joint Tenderness  Thoracic Paraspinal Tenderness: T-7-T-10 , Lumbar Paraspinal Tenderness:  L-4-L-5 Bilateral Greater Trochanter Tenderness Lower Extremities: Decreased ROM and Muscle Strength 5/5 Bilateral Lower Extremities Flexion Produces Pain into her Bilateral Lower Extremities Arises from Table slowly using walker for support Antalgic  Gait     Skin:    General: Skin is warm and dry.  Neurological:     Mental Status: She is alert and oriented to person, place, and time.  Psychiatric:        Mood and Affect: Mood normal.        Behavior: Behavior normal.          Assessment & Plan:  1.  History of chronic migraines with aura and cervicalgia with myofascial pain.Refilled: Dilaudid  4mg  1- tablet 4 hour as needed for severe pain. #12.    Continue  Aimovig . 10/26/2023 2. 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 Scheduled for  Lumbar ESI with Dr Carilyn, in the past she had Lumbar ESI on 02/06/2023 with 80% of relief noted. Schedule ESI with Dr Carilyn.  Continue with HEP as tolerated. and Heat therapy. 10/26/2023 Refilled: Oxycodone  7.5 mg one tablet every 6 hours as needed #135.We will continue the opioid monitoring program, this consists of regular clinic visits, examinations, urine drug screen, pill counts as well as use of [place]  Controlled Substance Reporting system. A 12 month History has been reviewed on the   Controlled Substance Reporting System on 10/26/2023. Second script sent for the following month.  3.  Primary Bilateral Osteoarthritis of the left knee greater than right with meniscal. Chondromalacia patellae changes.Continue to monitor. Continue HEP as Tolerated. Continue to Monitor. 10/26/2023. 4. Left elbow pain: No complaints today. Ortho Following. S/P surgery on  01/27/2019. Continue to monitor. 10/26/2023. 5. Chronic Bilateral Shoulder pain:  Continue HEP as Tolerated. Continue to Monitor. 10/26/2023 6. Sacral Pain: No complaints today. Continue HEP as Tolerated. Continue to Monitor. Continue current medication  regimen. 10/26/2023 7. Cervicalgia:/ Cervical Radiculitis . Continue HEP as tolerated. Continue to monitor. 10/26/2023 8. Chronic Pain syndrome: Continue etodolac . In past she was prescribed Diclofenac, Voltaren gel and Ibuprofen, all ineffective. 10/26/2023 F/U in 2 months

## 2023-10-31 LAB — DRUG TOX MONITOR 1 W/CONF, ORAL FLD
Amphetamines: NEGATIVE ng/mL (ref <10)
Barbiturates: NEGATIVE ng/mL (ref <10)
Benzodiazepines: NEGATIVE ng/mL (ref <0.50)
Buprenorphine: NEGATIVE ng/mL (ref <0.10)
Cocaine: NEGATIVE ng/mL (ref <5.0)
Codeine: NEGATIVE ng/mL (ref <2.5)
Dihydrocodeine: NEGATIVE ng/mL (ref <2.5)
Fentanyl: NEGATIVE ng/mL (ref <0.10)
Heroin Metabolite: NEGATIVE ng/mL (ref <1.0)
Hydrocodone: NEGATIVE ng/mL (ref <2.5)
Hydromorphone: NEGATIVE ng/mL (ref <2.5)
MARIJUANA: NEGATIVE ng/mL (ref <2.5)
MDMA: NEGATIVE ng/mL (ref <10)
Meprobamate: NEGATIVE ng/mL (ref <2.5)
Methadone: NEGATIVE ng/mL (ref <5.0)
Morphine: NEGATIVE ng/mL (ref <2.5)
Nicotine Metabolite: NEGATIVE ng/mL (ref <5.0)
Norhydrocodone: NEGATIVE ng/mL (ref <2.5)
Noroxycodone: 10.2 ng/mL — ABNORMAL HIGH (ref <2.5)
Opiates: POSITIVE ng/mL — AB (ref <2.5)
Oxycodone: 53.6 ng/mL — ABNORMAL HIGH (ref <2.5)
Oxymorphone: NEGATIVE ng/mL (ref <2.5)
Phencyclidine: NEGATIVE ng/mL (ref <10)
Tapentadol: NEGATIVE ng/mL (ref <5.0)
Tramadol: NEGATIVE ng/mL (ref <5.0)
Zolpidem: NEGATIVE ng/mL (ref <5.0)

## 2023-10-31 LAB — DRUG TOX ALC METAB W/CON, ORAL FLD: Alcohol Metabolite: NEGATIVE ng/mL (ref <25)

## 2023-11-21 ENCOUNTER — Encounter: Payer: Self-pay | Admitting: *Deleted

## 2023-11-21 NOTE — Progress Notes (Deleted)
 PROCEDURE RECORD [facility]   Name: Shannon Meadows DOB:December 29, 1970 MRN: [id number]  Date:11/21/2023  Physician: Prentice Compton, MD    Nurse/CMA: Breckan Cafiero RN  Allergies:  Allergies  Allergen Reactions   Chlorpromazine Anaphylaxis and Anxiety    Other reaction(s): Anaphylaxis   Clonidine     Other reaction(s): Other (See Comments) INTOLERANCE- bradycardia Other reaction(s): Bradycardia (disorder) Other reaction(s): bradycardia   Clonidine Derivatives Other (See Comments)    INTOLERANCE  Other Reaction(s): Other  Other Reaction(s): Bradycardia (disorder)    Other reaction(s): Other (See Comments) INTOLERANCE- bradycardia Other reaction(s): Bradycardia (disorder) Other reaction(s): bradycardia  Other reaction(s): bradycardia, Other (See Comments), Other (See Comments)  INTOLERANCE  Other reaction(s): Other (See Comments)  INTOLERANCE- bradycardia  Other reaction(s): Bradycardia (disorder)  Other reaction(s): bradycardia  Other Reaction(s): Bradycardia (disorder)   Latex Dermatitis, Hives, Itching, Rash, Shortness Of Breath and Swelling    Other reaction(s): Shortness Of Breath  Rash, SOB - Shortness of breath, Dyspnea  Other reaction(s): Rash, Shortness Of Breath  Dyspnea  Other reaction(s): Shortness Of Breath  Rash, SOB - Shortness of breath, Dyspnea  Other reaction(s): Rash, Shortness Of Breath  Other reaction(s): Shortness Of Breath  Rash, SOB - Shortness of breath, Dyspnea  Dyspnea  Dyspnea    Other Reaction(s): Dermatitis    Other reaction(s): Shortness Of Breath Rash, SOB - Shortness of breath, Dyspnea Other reaction(s): Rash, Shortness Of Breath Other reaction(s): Shortness Of Breath Rash, SOB - Shortness of breath, Dyspnea  Other reaction(s): Shortness Of Breath Rash, SOB - Shortness of breath, Dyspnea Other reaction(s): Rash, Shortness Of Breath Other reaction(s): Shortness Of Breath Rash, SOB - Shortness of  breath, Dyspnea Dyspnea    Other reaction(s): Shortness Of Breath  Rash, SOB - Shortness of breath, Dyspnea  Other reaction(s): Rash, Shortness Of Breath  Dyspnea  Other reaction(s): Shortness Of Breath Rash, SOB - Shortness of breath, Dyspnea Other reaction(s): Rash, Shortness Of Breath Other reaction(s): Shortness Of Breath Rash, SOB - Shortness of breath, Dyspnea Dyspnea   Penicillin G Shortness Of Breath    Other reaction(s): Rash   Penicillins Rash, Hives, Itching, Other (See Comments) and Shortness Of Breath    dyspnea  Other reaction(s): Rash  dyspnea   Thorazine [Chlorpromazine Hcl] Anaphylaxis   Venlafaxine Rash    Other reaction(s): Other (See Comments)  Other reaction(s): Other (See Comments), Rash  severe hypertension-200's/100's  Other reaction(s): other  Other reaction(s): Hypertension, other  Other reaction(s): Other (See Comments)  Other reaction(s): Other (See Comments), Rash  severe hypertension-200's/100's  Other reaction(s): Other (See Comments)  severe hypertension-200's/100's    Other reaction(s): other  severe hypertension-200's/100's  severe hypertension-200's/100's    Other reaction(s): Other (See Comments) Other reaction(s): Other (See Comments), Rash severe hypertension-200's/100's Other reaction(s): Other (See Comments) severe hypertension-200's/100's  Other reaction(s): other  Other reaction(s): Hypertension, other Other reaction(s): Other (See Comments) Other reaction(s): Other (See Comments), Rash severe hypertension-200's/100's Other reaction(s): Other (See Comments) severe hypertension-200's/100's  Other reaction(s): other severe hypertension-200's/100's    Other reaction(s): Other (See Comments)  Other reaction(s): Other (See Comments), Rash  severe hypertension-200's/100's  Other reaction(s): other  Other reaction(s): Hypertension, other Other reaction(s): Other (See Comments) Other reaction(s): Other (See Comments), Rash severe  hypertension-200's/100's Other reaction(s): Other (See Comments) severe hypertension-200's/100's  Other reaction(s): other severe hypertension-200's/100's   Banana     Other reaction(s): Migraine (disorder)  Other reaction(s): Migraine (disorder)  Other reaction(s): Migraine (disorder)  Other reaction(s): Migraine (disorder)    Other reaction(s):  Migraine (disorder) Other reaction(s): Migraine (disorder)   Banana Extract Allergy Skin Test    Effexor [Venlafaxine Hydrochloride] Hypertension   Kiwi Extract     Other reaction(s): Migraine (disorder)  Other Reaction(s): Not available  Other reaction(s): Migraine   Clonazepam Anxiety    Other reaction(s): Anxiety    Consent Signed: {yes wn:685467}  Is patient diabetic? {yes no:314532}  CBG today? ***  Pregnant: {yes no:314532} LMP: No LMP recorded. (Menstrual status: IUD). (age 47-55)  Anticoagulants: {Yes/No:19989} Anti-inflammatory: {Yes/No:19989} Antibiotics: {Yes/No:19989}  Procedure: L5-S1 Transforaminal Epidural Steroid Injection  Position: Prone Start Time: ***  End Time: ***  Fluoro Time: ***  RN/CMA [employer]    Time *** ***    BP *** ***    Pulse *** ***    Respirations *** ***    O2 Sat *** ***    S/S *** ***    Pain Level *** ***     D/C home with ***, patient A & O X 3, D/C instructions reviewed, and sits independently.

## 2023-11-22 ENCOUNTER — Encounter: Admitting: Physical Medicine & Rehabilitation

## 2023-11-26 ENCOUNTER — Telehealth: Payer: Self-pay

## 2023-11-26 NOTE — Telephone Encounter (Signed)
 Outcome Approved today by [facility]  Medicaid [place] Case: [id number], Status: Approved, Coverage Starts on: 11/26/2023 12:00:00 AM, Coverage Ends on: 05/24/2024 12:00:00 AM. Effective Date: 11/26/2023 Authorization Expiration Date: 05/24/2024

## 2023-11-26 NOTE — Telephone Encounter (Signed)
(  Key: [id number]) PA Case ID #: [id number] Rx #: [id number] Need Help? Call us  at [phone] Status sent iconSent to Plan today Drug oxyCODONE -Acetaminophen  7.5-325MG  tablets ePA cloud logo Form [facility] [facility] [place]  Medicaid Electronic [place] Form [phone] NCPDP)

## 2023-12-24 ENCOUNTER — Other Ambulatory Visit: Payer: Self-pay | Admitting: Registered Nurse

## 2023-12-24 ENCOUNTER — Encounter: Payer: Self-pay | Admitting: Registered Nurse

## 2023-12-24 ENCOUNTER — Encounter: Attending: Registered Nurse | Admitting: Registered Nurse

## 2023-12-24 VITALS — BP 153/90 | HR 94 | Ht 69.0 in | Wt >= 6400 oz

## 2023-12-24 DIAGNOSIS — M546 Pain in thoracic spine: Secondary | ICD-10-CM | POA: Diagnosis present

## 2023-12-24 DIAGNOSIS — M170 Bilateral primary osteoarthritis of knee: Secondary | ICD-10-CM | POA: Insufficient documentation

## 2023-12-24 DIAGNOSIS — G894 Chronic pain syndrome: Secondary | ICD-10-CM | POA: Diagnosis not present

## 2023-12-24 DIAGNOSIS — M797 Fibromyalgia: Secondary | ICD-10-CM | POA: Diagnosis present

## 2023-12-24 DIAGNOSIS — M542 Cervicalgia: Secondary | ICD-10-CM | POA: Diagnosis not present

## 2023-12-24 DIAGNOSIS — G8929 Other chronic pain: Secondary | ICD-10-CM | POA: Diagnosis present

## 2023-12-24 DIAGNOSIS — M5412 Radiculopathy, cervical region: Secondary | ICD-10-CM | POA: Diagnosis present

## 2023-12-24 DIAGNOSIS — I10 Essential (primary) hypertension: Secondary | ICD-10-CM | POA: Insufficient documentation

## 2023-12-24 DIAGNOSIS — M4727 Other spondylosis with radiculopathy, lumbosacral region: Secondary | ICD-10-CM | POA: Diagnosis present

## 2023-12-24 MED ORDER — OXYCODONE-ACETAMINOPHEN 7.5-325 MG PO TABS: 1.0000 | ORAL_TABLET | Freq: Every day | ORAL | 0 refills | Status: DC | PRN

## 2023-12-24 MED ORDER — HYDROMORPHONE HCL 4 MG PO TABS: 4.0000 mg | ORAL_TABLET | ORAL | 0 refills | Status: DC | PRN

## 2023-12-24 MED ORDER — PREGABALIN 300 MG PO CAPS: 300.0000 mg | ORAL_CAPSULE | Freq: Two times a day (BID) | ORAL | 2 refills | Status: AC

## 2023-12-24 MED ORDER — ETODOLAC 400 MG PO TABS: 400.0000 mg | ORAL_TABLET | Freq: Two times a day (BID) | ORAL | 2 refills | Status: DC | PRN

## 2023-12-24 NOTE — Progress Notes (Unsigned)
 Subjective:    Patient ID: Shannon Meadows, female    DOB: 1970-08-10, 53 y.o.   MRN: [id number]  HPI: Shannon Meadows is a 53 y.o. female who returns for follow up appointment for chronic pain and medication refill. states *** pain is located in  ***. rates pain ***. current exercise regime is walking and performing stretching exercises.  Shannon Meadows equivalent is *** MME.   Last Oral Swab was Performed on 10/26/2023, it was consistent with Oxycodone .    Pain Inventory Average Pain 8 Pain Right Now 8 My pain is intermittent, constant, sharp, burning, dull, stabbing, tingling, and aching  In the last 24 hours, has pain interfered with the following? General activity 8 Relation with others 8 Enjoyment of life 8 What TIME of day is your pain at its worst? daytime and evening Sleep (in general) Poor  Pain is worse with: walking, bending, sitting, inactivity, standing, and some activites Pain improves with: rest, pacing activities, medication, and injections Relief from Meds: 5  Family History  Problem Relation Age of Onset   Hyperlipidemia Father    Hypertension Father    Arthritis Father    Heart disease Father    Asthma Mother    Diabetes Mother    Arthritis Mother    Heart disease Mother    Heart disease Other    Lung disease Other    Diabetes Other    Hypertension Other    Social History   Socioeconomic History   Marital status: Significant Other    Spouse name: Not on file   Number of children: Not on file   Years of education: Not on file   Highest education level: Not on file  Occupational History   Not on file  Tobacco Use   Smoking status: Never   Smokeless tobacco: Never  Vaping Use   Vaping status: Never Used  Substance and Sexual Activity   Alcohol  use: Not Currently   Drug use: Never   Sexual activity: Not Currently  Other Topics Concern   Not on file  Social History Narrative   Not on file   Social Drivers of Health   Financial Resource  Strain: Medium Risk (09/15/2019)   Received from [facility]   Overall Financial Resource Strain (CARDIA)    Difficulty of Paying Living Expenses: Somewhat hard  Food Insecurity: High Risk (10/17/2022)   Received from [facility]   Hunger Vital Sign    Within the past 12 months, you worried that your food would run out before you got money to buy more: Often true    Within the past 12 months, the food you bought just didn't last and you didn't have money to get more. : Sometimes true  Transportation Needs: Unmet Transportation Needs (10/17/2022)   Received from [facility]    In the past 12 months, has lack of reliable transportation kept you from medical appointments, meetings, work or from getting things needed for daily living? : Yes  Physical Activity: Insufficiently Active (09/15/2019)   Received from [facility]   Exercise Vital Sign    On average, how many days per week do you engage in moderate to strenuous exercise (like a brisk walk)?: 3 days    On average, how many minutes do you engage in exercise at this level?: 20 min  Stress: No Stress Concern Present (09/15/2019)   Received from [facility] Occup[facility] - Occupational Stress Questionnaire  Feeling of Stress : Only a little  Social Connections: Unknown (06/03/2021)   [facility]   Social Network    Social Network: Not on file   Past Surgical History:  Procedure Laterality Date   KNEE ARTHROSCOPY     LAPOROSCOPIC SURGERY     UTEREINE   ULNAR COLLATERAL LIGAMENT RECONSTRUCTION     Past Surgical History:  Procedure Laterality Date   KNEE ARTHROSCOPY     LAPOROSCOPIC SURGERY     UTEREINE   ULNAR COLLATERAL LIGAMENT RECONSTRUCTION     Past Medical History:  Diagnosis Date   Back pain    Cervicalgia    Chronic headaches    Chronic pain syndrome    Endometriosis    Facet syndrome, lumbar    Hyperlipidemia    Hypertension    Lumbar sprain     Migraines    Myofascial pain    Neck pain    TMJ syndrome    BP (!) 169/98 (BP Location: Left Arm, Patient Position: Sitting, Cuff Size: Large)   Pulse 94   Ht 5' 9 (1.753 m)   Wt (!) 400 lb 9.6 oz (181.7 kg)   SpO2 91%   BMI 59.16 kg/m   Opioid Risk Score:   Fall Risk Score:  `1  Depression screen [facility] 2/9     10/26/2023   10:24 AM 06/29/2023    9:32 AM 02/06/2023    9:39 AM 01/12/2023    9:18 AM 11/13/2022    8:22 AM 05/26/2022    8:22 AM 03/29/2022    8:11 AM  Depression screen PHQ 2/9  Decreased Interest 0 0 0 0 0 1 0  Down, Depressed, Hopeless 0 0 2 0 0 1 0  PHQ - 2 Score 0 0 2 0 0 2 0  Altered sleeping 0 0 1      Tired, decreased energy 0 0 1      Change in appetite 0 0 3      Feeling bad or failure about yourself  0 0 1      Trouble concentrating 0 0 0      Moving slowly or fidgety/restless 0 0 0      Suicidal thoughts 0 0 0      PHQ-9 Score 0  0  8       Difficult doing work/chores Very difficult Not difficult at all Not difficult at all         Data saved with a previous flowsheet row definition       Review of Systems  Musculoskeletal:  Positive for back pain, myalgias and neck pain.  All other systems reviewed and are negative.      Objective:   Physical Exam        Assessment & Plan:  1.  History of chronic migraines with aura and cervicalgia with myofascial pain.Refilled: Dilaudid  4mg  1- tablet 4 hour as needed for severe pain. #12.    Continue  Aimovig . 10/26/2023 2. 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 Scheduled for  Lumbar ESI with Dr Carilyn, in the past she had Lumbar ESI on 02/06/2023 with 80% of relief noted. Schedule ESI with Dr Carilyn.  Continue with HEP as tolerated. and Heat therapy. 10/26/2023 Refilled: Oxycodone  7.5 mg one tablet every 6 hours as needed #135.We will continue the opioid monitoring program, this consists of regular clinic visits, examinations, urine drug screen, pill counts as well as use of  [place]   Controlled Substance Reporting system. A 12 month History has been reviewed on the [place]  Controlled Substance Reporting System on 10/26/2023. Second script sent for the following month.  3.  Primary Bilateral Osteoarthritis of the left knee greater than right with meniscal. Chondromalacia patellae changes.Continue to monitor. Continue HEP as Tolerated. Continue to Monitor. 10/26/2023. 4. Left elbow pain: No complaints today. Ortho Following. S/P surgery on  01/27/2019. Continue to monitor. 10/26/2023. 5. Chronic Bilateral Shoulder pain:  Continue HEP as Tolerated. Continue to Monitor. 10/26/2023 6. Sacral Pain: No complaints today. Continue HEP as Tolerated. Continue to Monitor. Continue current medication regimen. 10/26/2023 7. Cervicalgia:/ Cervical Radiculitis . Continue HEP as tolerated. Continue to monitor. 10/26/2023 8. Chronic Pain syndrome: Continue etodolac . In past she was prescribed Diclofenac, Voltaren gel and Ibuprofen, all ineffective. 10/26/2023 F/U in 2 months

## 2024-01-01 ENCOUNTER — Encounter: Admitting: Physical Medicine & Rehabilitation

## 2024-02-14 ENCOUNTER — Other Ambulatory Visit: Payer: Self-pay | Admitting: Medical Genetics

## 2024-02-14 DIAGNOSIS — Z006 Encounter for examination for normal comparison and control in clinical research program: Secondary | ICD-10-CM

## 2024-02-20 ENCOUNTER — Encounter: Attending: Registered Nurse | Admitting: Registered Nurse

## 2024-02-20 ENCOUNTER — Encounter: Payer: Self-pay | Admitting: Registered Nurse

## 2024-02-20 VITALS — BP 143/95 | HR 92 | Ht 69.0 in | Wt >= 6400 oz

## 2024-02-20 DIAGNOSIS — M4727 Other spondylosis with radiculopathy, lumbosacral region: Secondary | ICD-10-CM | POA: Insufficient documentation

## 2024-02-20 DIAGNOSIS — M7918 Myalgia, other site: Secondary | ICD-10-CM | POA: Diagnosis present

## 2024-02-20 DIAGNOSIS — M25511 Pain in right shoulder: Secondary | ICD-10-CM | POA: Diagnosis present

## 2024-02-20 DIAGNOSIS — M797 Fibromyalgia: Secondary | ICD-10-CM | POA: Diagnosis present

## 2024-02-20 DIAGNOSIS — G894 Chronic pain syndrome: Secondary | ICD-10-CM | POA: Diagnosis present

## 2024-02-20 DIAGNOSIS — M546 Pain in thoracic spine: Secondary | ICD-10-CM | POA: Insufficient documentation

## 2024-02-20 DIAGNOSIS — M170 Bilateral primary osteoarthritis of knee: Secondary | ICD-10-CM | POA: Diagnosis present

## 2024-02-20 DIAGNOSIS — M25512 Pain in left shoulder: Secondary | ICD-10-CM | POA: Diagnosis present

## 2024-02-20 DIAGNOSIS — G8929 Other chronic pain: Secondary | ICD-10-CM | POA: Diagnosis present

## 2024-02-20 MED ORDER — HYDROMORPHONE HCL 4 MG PO TABS: 4.0000 mg | ORAL_TABLET | ORAL | 0 refills | Status: DC | PRN

## 2024-02-20 MED ORDER — OXYCODONE-ACETAMINOPHEN 7.5-325 MG PO TABS: 1.0000 | ORAL_TABLET | Freq: Every day | ORAL | 0 refills | Status: AC | PRN

## 2024-02-20 MED ORDER — HYDROMORPHONE HCL 4 MG PO TABS: 4.0000 mg | ORAL_TABLET | ORAL | 0 refills | Status: AC | PRN

## 2024-02-20 MED ORDER — ETODOLAC 400 MG PO TABS: 400.0000 mg | ORAL_TABLET | Freq: Two times a day (BID) | ORAL | 2 refills | Status: AC | PRN

## 2024-02-20 MED ORDER — OXYCODONE-ACETAMINOPHEN 7.5-325 MG PO TABS: 1.0000 | ORAL_TABLET | Freq: Every day | ORAL | 0 refills | Status: DC | PRN

## 2024-02-20 MED ORDER — TIZANIDINE HCL 4 MG PO TABS: 4.0000 mg | ORAL_TABLET | Freq: Three times a day (TID) | ORAL | 5 refills | Status: AC | PRN

## 2024-02-20 NOTE — Progress Notes (Signed)
 "  Subjective:    Patient ID: Shannon Meadows, female    DOB: 1970/04/08, 54 y.o.   MRN: [id number]  HPI: Shannon Meadows is a 54 y.o. female who returns for follow up appointment for chronic pain and medication refill. She states her pain is located in her bilateral shoulders, mid- lower back radiating into her right lower extremity and bilateral knee pain. She also reports she has a headache, onset 90 minutes ago, she repots she took her medication and reports she has generalized Fibro pain. She  rates her pain 8. Her current exercise regime is walking short distances with her walker.   Ms. Robleto Morphine equivalent is 136.25 MME.   Last Oral Swab was Performed on 10/26/2023, it was consistent.    Pain Inventory Average Pain 7 Pain Right Now 8 My pain is intermittent, constant, sharp, burning, dull, stabbing, tingling, and aching  In the last 24 hours, has pain interfered with the following? General activity 9 Relation with others 7 Enjoyment of life 7 What TIME of day is your pain at its worst? morning  and daytime Sleep (in general) Fair  Pain is worse with: walking, bending, sitting, inactivity, standing, and some activites Pain improves with: rest, heat/ice, pacing activities, medication, and TENS Relief from Meds: 5  Family History  Problem Relation Age of Onset   Hyperlipidemia Father    Hypertension Father    Arthritis Father    Heart disease Father    Asthma Mother    Diabetes Mother    Arthritis Mother    Heart disease Mother    Heart disease Other    Lung disease Other    Diabetes Other    Hypertension Other    Social History   Socioeconomic History   Marital status: Significant Other    Spouse name: Not on file   Number of children: Not on file   Years of education: Not on file   Highest education level: Not on file  Occupational History   Not on file  Tobacco Use   Smoking status: Never   Smokeless tobacco: Never  Vaping Use   Vaping status: Never Used   Substance and Sexual Activity   Alcohol  use: Not Currently   Drug use: Never   Sexual activity: Not Currently  Other Topics Concern   Not on file  Social History Narrative   Not on file   Social Drivers of Health   Tobacco Use: Medium Risk (12/26/2023)   Received from [facility]   Patient History    Smoking Tobacco Use: Former    Smokeless Tobacco Use: Never    Passive Exposure: Not on [place]uary Strain: Not on file  Food Insecurity: Medium Risk (12/25/2023)   Received from [facility]   Epic    Within the past 12 months, you worried that your food would run out before you got money to buy more: Sometimes true    Within the past 12 months, the food you bought just didn't last and you didn't have money to get more. : Sometimes true  Transportation Needs: No Transportation Needs (12/25/2023)   Received from [facility]    In the past 12 months, has lack of reliable transportation kept you from medical appointments, meetings, work or from getting things needed for daily living? : No  Physical Activity: Not on file  Stress: Not on file  Social Connections: Unknown (06/03/2021)   Received from [facility]  Social Network    Social Network: Not on file  Depression [phone]): Low Risk (10/26/2023)   Depression (PHQ2-9)    PHQ-2 Score: 0  Alcohol  Screen: Not on file  Housing: Medium Risk (12/25/2023)   Received from [facility]   Epic    What is your living situation today?: I have a place to live today, but I am worried about losing it in the future    Think about the place you live. Do you have problems with any of the following? Choose all that apply:: None/None on this list  Utilities: Low Risk (12/25/2023)   Received from [facility]   Utilities    In the past 12 months has the electric, gas, oil, or water company threatened to shut off services in your home? : No  Health Literacy: Not on file   Past Surgical History:  Procedure  Laterality Date   KNEE ARTHROSCOPY     LAPOROSCOPIC SURGERY     UTEREINE   ULNAR COLLATERAL LIGAMENT RECONSTRUCTION     Past Surgical History:  Procedure Laterality Date   KNEE ARTHROSCOPY     LAPOROSCOPIC SURGERY     UTEREINE   ULNAR COLLATERAL LIGAMENT RECONSTRUCTION     Past Medical History:  Diagnosis Date   Back pain    Cervicalgia    Chronic headaches    Chronic pain syndrome    Endometriosis    Facet syndrome, lumbar    Hyperlipidemia    Hypertension    Lumbar sprain    Migraines    Myofascial pain    Neck pain    TMJ syndrome    BP (!) 138/102   Pulse 92   Ht 5' 9 (1.753 m)   Wt (!) 400 lb (181.4 kg)   BMI 59.07 kg/m   Opioid Risk Score:   Fall Risk Score:  `1  Depression screen [facility] 2/9     10/26/2023   10:24 AM 06/29/2023    9:32 AM 02/06/2023    9:39 AM 01/12/2023    9:18 AM 11/13/2022    8:22 AM 05/26/2022    8:22 AM 03/29/2022    8:11 AM  Depression screen PHQ 2/9  Decreased Interest 0 0 0 0 0 1 0  Down, Depressed, Hopeless 0 0 2 0 0 1 0  PHQ - 2 Score 0 0 2 0 0 2 0  Altered sleeping 0 0 1      Tired, decreased energy 0 0 1      Change in appetite 0 0 3      Feeling bad or failure about yourself  0 0 1      Trouble concentrating 0 0 0      Moving slowly or fidgety/restless 0 0 0      Suicidal thoughts 0 0 0      PHQ-9 Score 0  0  8       Difficult doing work/chores Very difficult Not difficult at all Not difficult at all         Data saved with a previous flowsheet row definition      Review of Systems  Musculoskeletal:  Positive for back pain.       Knee pain   All other systems reviewed and are negative.      Objective:   Physical Exam Vitals and nursing note reviewed.  Constitutional:      Appearance: Normal appearance. She is obese.  Cardiovascular:     Rate and Rhythm: Normal  rate and regular rhythm.     Pulses: Normal pulses.     Heart sounds: Normal heart sounds.  Pulmonary:     Effort: Pulmonary effort is normal.      Breath sounds: Normal breath sounds.  Musculoskeletal:     Comments: Normal Muscle Bulk and Muscle Testing Reveals:  Upper Extremities: Full ROM and Muscle Strength 5/5 Bilateral AC Joint Tenderness  Lumbar Paraspinal Tenderness: L-3-L-5 Lower Extremities: Full ROM and Muscle Strength 5/5 Arises from Table slowly using walker for support Antalgic  Gait     Skin:    General: Skin is warm and dry.  Neurological:     Mental Status: She is alert and oriented to person, place, and time.  Psychiatric:        Mood and Affect: Mood normal.        Behavior: Behavior normal.          Assessment & Plan:  1.  History of chronic migraines with aura and cervicalgia with myofascial pain.Refilled: Dilaudid  4mg  1- tablet 4 hour as needed for severe pain. #12.    Continue  Aimovig . 02/20/2024 2. 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 Scheduled for  Lumbar ESI with Dr Carilyn, in the past she had Lumbar ESI on 02/06/2023 with 80% of relief noted. Schedule ESI with Dr Carilyn.  Continue with HEP as tolerated. and Heat therapy. 02/20/2024 Refilled: Oxycodone  7.5 mg one tablet every 6 hours as needed #135 and Hydromorphone   4 mg one tablet  every 4 hours as needed for severe pain #12. .We will continue the opioid monitoring program, this consists of regular clinic visits, examinations, urine drug screen, pill counts as well as use of [place]  Controlled Substance Reporting system. A 12 month History has been reviewed on the [place]  Controlled Substance Reporting System on 02/20/2024. Second script sent for the following month.  3.  Primary Bilateral Osteoarthritis of the left knee greater than right with meniscal. Chondromalacia patellae changes.Continue to monitor. Continue HEP as Tolerated. Continue to Monitor. 02/20/2024. 4. Left elbow pain: No complaints today. Ortho Following. S/P surgery on  01/27/2019. Continue to monitor. 02/20/2024. 5. Chronic Bilateral Shoulder  pain:  Continue HEP as Tolerated. Continue to Monitor. 02/20/2024 6. Sacral Pain: No complaints today. Continue HEP as Tolerated. Continue to Monitor. Continue current medication regimen. 02/20/2024 7. Cervicalgia:/ Cervical Radiculitis .No complaints today.  Continue HEP as tolerated. Continue to monitor. 02/20/2024 8. Chronic Pain syndrome: Continue etodolac . In past she was prescribed Diclofenac, Voltaren gel and Ibuprofen, all ineffective. 02/20/2024   F/U in 2 months         "

## 2024-02-21 ENCOUNTER — Telehealth: Payer: Self-pay

## 2024-02-21 ENCOUNTER — Encounter: Admitting: Physical Medicine & Rehabilitation

## 2024-02-21 NOTE — Telephone Encounter (Signed)
" °  Right L5-S1  Therapeutic  Non-Authorized Criteria Not Met   Clinical Rationale: We were told that you have leg pain. Your doctor wants to give you an injection into your lower back to treat your pain. This procedure can be done when certain criteria are met. We reviewed the notes we received. The notes showed you met the criteria. The notes also show that the same procedure had been approved recently. Your doctor has had approval to do the injection. The notes do not show if the previously approved procedure was done or not. For this reason, this requested procedure is not medically necessary. We used [employer] Medical Benefits Management Clinical Guideline titled Interventional Pain Management, General Clinical Guideline - Repeat Therapeutic Intervention to make this decision. You may view this guideline at [email or web address]/mbm-guidelines-musculoskeletal. "

## 2024-02-27 ENCOUNTER — Encounter: Payer: Self-pay | Admitting: *Deleted

## 2024-02-29 ENCOUNTER — Encounter: Admitting: Physical Medicine & Rehabilitation

## 2024-04-14 ENCOUNTER — Encounter: Admitting: Registered Nurse
